# Patient Record
Sex: Female | Born: 1950
Health system: Southern US, Community
[De-identification: ages and names within clinical notes are randomized; demographics above are authoritative.]

## PROBLEM LIST (undated history)

## (undated) DIAGNOSIS — Z8719 Personal history of other diseases of the digestive system: Secondary | ICD-10-CM

## (undated) DIAGNOSIS — M1990 Unspecified osteoarthritis, unspecified site: Secondary | ICD-10-CM

## (undated) DIAGNOSIS — K219 Gastro-esophageal reflux disease without esophagitis: Secondary | ICD-10-CM

## (undated) DIAGNOSIS — R519 Headache, unspecified: Secondary | ICD-10-CM

## (undated) DIAGNOSIS — F419 Anxiety disorder, unspecified: Secondary | ICD-10-CM

## (undated) HISTORY — PX: OTHER SURGICAL HISTORY: SHX169

## (undated) HISTORY — PX: JOINT REPLACEMENT: SHX530

## (undated) HISTORY — PX: TOTAL HIP ARTHROPLASTY: SHX124

---

## 2003-07-11 ENCOUNTER — Inpatient Hospital Stay (HOSPITAL_COMMUNITY): Admission: RE | Admit: 2003-07-11 | Discharge: 2003-07-15 | Payer: Self-pay | Admitting: Orthopedic Surgery

## 2005-03-05 ENCOUNTER — Encounter: Admission: RE | Admit: 2005-03-05 | Discharge: 2005-03-05 | Payer: Self-pay | Admitting: Internal Medicine

## 2006-01-29 ENCOUNTER — Inpatient Hospital Stay (HOSPITAL_COMMUNITY): Admission: RE | Admit: 2006-01-29 | Discharge: 2006-02-01 | Payer: Self-pay | Admitting: Orthopedic Surgery

## 2008-08-03 ENCOUNTER — Encounter: Admission: RE | Admit: 2008-08-03 | Discharge: 2008-08-03 | Payer: Self-pay | Admitting: Unknown Physician Specialty

## 2008-10-27 ENCOUNTER — Emergency Department (HOSPITAL_COMMUNITY): Admission: EM | Admit: 2008-10-27 | Discharge: 2008-10-27 | Payer: Self-pay | Admitting: Emergency Medicine

## 2009-12-13 ENCOUNTER — Encounter: Admission: RE | Admit: 2009-12-13 | Discharge: 2009-12-13 | Payer: Self-pay | Admitting: Internal Medicine

## 2009-12-13 ENCOUNTER — Encounter (INDEPENDENT_AMBULATORY_CARE_PROVIDER_SITE_OTHER): Payer: Self-pay | Admitting: *Deleted

## 2010-01-03 ENCOUNTER — Encounter: Payer: Self-pay | Admitting: Gastroenterology

## 2010-01-04 ENCOUNTER — Ambulatory Visit: Payer: Self-pay | Admitting: Gastroenterology

## 2010-01-04 ENCOUNTER — Encounter (INDEPENDENT_AMBULATORY_CARE_PROVIDER_SITE_OTHER): Payer: Self-pay | Admitting: *Deleted

## 2010-01-04 DIAGNOSIS — R079 Chest pain, unspecified: Secondary | ICD-10-CM | POA: Insufficient documentation

## 2010-01-04 DIAGNOSIS — F329 Major depressive disorder, single episode, unspecified: Secondary | ICD-10-CM

## 2010-01-04 DIAGNOSIS — Z8739 Personal history of other diseases of the musculoskeletal system and connective tissue: Secondary | ICD-10-CM

## 2010-01-04 DIAGNOSIS — K5900 Constipation, unspecified: Secondary | ICD-10-CM | POA: Insufficient documentation

## 2010-01-04 DIAGNOSIS — K219 Gastro-esophageal reflux disease without esophagitis: Secondary | ICD-10-CM

## 2010-02-12 ENCOUNTER — Ambulatory Visit: Payer: Self-pay | Admitting: Gastroenterology

## 2010-02-12 DIAGNOSIS — K449 Diaphragmatic hernia without obstruction or gangrene: Secondary | ICD-10-CM | POA: Insufficient documentation

## 2010-02-12 LAB — CONVERTED CEMR LAB: UREASE: NEGATIVE

## 2010-02-21 ENCOUNTER — Telehealth: Payer: Self-pay | Admitting: Gastroenterology

## 2010-03-16 ENCOUNTER — Ambulatory Visit: Payer: Self-pay | Admitting: Gastroenterology

## 2010-03-16 DIAGNOSIS — K589 Irritable bowel syndrome without diarrhea: Secondary | ICD-10-CM

## 2010-06-04 ENCOUNTER — Encounter: Payer: Self-pay | Admitting: Gastroenterology

## 2010-07-31 NOTE — Miscellaneous (Signed)
Summary: Orders Update-Clotest  Clinical Lists Changes  Problems: Added new problem of HIATAL HERNIA (ICD-553.3) Orders: Added new Test order of TLB-H Pylori Screen Gastric Biopsy (83013-CLOTEST) - Signed 

## 2010-07-31 NOTE — Procedures (Signed)
Summary: Colonoscopy  Patient: [name] Note: All result statuses are Final unless otherwise noted.  Tests: (1) Colonoscopy (COL)   COL Colonoscopy           DONE     [facility]     [address]     [place], [place]  [address]           COLONOSCOPY PROCEDURE REPORT           PATIENT:  Melinda Thompson, Melinda Thompson  MR#:  [id number]     BIRTHDATE:  [birth year], 58 yrs. old  GENDER:  female     ENDOSCOPIST:  Vania Rea. Jarold Motto, MD, [facility]     REF. BY:  Guerry Bruin, M.D.     PROCEDURE DATE:  02/12/2010     PROCEDURE:  Higher-risk screening colonoscopy G0105           ASA CLASS:  Class II     INDICATIONS:  family Hx of polyps, Elevated Risk Screening     MEDICATIONS:   Fentanyl 75 mcg IV, Versed 8 mg IV           DESCRIPTION OF PROCEDURE:   After the risks benefits and     alternatives of the procedure were thoroughly explained, informed     consent was obtained.  Digital rectal exam was performed and     revealed no abnormalities.   The LB CF-H180AL [id number] endoscope     was introduced through the anus and advanced to the cecum, which     was identified by both the appendix and ileocecal valve, without     limitations.  The quality of the prep was excellent, using     MoviPrep.  The instrument was then slowly withdrawn as the colon     was fully examined.     <<PROCEDUREIMAGES>>           FINDINGS:  No polyps or cancers were seen.  no active bleeding or     blood in c.  This was otherwise a normal examination of the colon.     Retroflexed views in the rectum revealed no abnormalities.    The     scope was then withdrawn from the patient and the procedure     completed.           COMPLICATIONS:  None     ENDOSCOPIC IMPRESSION:     1) No polyps or cancers     2) No active bleeding or blood in c     3) Otherwise normal examination     RECOMMENDATIONS:     1) Continue current colorectal screening recommendations for     "routine risk" patients with a repeat colonoscopy in  10 years.     REPEAT EXAM:  No           ______________________________     Vania Rea. Jarold Motto, MD, [facility]           CC:           n.     eSIGNED:   Vania Rea. Patterson at 02/12/2010 11:23 AM           Johna Sheriff, [id number]  Note: An exclamation mark (!) indicates a result that was not dispersed into the flowsheet. Document Creation Date: 02/12/2010 11:24 AM _______________________________________________________________________  (1) Order result status: Final Collection or observation date-time: 02/12/2010 11:18 Requested date-time:  Receipt date-time:  Reported date-time:  Referring Physician:   Ordering Physician: Sheryn Bison [phone]) Specimen  Source:  Source: Launa Grill Order Number: [id number] Lab site:   Appended Document: Colonoscopy    Clinical Lists Changes  Observations: Added new observation of COLONNXTDUE: 01/2020 (02/12/2010 15:20)      Appended Document: Colonoscopy     Procedures Next Due Date:    Colonoscopy: 01/2020

## 2010-07-31 NOTE — Assessment & Plan Note (Signed)
Summary: f/u--ch.   History of Present Illness Visit Type: Follow-up Visit Primary GI MD: Sheryn Bison MD Faith Rogue Primary Skya Mccullum: Sarita Haver Requesting Breyona Swander: n/a Chief Complaint: Pt here to today to follow up, does not have any GI complaints at this time, Pt states she is trying to eat better History of Present Illness:   This Patient has acid reflux and is asymptomatic on omeprazole 20 mg a day. We reviewed her recent endoscopy. She does have chronic IBS and uses p.r.n. Levsin.She had a normal ultrasound exam in July of this year, and had a recent normal endoscopy and colonoscopy is set for moderate-sized hiatal hernia.   GI Review of Systems      Denies abdominal pain, acid reflux, belching, bloating, chest pain, dysphagia with liquids, dysphagia with solids, heartburn, loss of appetite, nausea, vomiting, vomiting blood, weight loss, and  weight gain.        Denies anal fissure, black tarry stools, change in bowel habit, constipation, diarrhea, diverticulosis, fecal incontinence, heme positive stool, hemorrhoids, irritable bowel syndrome, jaundice, light color stool, liver problems, rectal bleeding, and  rectal pain.    Current Medications (verified): 1)  Zoloft 50 Mg Tabs (Sertraline Hcl) .Marland Kitchen.. 1 Qd 2)  Sumatriptan Succinate 100 Mg Tabs (Sumatriptan Succinate) .... Prn 3)  Clonazepam 0.5 Mg Tbdp (Clonazepam) .... Two Times A Day Prn 4)  Maxalt 5 Mg Tabs (Rizatriptan Benzoate) .... Prn 5)  Multivitamins  Tabs (Multiple Vitamin) .... Qd 6)  Omeprazole 20 Mg Cpdr (Omeprazole) .Marland Kitchen.. 1 By Mouth Two Times A Day 7)  Levsin/sl 0.125 Mg  Subl (Hyoscyamine Sulfate) .Marland Kitchen.. 1 Sl Q 4-6 Hrs As Needed  Allergies (verified): 1)  ! Sulfa  Past History:  Family History: Last updated: 01/04/2010 Family History of Colon Polyps:father No FH of Colon Cancer: Family History of Colitis/Crohn's:father Family History of Heart Disease:  father  Social History: Last updated:  01/04/2010 Occupation: Chartered loss adjuster single Patient has never smoked.  Daily Caffeine Use  2 cups Illicit Drug Use - no  Past medical, surgical, family and social histories (including risk factors) reviewed for relevance to current acute and chronic problems.  Past Medical History: Reviewed history from 01/04/2010 and no changes required. Arthritis Migraine Headaches Anxiety Disorder Depression Urinary Tract Infection  Past Surgical History: Reviewed history from 01/04/2010 and no changes required. Hip Replacement  2 surgery  Family History: Reviewed history from 01/04/2010 and no changes required. Family History of Colon Polyps:father No FH of Colon Cancer: Family History of Colitis/Crohn's:father Family History of Heart Disease:  father  Social History: Reviewed history from 01/04/2010 and no changes required. 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  Daily Caffeine Use  2 cups Illicit Drug Use - no  Review of Systems  The patient denies allergy/sinus, anemia, anxiety-new, arthritis/joint pain, back pain, blood in urine, breast changes/lumps, change in vision, confusion, cough, coughing up blood, depression-new, fainting, fatigue, fever, headaches-new, hearing problems, heart murmur, heart rhythm changes, itching, menstrual pain, muscle pains/cramps, night sweats, nosebleeds, pregnancy symptoms, shortness of breath, skin rash, sleeping problems, sore throat, swelling of feet/legs, swollen lymph glands, thirst - excessive , urination - excessive , urination changes/pain, urine leakage, vision changes, and voice change.    Vital Signs:  Patient profile:   60 year old female Height:      67 inches Weight:      201 pounds BMI:     31.59 BSA:     2.03 Pulse rate:   80 / minute  Pulse rhythm:   regular BP sitting:   120 / 82  (left arm)  Vitals Entered By: Merri Ray CMA Duncan Dull) (March 16, 2010 10:40 AM)  Physical Exam  General:  Well  developed, well nourished, no acute distress.healthy appearing.   Head:  Normocephalic and atraumatic. Eyes:  PERRLA, no icterus.exam deferred to patient's ophthalmologist.   Abdomen:  Soft, nontender and nondistended. No masses, hepatosplenomegaly or hernias noted. Normal bowel sounds. Extremities:  No clubbing, cyanosis, edema or deformities noted. Neurologic:  Alert and  oriented x4;  grossly normal neurologically. Psych:  Alert and cooperative. Normal mood and affect.   Impression & Recommendations:  Problem # 1:  GERD (ICD-530.81) Assessment Improved Reflux regime reviewed and will continue omeprazole 20 mg one to 2 times a day as needed to control her symptomatology. Orders: TLB-IgA (Immunoglobulin A) (82784-IGA) T-Sprue Panel (Celiac Disease Aby Eval) (83516x3/[address])  Problem # 2:  IRRITABLE BOWEL SYNDROME (ICD-564.1) Assessment: Improved Continue p.r.n. Levsin. Celiac antibodies have been ordered. She Had Been Advised to avoid sorbitol and fructose in her diet  Patient Instructions: 1)  Copy sent to : Richard Tisovec,MD 2)  Please schedule a follow-up appointment as needed.  3)  Please continue current medications.  4)  Please go to the basement today for your labs.  5)  The medication list was reviewed and reconciled.  All changed / newly prescribed medications were explained.  A complete medication list was provided to the patient / caregiver.

## 2010-07-31 NOTE — Letter (Signed)
Summary: [facility] Instructions  [facility]  [address] [place], [place] [address]   Phone: [phone]  Fax: [phone]       Melinda Thompson    [birth year]    MRN: [id number]        Procedure Day /Date: Monday,  02/12/10     Arrival Time: 10:00      Procedure Time: 11:00     Location of Procedure:                    [name] (4th Floor)                        PREPARATION FOR COLONOSCOPY WITH MOVIPREP   Starting 5 days prior to your procedure 02/07/10 do not eat nuts, seeds, popcorn, corn, beans, peas,  salads, or any raw vegetables.  Do not take any fiber supplements (e.g. Metamucil, Citrucel, and Benefiber).  THE DAY BEFORE YOUR PROCEDURE         DATE: 02/11/10    DAY: Sunday  1.  Drink clear liquids the entire day-NO SOLID FOOD  2.  Do not drink anything colored red or purple.  Avoid juices with pulp.  No orange juice.  3.  Drink at least 64 oz. (8 glasses) of fluid/clear liquids during the day to prevent dehydration and help the prep work efficiently.  CLEAR LIQUIDS INCLUDE: Water Jello Ice Popsicles Tea (sugar ok, no milk/cream) Powdered fruit flavored drinks Coffee (sugar ok, no milk/cream) Gatorade Juice: apple, white grape, white cranberry  Lemonade Clear bullion, consomm, broth Carbonated beverages (any kind) Strained chicken noodle soup Hard Candy                             4.  In the morning, mix first dose of MoviPrep solution:    Empty 1 Pouch A and 1 Pouch B into the disposable container    Add lukewarm drinking water to the top line of the container. Mix to dissolve    Refrigerate (mixed solution should be used within 24 hrs)  5.  Begin drinking the prep at 5:00 p.m. The MoviPrep container is divided by 4 marks.   Every 15 minutes drink the solution down to the next mark (approximately 8 oz) until the full liter is complete.   6.  Follow completed prep with 16 oz of clear liquid of your choice (Nothing  red or purple).  Continue to drink clear liquids until bedtime.  7.  Before going to bed, mix second dose of MoviPrep solution:    Empty 1 Pouch A and 1 Pouch B into the disposable container    Add lukewarm drinking water to the top line of the container. Mix to dissolve    Refrigerate  THE DAY OF YOUR PROCEDURE      DATE: 02/12/10    DAY: Monday  Beginning at 6:00 a.m. (5 hours before procedure):         1. Every 15 minutes, drink the solution down to the next mark (approx 8 oz) until the full liter is complete.  2. Follow completed prep with 16 oz. of clear liquid of your choice.    3. You may drink clear liquids until 8:00 (2 HOURS BEFORE PROCEDURE).   MEDICATION INSTRUCTIONS  Unless otherwise instructed, you should take regular prescription medications with a small sip of water   as early as  possible the morning of your procedure.  .    Additional medication instructions: _         OTHER INSTRUCTIONS  You will need a responsible adult at least 59 years of age to accompany you and drive you home.   This person must remain in the waiting room during your procedure.  Wear loose fitting clothing that is easily removed.  Leave jewelry and other valuables at home.  However, you may wish to bring a book to read or  an iPod/MP3 player to listen to music as you wait for your procedure to start.  Remove all body piercing jewelry and leave at home.  Total time from sign-in until discharge is approximately 2-3 hours.  You should go home directly after your procedure and rest.  You can resume normal activities the  day after your procedure.  The day of your procedure you should not:   Drive   Make legal decisions   Operate machinery   Drink alcohol   Return to work  You will receive specific instructions about eating, activities and medications before you leave.    The above instructions have been reviewed and explained to me by    _______________________    I fully understand and can verbalize these instructions _____________________________ Date _________

## 2010-07-31 NOTE — Progress Notes (Signed)
Summary: Follow up appt  Phone Note Outgoing Call   Call placed by: Ashok Cordia RN,  February 21, 2010 11:56 AM Summary of Call: Does pt need follow up OV?  See procedure reports. Initial call taken by: Ashok Cordia RN,  February 21, 2010 11:56 AM  Follow-up for Phone Call        YES Follow-up by: Mardella Layman MD Clementeen Graham,  February 21, 2010 12:06 PM     Appended Document: Triage LM for pt to call to sch follow up appt in 2-3 weeks.  Appended Document: Triage LM for pt to call and sch appt.  2-3 week follow up  Appended Document: Follow up appt Letter sent asking pt to call for appt.

## 2010-07-31 NOTE — Medication Information (Signed)
Summary: Prior Authorization Approved for Omeprazole / [employer] [place]  Prior Authorization Approved for Omeprazole / [employer] [place]   Imported By: Lennie Odor 06/07/2010 16:06:50  _____________________________________________________________________  External Attachment:    Type:   Image     Comment:   External Document

## 2010-07-31 NOTE — Procedures (Signed)
Summary: Upper Endoscopy  Patient: Melinda Thompson Note: All result statuses are Final unless otherwise noted.  Tests: (1) Upper Endoscopy (EGD)   EGD Upper Endoscopy       DONE      [facility]     [address]     [place], [place]  [address]           ENDOSCOPY PROCEDURE REPORT           PATIENT:  [name], [name]  MR#:  [id number]     BIRTHDATE:  February 10, 1951, 58 yrs. old  GENDER:  female           ENDOSCOPIST:  Vania Rea. Jarold Motto, MD, [facility]     Referred by:  Guerry Bruin, M.D.           PROCEDURE DATE:  02/12/2010     PROCEDURE:  EGD with biopsy     ASA CLASS:  Class II     INDICATIONS:  chest pain           MEDICATIONS:   There was residual sedation effect present from     prior procedure., Versed 2 mg IV     TOPICAL ANESTHETIC:           DESCRIPTION OF PROCEDURE:   After the risks benefits and     alternatives of the procedure were thoroughly explained, informed     consent was obtained.  The LB GIF-H180 [id number] endoscope was     introduced through the mouth and advanced to the second portion of     the duodenum, without limitations.  The instrument was slowly     withdrawn as the mucosa was fully examined.     <<PROCEDUREIMAGES>>           A hiatal hernia was found. 5 CM PROMINANT HIATIAL HERNIA PRESENT.     Normal duodenal folds were noted.  The stomach was entered and     closely examined. The antrum, angularis, and lesser curvature were     well visualized, including a retroflexed view of the cardia and     fundus. The stomach wall was normally distensable. The scope     passed easily through the pylorus into the duodenum. CLO BX. DONE.     The esophagus and gastroesophageal junction were completely normal     in appearance. NO EROSIONS,STRICTURE,OR BARRETT'S MUCOSA NOTED.     Retroflexed views revealed a hiatal hernia.    The scope was then     withdrawn from the patient and the procedure completed.           COMPLICATIONS:  None           ENDOSCOPIC  IMPRESSION:     1) Hiatal hernia     2) Normal duodenal folds     3) Normal stomach     4) Normal esophagus     5) A hiatal hernia     CHRONIC GERD.PT NOW ON PPI RX.     RECOMMENDATIONS:     1) Anti-reflux regimen to be follow     2) Await pathology results     3) continue PPI           REPEAT EXAM:  No           ______________________________     Vania Rea. Jarold Motto, MD, [facility]           CC:           n.  eSIGNED:   Vania Rea. Patterson at 02/12/2010 11:34 AM           Johna Sheriff, [id number]  Note: An exclamation mark (!) indicates a result that was not dispersed into the flowsheet. Document Creation Date: 02/12/2010 11:35 AM _______________________________________________________________________  (1) Order result status: Final Collection or observation date-time: 02/12/2010 11:28 Requested date-time:  Receipt date-time:  Reported date-time:  Referring Physician:   Ordering Physician: Sheryn Bison [phone]) Specimen Source:  Source: Launa Grill Order Number: [id number] Lab site:

## 2010-07-31 NOTE — Assessment & Plan Note (Signed)
Summary: abd pain.Melinda Thompson   History of Present Illness Visit Type: Initial Consult Primary GI MD: Sheryn Bison MD FACP FAGA Primary Provider: Sarita Haver Requesting Provider: Sarita Haver Chief Complaint: Abdominal Pain History of Present Illness:   60 year old Caucasian female referred by Dr. Neale Burly for evaluation of recurrent substernal chest pain, gas, bloating, and chronic functional constipation.  Melinda Thompson has had years of acid reflux exacerbated by frequent NSAID use related to degenerative arthritis and 2 previous hip replacements. She has had worsening of her complaints since June of this year when apparently she enters on an alleged food poisoning of questionable etiology. Apparently, her son was hospitalized at  [facility]. Diagnosis apparently was unclear.  The patient has had chronic burning substernal chest pain and has been on PPI therapy for several months. She has no dysphagia or true hepatobiliary complaints. Recent lab work is pending, and ultrasound upper abdomen was unremarkable. She also has chronic headaches and uses p.r.n. Imitrex, clonazepam, Maxalt, and daily Zoloft. She denies current neurologic problems.  Her appetite is good and her weight is stable. She does describe some early satiety but has had no weight loss. She has chronic functional constipation with associated gas and bloating. Family history vaguely positive for possible colitis and possible polyposis in her father. The patient has not had previous endoscopy, colonoscopy, or barium studies. She does not smoke or abuse ethanol. She continues to take periodic Advil for hip pain.   GI Review of Systems    Reports abdominal pain, acid reflux, belching, bloating, chest pain, and  weight gain.      Denies dysphagia with liquids, dysphagia with solids, heartburn, loss of appetite, nausea, vomiting, vomiting blood, and  weight loss.      Reports change in bowel habits and  constipation.    Denies anal fissure, black tarry stools, diarrhea, diverticulosis, fecal incontinence, heme positive stool, hemorrhoids, irritable bowel syndrome, jaundice, light color stool, liver problems, rectal bleeding, and  rectal pain. Preventive Screening-Counseling & Management  Alcohol-Tobacco     Smoking Status: never      Drug Use:  no.      Current Medications (verified): 1)  Zoloft 50 Mg Tabs (Sertraline Hcl) .Melinda Kitchen.. 1 Qd 2)  Sumatriptan Succinate 100 Mg Tabs (Sumatriptan Succinate) .... Prn 3)  Clonazepam 0.5 Mg Tbdp (Clonazepam) .... Two Times A Day Prn 4)  Maxalt 5 Mg Tabs (Rizatriptan Benzoate) .... Prn 5)  Multivitamins  Tabs (Multiple Vitamin) .... Qd 6)  Omeprazole 20 Mg Cpdr (Omeprazole) .Melinda Kitchen.. 1 Qd  Allergies (verified): 1)  ! Sulfa  Past History:  Past medical, surgical, family and social histories (including risk factors) reviewed for relevance to current acute and chronic problems.  Past Medical History: Arthritis Migraine Headaches Anxiety Disorder Depression Urinary Tract Infection  Past Surgical History: Hip Replacement  2 surgery  Family History: Reviewed history and no changes required. Family History of Colon Polyps:father No FH of Colon Cancer: Family History of Colitis/Crohn's:father Family History of Heart Disease:  father  Social History: Reviewed history and no changes required. 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  Daily Caffeine Use  2 cups Illicit Drug Use - no Smoking Status:  never Drug Use:  no  Review of Systems       The patient complains of arthritis/joint pain, back pain, and headaches-new.  The patient denies allergy/sinus, anemia, anxiety-new, blood in urine, breast changes/lumps, change in vision, confusion, cough, coughing up blood, depression-new, fainting, fatigue, fever, hearing problems, heart murmur,  heart rhythm changes, itching, menstrual pain, muscle pains/cramps, night sweats, nosebleeds, pregnancy  symptoms, shortness of breath, skin rash, sleeping problems, sore throat, swelling of feet/legs, swollen lymph glands, thirst - excessive , urination - excessive , urination changes/pain, urine leakage, vision changes, and voice change.   General:  Denies fever, chills, sweats, anorexia, fatigue, weakness, malaise, weight loss, and sleep disorder. Eyes:  Denies blurring, diplopia, irritation, discharge, vision loss, scotoma, eye pain, and photophobia. ENT:  Denies earache, ear discharge, tinnitus, decreased hearing, nasal congestion, loss of smell, nosebleeds, sore throat, hoarseness, and difficulty swallowing. CV:  Denies chest pains, angina, palpitations, syncope, dyspnea on exertion, orthopnea, PND, peripheral edema, and claudication. Resp:  Denies dyspnea at rest, dyspnea with exercise, cough, sputum, wheezing, coughing up blood, and pleurisy. GI:  Complains of indigestion/heartburn, gas/bloating, and constipation; denies difficulty swallowing, pain on swallowing, nausea, vomiting, vomiting blood, abdominal pain, jaundice, diarrhea, change in bowel habits, bloody BM's, black BMs, and fecal incontinence. GU:  Denies urinary burning, blood in urine, nocturnal urination, urinary frequency, urinary incontinence, abnormal vaginal bleeding, amenorrhea, menorrhagia, vaginal discharge, pelvic pain, genital sores, painful intercourse, and decreased libido. MS:  Complains of joint pain / LOM, joint stiffness, and low back pain; denies joint swelling, joint deformity, muscle weakness, muscle cramps, muscle atrophy, leg pain at night, leg pain with exertion, and shoulder pain / LOM hand / wrist pain (CTS). Derm:  Denies rash, itching, dry skin, hives, moles, warts, and unhealing ulcers. Neuro:  Complains of frequent headaches; denies weakness, paralysis, abnormal sensation, seizures, syncope, tremors, vertigo, transient blindness, frequent falls, difficulty walking, headache, sciatica, radiculopathy other:,  restless legs, memory loss, and confusion. Psych:  Complains of depression and anxiety; denies memory loss, suicidal ideation, hallucinations, paranoia, phobia, and confusion. Heme:  Denies bruising, bleeding, enlarged lymph nodes, and pagophagia.  Vital Signs:  Patient profile:   60 year old female Height:      67 inches Weight:      200 pounds BMI:     31.44 BSA:     2.02 Pulse rate:   84 / minute Pulse rhythm:   regular BP sitting:   110 / 82  (left arm)  Vitals Entered By: Merri Ray CMA Duncan Dull) (January 04, 2010 9:06 AM)  Physical Exam  General:  Well developed, well nourished, no acute distress.healthy appearing.   Head:  Normocephalic and atraumatic. Eyes:  PERRLA, no icterus.exam deferred to patient's ophthalmologist.   Neck:  Supple; no masses or thyromegaly. Lungs:  Clear throughout to auscultation. Heart:  Regular rate and rhythm; no murmurs, rubs,  or bruits. Abdomen:  Soft, nontender and nondistended. No masses, hepatosplenomegaly or hernias noted. Normal bowel sounds. Rectal:  deferred until time of colonoscopy.   Msk:  Symmetrical with no gross deformities. Normal posture. Pulses:  Normal pulses noted. Extremities:  No clubbing, cyanosis, edema or deformities noted. Neurologic:  Alert and  oriented x4;  grossly normal neurologically. Cervical Nodes:  No significant cervical adenopathy. Psych:  Alert and cooperative. Normal mood and affect.   Impression & Recommendations:  Problem # 1:  CHEST PAIN (ICD-786.50) Assessment Improved Probable acute and chronic gastroesophageal reflux disease-rule out Barrett's mucosa. I have increased her omeprazole to b.i.d. dosage with standard antireflux maneuvers. Endoscopy is been scheduled her convenience.  Problem # 2:  SCREENING COLORECTAL-CANCER (ICD-V76.51) Assessment: Unchanged colonoscopy also scheduled at her convenience.  Problem # 3:  CONSTIPATION (ICD-564.00) Assessment: Unchanged She has constipation  predominant IBS which should respond to add fiber diet. Labs from  Dr.Tisovec are pending.  Problem # 4:  DEPRESSION (ICD-311) Assessment: Improved continue Zoloft 50 mg a day as tolerated  Problem # 5:  PERSONAL HISTORY OF ARTHRITIS (ICD-V13.4) Assessment: Unchanged Patient continues to use NSAIDs with some improvement in her arthritic complaints. She is followed by Dr. Lestine Mount in orthopedics.  Patient Instructions: 1)  Increase Omeprazole to two times a day. 2)  Begin Levsin as needed. 3)  You are scheduled for an endoscopy and a colonoscopy. 4)  The medication list was reviewed and reconciled.  All changed / newly prescribed medications were explained.  A complete medication list was provided to the patient / caregiver. 5)  Copy sent to : Dr. Roland Rack and Dr. Rolan Bucco in [place] [facility]. 6)  Please continue current medications.  7)  Avoid foods high in acid content ( tomatoes, citrus juices, spicy foods) . Avoid eating within 3 to 4 hours of lying down or before exercising. Do not over eat; try smaller more frequent meals. Elevate head of bed four inches when sleeping.  8)  High Fiber, Low Fat  Healthy Eating Plan brochure given.  9)  Constipation and Hemorrhoids brochure given.  10)  Colonoscopy and Flexible Sigmoidoscopy brochure given.  11)  Conscious Sedation brochure given.  12)  Upper Endoscopy brochure given.   Appended Document: abd pain.Melinda Thompson    Clinical Lists Changes  Medications: Added new medication of MOVIPREP 100 GM  SOLR (PEG-KCL-NACL-NASULF-NA ASC-C) As per prep instructions. - Signed Changed medication from OMEPRAZOLE 20 MG CPDR (OMEPRAZOLE) 1 qd to OMEPRAZOLE 20 MG CPDR (OMEPRAZOLE) 1 by mouth two times a day - Signed Added new medication of LEVSIN/SL 0.125 MG  SUBL (HYOSCYAMINE SULFATE) 1 SL q 4-6 hrs as needed - Signed Rx of MOVIPREP 100 GM  SOLR (PEG-KCL-NACL-NASULF-NA ASC-C) As per prep instructions.;  #1 x 0;  Signed;  Entered by: Ashok Cordia RN;  Authorized by: Mardella Layman MD [facility];  Method used: Electronically to [id number] [address] [place], [place]  [address], Ph: [phone] or [phone], Fax: [phone] Rx of OMEPRAZOLE 20 MG CPDR (OMEPRAZOLE) 1 by mouth two times a day;  #60 x 11;  Signed;  Entered by: Ashok Cordia RN;  Authorized by: Mardella Layman MD [facility];  Method used: Electronically to [facility] [address], [place], [place]  [address], Ph: [phone] or [phone], Fax: [phone] Rx of LEVSIN/SL 0.125 MG  SUBL (HYOSCYAMINE SULFATE) 1 SL q 4-6 hrs as needed;  #60 x 3;  Signed;  Entered by: Ashok Cordia RN;  Authorized by: Mardella Layman MD [facility];  Method used: Electronically to [facility] [address] [place], [place]  [address], Ph: [phone] or [phone], Fax: [phone] Orders: Added new Test order of Colon/Endo (Colon/Endo) - Signed    Prescriptions: LEVSIN/SL 0.125 MG  SUBL (HYOSCYAMINE SULFATE) 1 SL q 4-6 hrs as needed  #60 x 3   Entered by:   Ashok Cordia RN   Authorized by:   Mardella Layman MD [facility]   Signed by:   Ashok Cordia RN on 01/04/2010   Method used:   Electronically to        [facility] (retail)       [address]       [place], [place]  [address]       Ph: [phone] or [phone]       Fax: [phone]   RxID:   [id number] OMEPRAZOLE 20 MG CPDR (OMEPRAZOLE)  1 by mouth two times a day  #60 x 11   Entered by:   Ashok Cordia RN   Authorized by:   Mardella Layman MD [facility]   Signed by:   Ashok Cordia RN on 01/04/2010   Method used:   Electronically to        [facility] (retail)       [address]       [place], [place]  [address]       Ph: [phone] or [phone]       Fax: [phone]   RxID:   [id number] MOVIPREP 100 GM  SOLR (PEG-KCL-NACL-NASULF-NA ASC-C) As per prep instructions.  #1 x 0   Entered by:   Ashok Cordia RN   Authorized by:   Mardella Layman MD [facility]   Signed by:   Ashok Cordia RN on  01/04/2010   Method used:   Electronically to        [facility] (retail)       [address]       [place], [place]  [address]       Ph: [phone] or [phone]       Fax: [phone]   RxID:   [id number]

## 2010-10-10 LAB — CBC
Hemoglobin: 13.9 g/dL (ref 12.0–15.0)
MCHC: 34.2 g/dL (ref 30.0–36.0)
RDW: 13.6 % (ref 11.5–15.5)

## 2010-10-10 LAB — BASIC METABOLIC PANEL
CO2: 26 mEq/L (ref 19–32)
Calcium: 9.7 mg/dL (ref 8.4–10.5)
Creatinine, Ser: 0.7 mg/dL (ref 0.4–1.2)
Glucose, Bld: 119 mg/dL — ABNORMAL HIGH (ref 70–99)

## 2010-10-10 LAB — DIFFERENTIAL
Basophils Absolute: 0 10*3/uL (ref 0.0–0.1)
Basophils Relative: 0 % (ref 0–1)
Eosinophils Absolute: 0 10*3/uL (ref 0.0–0.7)
Monocytes Absolute: 0.3 10*3/uL (ref 0.1–1.0)
Neutro Abs: 7.3 10*3/uL (ref 1.7–7.7)
Neutrophils Relative %: 89 % — ABNORMAL HIGH (ref 43–77)

## 2010-11-16 NOTE — Op Note (Signed)
NAME:  Melinda Thompson, Melinda Thompson                         ACCOUNT NO.:  [id number]   MEDICAL RECORD NO.:  [id number]                   PATIENT TYPE:  INP   LOCATION:  0004                                 FACILITY:  [facility]   PHYSICIAN:  Ollen Gross, M.D.                 DATE OF BIRTH:  [birth year]   DATE OF PROCEDURE:  07/11/2003  DATE OF DISCHARGE:                                 OPERATIVE REPORT   PREOPERATIVE DIAGNOSIS:  Osteoarthritis/dysplasia, right hip.   POSTOPERATIVE DIAGNOSIS:  Osteoarthritis/dysplasia, right hip.   PROCEDURE:  Right total hip arthroplasty.   SURGEON:  Ollen Gross, M.D.   ASSISTANT:  Alexzandrew L. Julien Girt, P.A.   ANESTHESIA:  General.   ESTIMATED BLOOD LOSS:  800.   DRAINS:  Hemovac x1.   COMPLICATIONS:  __________.   BRIEF CLINICAL NOTE:  Melinda Thompson is a 61 year old female with end-stage  arthritic changes of her right hip.  The underlying abnormality is  dysplasia.  She is bone-against-bone with a shallow acetabular socket.  She  has had pain refractory to nonoperative management and presents now for a  total hip arthroplasty.   PROCEDURE IN DETAIL:  After successful administration of general anesthetic,  the patient is placed in the left lateral decubitus position with the right  side up and held with the hip positioner.  The right lower extremity was  isolated from her perineum with plastic drapes and prepped and draped in the  usual sterile fashion.  A short posterolateral incision was made with a 10  blade through subcutaneous tissue to the level of the fascia lata, which was  incised in line with the skin incision.  Sciatic nerve was palpated and  protected and short rotators isolated off the femur.  A capsulectomy was  performed and the hip dislocated.  The center of the femoral head was  marked, and a trial prosthesis was placed at the center of the trial head to  lie slightly below the center of her native femoral head.  I did it this way  because  she had everted superiorly, and when I brought the acetabular center  down, I did not want to overlengthen her.  The femoral neck was cut and  femoral head removed.  The femurs were retracted anteriorly to gain  acetabular exposure.   Acetabular reaming was initiated with a 47 coursing increments up to a 55.  She had two large superior cysts, which were curetted out, and some of the  reamings of cancellous bone were placed into those cysts.  The 56 mm  Pinnacle acetabular shell was placed into anatomic position with  approximately 40 degrees of anteversion, 20 degrees of forward flexion.  We  had excellent press fit, and I also used two dome screws for additional  purchase.  A trial 36 mm neutral liner was placed.   The femur was prepared, first with a canal finder and then irrigation.  Axial  reaming was performed to 13.5 mm and proximal reaming to an 27F and the  sleeving machine to a small.  27F small trial sleeve was placed with an 18 x  13 stem, first a 36 standard neck, matching her native anteversion.  With a  36 standard, she did not have enough offset.  I went to a 36 + 8, and she  had much more appropriate soft tissue tension.  With the 36 + 8 and a 36 + 0  head, there was still a little bit of laxity, so I went to a 36 + 3, which  corrected that.  She had excellent stability with full extension and full  external rotation, 70 degrees flexion and 40 degrees adduction, 90 degrees  internal rotation and 90 degrees flexion, 90 degrees internal rotation.  I  could also flex her up to about 120 degrees.  By placing the right leg on  top of the left, her leg lengths were equal.  The trials were then removed,  and the permanent apex hole eliminator placed into the acetabular shell.  Permanent 36 mm neutral Ultramet narrow liner was placed into the shell.  This was a metal-on-metal hip replacement. 27F small sleeve and 18 x 13 stem  and a 36 + 8 neck were placed, and I matched her native  anteversion.  Once  the stem was fully impacted, then the trial 36 + 3 was placed and I liked  the stability with that.  The permanent 36 + 3 was placed, and the same  stability parameters were present as with the trials.  The wound was then  copiously irrigated with antibiotic solution, and rotators were reattached  to the femur through drill holes with Ethibond suture.  The fascia lata was  closed over a Hemovac drain with interrupted #1 Vicryl subcu closed with #1,  and then 2-0 Vicryl, and then subcuticular running 4-0 Monocryl.  The  incision was clean and dry, and Steri-Strips and a bulky sterile dressing  applied.  Subsequently awakened and transported to recovery in a stable  condition.                                               Ollen Gross, M.D.    FA/MEDQ  D:  07/11/2003  T:  07/11/2003  Job:  [id number]

## 2010-11-16 NOTE — Discharge Summary (Signed)
NAME:  Melinda Thompson, Melinda Thompson               ACCOUNT NO.:  [id number]   MEDICAL RECORD NO.:  [id number]          PATIENT TYPE:  INP   LOCATION:  1506                         FACILITY:  [facility]   PHYSICIAN:  Ollen Gross, M.D.    DATE OF BIRTH:  [birth year]   DATE OF ADMISSION:  01/29/2006  DATE OF DISCHARGE:  02/01/2006                                 DISCHARGE SUMMARY   ADMISSION DIAGNOSIS:  1. Osteoarthritis of the left hip.  2. History of migraines.  3. History of bronchitis.  4. Hiatal hernia.  5. History of urinary tract infections.  6. Status post right total hip January 2005.   DISCHARGE DIAGNOSIS:  1. Osteoarthritis of the left hip status post left total hip arthroplasty.  2. Postoperative hypokalemia, improving.  3. History of migraines.  4. History of bronchitis.  5. Hiatal hernia.  6. History of urinary tract infections.  7. Status post right total hip January 2005.   PROCEDURE:  January 29, 2006, left total hip, surgeon Dr. Lequita Halt, assistant  Avel Peace, P.A.-C.   CONSULTATIONS:  None.   BRIEF HISTORY:  Jenette is a 60 year old female with a rapidly progressive  arthritic process of the left hip, previous successful right total hip, who  now presents for left total hip.   LABORATORY DATA:  Preop CBC with hemoglobin 12.9, hematocrit 38.1, white  cell count 5.8.  Postop hemoglobin 10.4 drifted down to 10.1.  The last H&H  10.2 and 28.8.  PT and PTT preop 12.8 and 28 respectively with INR 1.  Serial protimes were followed, last PT and INR 20.8 and 1.7.  Chem panel on  admission were all within normal limits.  Potassium did drop from 3.7 to 3.4  and came back at 4.1.  This was followed with serial BMETs.  Urine pregnancy  was negative.  Preop UA negative.  EKG in July 2007, normal sinus rhythm,  low voltage QRS unconfirmed.  Two view chest January 27, 2006, no acute  findings.   HOSPITAL COURSE:  The patient was admitted to [facility],  tolerated the procedure well,  transferred to recovery room, then orthopedic  floor.  Started on PCA pain control following surgery.  Hemovac drain was  pulled on day one.  Started  getting up with physical therapy.  PCA was  discontinued, weaned over to p.o. meds.  By day two, she was a little more  stiff but actually was able to get up with physical therapy and ambulate  greater than 200 feet.  On postop day two, she was doing extremely well,  weaned over to p.o. meds, felt to be doing well, and was ready to go home by  the following day, February 01, 2006.   DISCHARGE PLAN:  The patient is discharged home on February 01, 2006.  Diet  resume home diet.  Activity partial weight bearing 25-50% left lower  extremity.  Hip precautions total protocol.  Home health nursing.   DISCHARGE MEDICATIONS:  Coumadin, Percocet, Robaxin.   DISPOSITION:  Home.   FOLLOW UP:  Thursday or Friday following discharge.   CONDITION ON  DISCHARGE:  Improved.      Alexzandrew L. Julien Girt, P.A.      Ollen Gross, M.D.  Electronically Signed    ALP/MEDQ  D:  03/06/2006  T:  03/06/2006  Job:  [id number]   cc:   Gaspar Garbe, M.D.  Fax: [phone]

## 2010-11-16 NOTE — H&P (Signed)
NAME:  Melinda Thompson, Melinda Thompson               ACCOUNT NO.:  [id number]   MEDICAL RECORD NO.:  [id number]          PATIENT TYPE:  INP   LOCATION:  NA                             FACILITY:   PHYSICIAN:  Ollen Gross, M.D.    DATE OF BIRTH:  05-14-1951   DATE OF ADMISSION:  01/29/2006  DATE OF DISCHARGE:                                HISTORY & PHYSICAL   OFFICE VISIT/HISTORY AND PHYSICAL   CHIEF COMPLAINT:  Left hip pain.   HISTORY OF PRESENT ILLNESS:  The patient is a 60 year old female, well known  to Dr. Ollen Gross, having previously undergone a right total hip back in  January of 2005 and has done extremely well with her previous hip.  The left  hip has known arthritis and has been progressive in nature and has reached  the point where she would like to have something done about.  The risks and  benefits were discussed, and the patient is subsequently admitted to the  hospital.   ALLERGIES:  No known drug allergies.   INTOLERANCES:  Intolerance to MORPHINE causing sickness and headaches.   CURRENT MEDICATIONS:  Maxalt p.r.n. migraines, Vicodin, occasional Advil,  and hydrochlorothiazide as needed.   PAST MEDICAL HISTORY:  1. Migraines.  2. History of bronchitis.  3. Hiatal hernia.  4. History of UTIs.  5. Arthritis.   PAST SURGICAL HISTORY:  1. Cataract surgery, July of 2004.  2. Right total hip, January of 2005.   SOCIAL HISTORY:  Divorced, she works as a Transport planner at one of [employer], nonsmoker, occasional intake of alcohol, one son.   FAMILY HISTORY:  Father with a history of heart disease, mother with a  history of osteoporosis and arthritis.   REVIEW OF SYSTEMS:  GENERAL:  No fevers, chills, or night sweats.  NEURO:  No seizures, syncope, or paralysis.  RESPIRATORY:  No shortness of breath,  productive cough, or hemoptysis.  CARDIOVASCULAR:  No chest pain, angina, or  orthopnea.  GI:  No nausea or vomiting, diarrhea or constipation.  GU:  No  dysuria, hematuria, or discharge.  MUSCULOSKELETAL:  Left hip.   PHYSICAL EXAMINATION:  VITAL SIGNS:  Pulse 76, respirations 12, blood  pressure 118/78.  GENERAL:  A 60 year old white female, well-nourished, well-developed, in no  acute distress.  She is alert and oriented, cooperative, and pleasant at  time of exam, excellent historian.  HEENT:  Normocephalic, atraumatic, pupils were round and reactive,  oropharynx clear, EOM's intact.  NECK:  Supple, no carotid bruits.  CHEST:  Clear, anterior-posterior chest wall.  There was no rhonchi, rales,  or wheezing.  HEART:  Regular rate and rhythm without murmur.  ABDOMEN:  Soft, slightly round, nontender, bowel sounds present.  RECTAL, BREASTS, GENITALIA:  Not done, not pertinent to present illness.  EXTREMITIES:  Left hip:  The left hip shows flexion of 90 degrees, external  rotation 10, internal rotation of 0, abduction of 10, antalgic gait.   IMPRESSION:  1. Osteoarthritis, left hip.  2. History of migraines.  3. History of bronchitis.  4. Hiatal hernia.  5. History of UTIs.  6. Status post right total hip, January of 2005.   PLAN:  The patient is admitted to [facility] to undergo a left  total hip arthroplasty.  Surgery will be performed by Dr. Ollen Gross.      Alexzandrew L. Julien Girt, P.A.      Ollen Gross, M.D.  Electronically Signed    ALP/MEDQ  D:  01/26/2006  T:  01/26/2006  Job:  [id number]   cc:   Gaspar Garbe, M.D.  Fax: [phone]

## 2010-11-16 NOTE — Op Note (Signed)
NAME:  Melinda Thompson, [name]               ACCOUNT NO.:  [id number]   MEDICAL RECORD NO.:  [id number]          PATIENT TYPE:  INP   LOCATION:  NA                           FACILITY:  [facility]   PHYSICIAN:  Ollen Gross, M.D.    DATE OF BIRTH:  11-08-1950   DATE OF PROCEDURE:  01/29/2006  DATE OF DISCHARGE:                                 OPERATIVE REPORT   PREOPERATIVE DIAGNOSIS:  Osteoarthritis left hip.   POSTOPERATIVE DIAGNOSIS:  Osteoarthritis left hip.   PROCEDURE:  Left total hip arthroplasty.   SURGEON:  Dr. Lequita Halt.   ASSISTANT:  Avel Peace.   ANESTHESIA:  General.   ESTIMATED BLOOD LOSS:  500.   DRAINS:  Hemovac times one.   COMPLICATIONS:  None.   CONDITION:  Stable to recovery.   CLINICAL NOTE:  Melinda Thompson is a 60 year old female who has had a rapidly  progressive arthritic process in her left hip.  She had a previous  successful right total hip arthroplasty and presents now for left total hip  arthroplasty.   PROCEDURE IN DETAIL:  After successful administration of general anesthetic,  the patient is placed in the right lateral decubitus position with the left  side up and held with the hip positioner.  The left lower extremity is  isolated from her perineum with plastic drapes, and prepped and draped in  the usual sterile fashion.  Standard posterolateral incision is made, with a  10-blade, through subcutaneous tissue to the level of fascia lata, which is  incised in line with the skin incision.  The sciatic nerve is palpated and  protected, and the short rotators isolated off the femur.  Capsulectomy is  performed and the hip is dislocated.  The center of femoral head is marked  and a trial prosthesis placed such that the center of the trial head  corresponds to the center of her native femoral head.  Osteotomy line is  marked on the femoral neck and osteotomy made with an oscillating saw.  The  femoral head is removed and the femur retracted anteriorly to gain  acetabular exposure.   The acetabular retractors are placed and the labrum and osteophytes removed.  Reaming starts at 45 mm, coursing in increments of 2 up to 53 mm, and then a  54 mm Pinnacle acetabular shell is placed in anatomic position and  transfixed with two dome screws.  The permanent apex hole eliminator is  placed and then the permanent 36 mm neutral Ultamet metal liner is placed.  This a metal-on-metal hip replacement.   The femur is prepared with the canal finder and irrigation.  Axial reaming  was performed up to 13.5 mm, proximal reaming to an 16F, and the sleeve  machined to a large.  An 16F large trial sleeve is placed with the 18 x 13  stem and a 36 +8 neck, matching her native anteversion.  A 36 +0 trial head  was placed.  The hip is then reduced with outstanding stability.  She had  full extension, full external rotation, 70 degrees flexion, 40 degrees  adduction, and 90 degrees of  internal rotation, and 90 degrees of flexion  and 90 degrees of internal rotation.  By placing the left leg on top of the  right, I felt as though the leg lengths were equal.  The hip was then  dislocated and all trials are removed.  The permanent 28F large sleeve is  placed with 18 x 13 stem and 36 +8 neck, matching native anteversion.  The  36 +0 head is placed and the hip is reduced with the same stability  parameters.  The wound was copiously irrigated with saline solution and the  short rotators reattached to the femur through drill holes.  Fascia lata was  closed over Hemovac drain with interrupted #1 Vicryl, subcu closed with #1-0  and #2-0 Vicryl and subcuticular running 4-0 Monocryl.  The incision is  cleaned and dried, and Steri-Strips and a bulky sterile dressing applied.  Drain is hooked to suction.  She is placed into a knee immobilizer,  awakened, and transported to recovery in stable condition.      Ollen Gross, M.D.  Electronically Signed     FA/MEDQ  D:   01/29/2006  T:  01/30/2006  Job:  [id number]

## 2010-11-16 NOTE — H&P (Signed)
NAME:  Melinda Thompson, SPAUGH                         ACCOUNT NO.:  [id number]   MEDICAL RECORD NO.:  [id number]                   PATIENT TYPE:  INP   LOCATION:  NA                                   FACILITY:  [facility]   PHYSICIAN:  Ollen Gross, M.D.                 DATE OF BIRTH:  Aug 20, 1950   DATE OF ADMISSION:  07/11/2003  DATE OF DISCHARGE:                                HISTORY & PHYSICAL   CHIEF COMPLAINT:  Right hip pain.   HISTORY OF PRESENT ILLNESS:  The patient is a 60 year old female who is seen  by Dr. Lequita Halt for right hip pain.  Her hip pain has been progressive to the  point where now it is hurting all the time.  She has a hard time completing  her duties at work.  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  She has  previously been evaluated and discussed hip replacement.  She is at a point  now where she would like to proceed with hip surgery.  Risks and benefits of  the procedure have been discussed with the patient, and she elects to  proceed with surgery.   ALLERGIES:  No known drug allergies.   CURRENT MEDICATIONS:  1. Zomig for occasional migraines.  2. Tylenol No.3 p.r.n. pain.  3. Diclofenac 75 mg b.i.d. p.r.n. or stopped prior to surgery.   PAST MEDICAL HISTORY:  1. History of migraines.  2. History of bronchitis.  3. Hiatal hernia.  4. History of urinary tract infections.  5. Arthritis.   PAST SURGICAL HISTORY:  Cataract surgery in July 2004.   SOCIAL HISTORY:  Married, currently separated.  Actor.  Nonsmoker, social intake of alcohol.  One story home with one step entering  into the front door.   FAMILY HISTORY:  Father with a history of coronary arterial disease with  stenting, hypertension, also diverticulitis.  Mother is a smoker and has  arthritis.   REVIEW OF SYSTEMS:  GENERAL:  No fevers, chills, night sweats.  NEUROLOGIC:  No seizures, syncope, paralysis.  RESPIRATORY:  No shortness of breath,  productive cough, hemoptysis.   RESPIRATORY:  No chest pain, angina,  orthopnea.  GASTROINTESTINAL:  No nausea, vomiting, diarrhea, constipation.  GENITOURINARY:  History of UTIs, no dysuria, hematuria, discharge at this  time.  MUSCULOSKELETAL:  Pertinent to the hip found in the history of  present illness.   PHYSICAL EXAMINATION:  VITAL SIGNS:  Pulse 64, respirations 12, blood  pressure 104/74.  GENERAL:  The patient is a 60 year old, well-developed, well-nourished, in  no acute distress.  She is alert, oriented, and cooperative.  HEENT:  Normocephalic, atraumatic.  Pupils round and reactive.  Oropharynx  clear.  EOMs are intact.  NECK:  Supple, no carotid bruits.  CHEST:  Clear to auscultation anterior and posterior chest walls, no  wheezes, rhonchi, or rales.  HEART:  Regular rate and  rhythm, no murmurs.  ABDOMEN:  Soft, nontender, bowel sounds are present.  RECTAL:  Not done, not pertinent to present illness.  BREASTS:  Not done, not pertinent to present illness.  GENITALIA:  Not done, not pertinent to present illness.  EXTREMITIES:  Significant to the right lower extremity.  She has hip flexion  of only about 75 degrees.  There is no internal or external rotation, only  20 degrees of abduction.  She is about 1/4 inch shorter on the right than  she is the left.   IMPRESSION:  1. Osteoarthritis, right hip.  2. History of bronchitis.  3. Hiatal hernia.  4. History of urinary tract infections.   PLAN:  The patient will be admitted to [facility] to undergo a  right total hip arthroplasty.  The surgery will be performed by Dr. Ollen Gross.     Melinda Thompson, P.A.              Ollen Gross, M.D.    ALP/MEDQ  D:  07/10/2003  T:  07/10/2003  Job:  [id number]

## 2010-11-16 NOTE — Discharge Summary (Signed)
NAME:  Melinda Thompson, Melinda Thompson                         ACCOUNT NO.:  [id number]   MEDICAL RECORD NO.:  [id number]                   PATIENT TYPE:  INP   LOCATION:  0468                                 FACILITY:  [facility]   PHYSICIAN:  Ollen Gross, M.D.                 DATE OF BIRTH:  March 05, 1951   DATE OF ADMISSION:  07/11/2003  DATE OF DISCHARGE:  07/15/2003                                 DISCHARGE SUMMARY   ADMISSION DIAGNOSES:  1. Right hip osteoarthritis.  2. History of migraines.  3. History of bronchitis.  4. Hiatal hernia.  5. Remote history of urinary tract infection.  6. Osteoarthritis.   DISCHARGE DIAGNOSES:  1. Right hip osteoarthritis.  2. History of migraines.  3. History of bronchitis.  4. Hiatal hernia.  5. Remote history of urinary tract infection.  6. Osteoarthritis.  7. Status post right total hip arthroplasty.   OPERATION:  Right total hip arthroplasty with surgeon, Dr. Ollen Gross,  assistant Alexzandrew Julien Girt, PA-C, under general anesthetic.   BRIEF HISTORY:  Ms. Melinda Thompson is a 60 year old female who has been  followed by Dr. Ollen Gross for some time for osteoarthritis involving her  right hip. It is now affecting her activities of daily living and causing  significant pain. X-rays have demonstrated bone-on-bone changes and risks  and benefits were discussed for hip replacement arthroplasty and she wished  to proceed.   HOSPITAL COURSE:  The patient was admitted, underwent the above-named  procedure, and tolerated this well.  All appropriate IV antibiotics and  analgesics were provided. Postoperatively she was placed on Coumadin for  three weeks as well as touchdown weightbearing and total hip precautions  were instituted. She remained hemodynamically stable postoperatively. IV  analgesics were weaned by outpatient day #2, and she was able to begin  working with therapy. She tolerated therapy well. She remained afebrile,  neurovascularly she was  intact, and by date, July 15, 2003, she had  reached all therapy goals and discharge goals. At this time she was stable  for discharge.   LABORATORY DATA:  Admission hemogram with hemoglobin of 13.7 and  postoperative 10.4, 10.1, and 9.7, respectively and stable.  Protimes and  INR monitored by pharmacy. On Coumadin for DVT and PE prophylaxis. Routine  chemistries on preoperative evaluation were normal. Postoperatively, mild  elevation of glucose of 172 and 120.  Urinalysis was negative. Blood type  was O positive.   EKG shows normal sinus rhythm. X-rays show a preoperative chest film with  mild chronic bronchitic changes. Right hip shows avascular necrosis,  dysplasia with significant osteoarthritic and degenerative changes of the  right hip.  Postoperatively, shows a right total hip prosthesis in good  alignment with no complicating features.   CONDITION ON DISCHARGE:  Stable and improved.   DISCHARGE MEDICATIONS AND PLAN:  The patient is being discharged home. Home  health physical therapy RNs are arranged.  She is touchdown weightbearing,  total hip precautions. She is provided with the following prescriptions:  Percocet, Robaxin, and Coumadin. Diet is as tolerated and she will resume  home medications on diet at this time. May shower five days postoperatively  and follow up in two weeks, to call for a time.     Tracy A. Shuford, P.A.-C.                 Ollen Gross, M.D.    TAS/MEDQ  D:  08/18/2003  T:  08/18/2003  Job:  [id number]

## 2012-08-29 ENCOUNTER — Emergency Department (HOSPITAL_COMMUNITY): Payer: Self-pay

## 2012-08-29 ENCOUNTER — Encounter (HOSPITAL_COMMUNITY): Payer: Self-pay | Admitting: Emergency Medicine

## 2012-08-29 ENCOUNTER — Emergency Department (HOSPITAL_COMMUNITY)
Admission: EM | Admit: 2012-08-29 | Discharge: 2012-08-29 | Disposition: A | Discharge status: Home / Self Care | Payer: Self-pay | Attending: Emergency Medicine | Admitting: Emergency Medicine

## 2012-08-29 DIAGNOSIS — Y9301 Activity, walking, marching and hiking: Secondary | ICD-10-CM | POA: Insufficient documentation

## 2012-08-29 DIAGNOSIS — Y929 Unspecified place or not applicable: Secondary | ICD-10-CM | POA: Insufficient documentation

## 2012-08-29 DIAGNOSIS — R110 Nausea: Secondary | ICD-10-CM | POA: Insufficient documentation

## 2012-08-29 DIAGNOSIS — Z79899 Other long term (current) drug therapy: Secondary | ICD-10-CM | POA: Insufficient documentation

## 2012-08-29 DIAGNOSIS — Z8739 Personal history of other diseases of the musculoskeletal system and connective tissue: Secondary | ICD-10-CM | POA: Insufficient documentation

## 2012-08-29 DIAGNOSIS — R42 Dizziness and giddiness: Secondary | ICD-10-CM | POA: Insufficient documentation

## 2012-08-29 DIAGNOSIS — S9000XA Contusion of unspecified ankle, initial encounter: Secondary | ICD-10-CM | POA: Insufficient documentation

## 2012-08-29 DIAGNOSIS — S93409A Sprain of unspecified ligament of unspecified ankle, initial encounter: Secondary | ICD-10-CM | POA: Insufficient documentation

## 2012-08-29 DIAGNOSIS — Z9889 Other specified postprocedural states: Secondary | ICD-10-CM | POA: Insufficient documentation

## 2012-08-29 DIAGNOSIS — X500XXA Overexertion from strenuous movement or load, initial encounter: Secondary | ICD-10-CM | POA: Insufficient documentation

## 2012-08-29 MED ORDER — HYDROCODONE-ACETAMINOPHEN 5-325 MG PO TABS: 2.0000 | ORAL_TABLET | ORAL | Status: DC | PRN

## 2012-08-29 MED ORDER — HYDROCODONE-ACETAMINOPHEN 5-325 MG PO TABS
2.0000 | ORAL_TABLET | Freq: Once | ORAL | Status: AC
  Administered 2012-08-29: 2 via ORAL
  Filled 2012-08-29: qty 2

## 2012-08-29 NOTE — ED Provider Notes (Signed)
History  This chart was scribed for non-physician practitioner working with Melinda Chick, MD, by Melinda Thompson, ED Scribe. This patient was seen in room WTR9/WTR9 and the patient's care was started at 9:54 PM   CSN: [id number]  Arrival date & time 08/29/12  2132   First MD Initiated Contact with Patient 08/29/12 2137      Chief Complaint  Patient presents with  . Ankle Injury     The history is provided by the patient. No language interpreter was used.   Melinda Thompson is a 62 y.o. female who presents to the Emergency Department complaining of rolling her right ankle inward while walking earlier today.  Pt reports she heard a pop when the ankle rolled.  She reports she felt nausea and lightheadedness immediately after, but reports that the sx have now improved.  She is experiencing swelling and bruising to the right ankle.  Nothing seems to make the sx better or worse.  Pt has h/o bilateral hip replacements, left knee problems.     Orthopaedist is Dr. Berton Lan History reviewed. No pertinent past medical history.  Past Surgical History  Procedure Laterality Date  . Total hip arthroplasty      No family history on file.  History  Substance Use Topics  . Smoking status: Not on file  . Smokeless tobacco: Not on file  . Alcohol Use: Not on file    OB History   Grav Para Term Preterm Abortions TAB SAB Ect Mult Living                  Review of Systems  Musculoskeletal: Positive for arthralgias (right ankle pain).  All other systems reviewed and are negative.    Allergies  Sulfonamide derivatives  Home Medications   Current Outpatient Rx  Name  Route  Sig  Dispense  Refill  . sertraline (ZOLOFT) 25 MG tablet   Oral   Take 25 mg by mouth daily.         Marland Kitchen HYDROcodone-acetaminophen (NORCO/VICODIN) 5-325 MG per tablet   Oral   Take 2 tablets by mouth every 4 (four) hours as needed for pain.   10 tablet   0     BP 140/99  Pulse 75  Temp(Src) 97.8 F  (36.6 C) (Oral)  Resp 16  SpO2 99%  Physical Exam  Nursing note and vitals reviewed. Constitutional: She is oriented to person, place, and time. She appears well-developed and well-nourished. No distress.  HENT:  Head: Normocephalic and atraumatic.  Eyes: Conjunctivae are normal. Pupils are equal, round, and reactive to light.  Neck: Normal range of motion and full passive range of motion without pain. No spinous process tenderness and no muscular tenderness present. Normal range of motion present.  Cardiovascular: Normal rate, regular rhythm, normal heart sounds and intact distal pulses.  Exam reveals no gallop.   No murmur heard. Capillary refill less than 3 seconds  Pulmonary/Chest: Effort normal and breath sounds normal. No respiratory distress. She has no wheezes. She has no rales.  Abdominal: Soft. She exhibits no distension. There is no tenderness.  Musculoskeletal: She exhibits edema and tenderness.  Cap refill less than 3 seconds.  Good strength.  Decreased ROM in the right ankle secondary to pain.  Tenderness in lateral malleolus.  Achilles intact.  Swelling and ecchymosis to lateral side of ankle.      Lymphadenopathy:    She has no cervical adenopathy.  Neurological: She is alert and oriented to person, place,  and time. She exhibits normal muscle tone. Coordination normal.  Sensation intact to dull and sharp Strength 5 out of 5 in the right toes, right ankle and right knee  Skin: Skin is warm and dry. She is not diaphoretic. No erythema.    ED Course  Procedures  DIAGNOSTIC STUDIES: Oxygen Saturation is 99% on room air, normal by my interpretation.    COORDINATION OF CARE: 10:02 PM Discussed with pt course of care which includes splinting ankle.  Pt denies pain medication.  Pt understands and agrees.     Labs Reviewed - No data to display Dg Ankle Complete Right  08/29/2012  *RADIOLOGY REPORT*  Clinical Data: 62 year old female generalized right ankle pain status  post twisting injury.  Pain and swelling and unable to weightbear.  RIGHT ANKLE - COMPLETE 3+ VIEW  Comparison: None.  Findings: Calcaneus intact.  No definite joint effusion.  Mortise joint alignment within normal limits.  Talar dome intact.  Soft tissue swelling about the ankle.  No acute fracture or dislocation identified.  IMPRESSION: Soft tissue swelling without acute fracture or dislocation identified about the right ankle.   Original Report Authenticated By: Erskine Speed, M.D.      1. Right ankle sprain, initial encounter       MDM  Melinda Thompson presents after rolling her ankle.  X-ray with soft tissue swelling without acute fracture or dislocation.  Patient refusing pain medication at this time. We'll place in ASO.  Pt unable to ambulate 2/2 pain therefore we will also give crutches.    Pt now requesting pain medication; pain controlled in the department. Recommend f/u with [facility] for further evaluation if no improvement in 1 week.  I have also discussed reasons to return immediately to the ER.  Patient expresses understanding and agrees with plan.   1. Medications: vicodin, usual home medications 2. Treatment: rest, drink plenty of fluids, RICE 3. Follow Up: Please followup with your primary doctor for discussion of your diagnoses and further evaluation after today's visit; if you do not have a primary care doctor use the resource guide provided to find one; f/u with Dr Berton Lan for re-evaluation.    I personally performed the services described in this documentation, which was scribed in my presence. The recorded information has been reviewed and is accurate.    Melinda Client Naveena Eyman, PA-C 08/29/12 2232

## 2012-08-29 NOTE — ED Notes (Signed)
Pt back from XR 

## 2012-08-29 NOTE — ED Notes (Signed)
Pt c/o right ankle pain since 1 hour prior. Pt states foot "just rolled" and stated she felt and heard a pop.

## 2012-08-29 NOTE — ED Provider Notes (Signed)
Medical screening examination/treatment/procedure(s) were performed by non-physician practitioner and as supervising physician I was immediately available for consultation/collaboration.  Ethelda Chick, MD 08/29/12 2237

## 2012-08-29 NOTE — ED Notes (Signed)
Ortho tech at bedside 

## 2012-08-29 NOTE — ED Notes (Signed)
Pt transported to XR.  

## 2012-11-19 ENCOUNTER — Emergency Department (HOSPITAL_COMMUNITY): Payer: BC Managed Care – PPO

## 2012-11-19 ENCOUNTER — Encounter (HOSPITAL_COMMUNITY): Payer: Self-pay

## 2012-11-19 ENCOUNTER — Emergency Department (HOSPITAL_COMMUNITY)
Admission: EM | Admit: 2012-11-19 | Discharge: 2012-11-19 | Disposition: A | Discharge status: Home / Self Care | Payer: BC Managed Care – PPO | Attending: Emergency Medicine | Admitting: Emergency Medicine

## 2012-11-19 DIAGNOSIS — Y92009 Unspecified place in unspecified non-institutional (private) residence as the place of occurrence of the external cause: Secondary | ICD-10-CM | POA: Insufficient documentation

## 2012-11-19 DIAGNOSIS — Z79899 Other long term (current) drug therapy: Secondary | ICD-10-CM | POA: Insufficient documentation

## 2012-11-19 DIAGNOSIS — S73006A Unspecified dislocation of unspecified hip, initial encounter: Secondary | ICD-10-CM | POA: Insufficient documentation

## 2012-11-19 DIAGNOSIS — Y9389 Activity, other specified: Secondary | ICD-10-CM | POA: Insufficient documentation

## 2012-11-19 DIAGNOSIS — S73004A Unspecified dislocation of right hip, initial encounter: Secondary | ICD-10-CM

## 2012-11-19 DIAGNOSIS — Z96698 Presence of other orthopedic joint implants: Secondary | ICD-10-CM | POA: Insufficient documentation

## 2012-11-19 DIAGNOSIS — R296 Repeated falls: Secondary | ICD-10-CM | POA: Insufficient documentation

## 2012-11-19 MED ORDER — MIDAZOLAM HCL 2 MG/2ML IJ SOLN
2.0000 mg | Freq: Once | INTRAMUSCULAR | Status: AC
  Filled 2012-11-19: qty 4

## 2012-11-19 MED ORDER — PROPOFOL 10 MG/ML IV BOLUS
1.0000 mg/kg | Freq: Once | INTRAVENOUS | Status: AC
  Administered 2012-11-19: 60 mg via INTRAVENOUS
  Filled 2012-11-19: qty 1

## 2012-11-19 MED ORDER — HYDROMORPHONE HCL PF 1 MG/ML IJ SOLN
1.0000 mg | INTRAMUSCULAR | Status: DC | PRN
  Administered 2012-11-19 (×3): 1 mg via INTRAVENOUS
  Filled 2012-11-19 (×3): qty 1

## 2012-11-19 MED ORDER — FENTANYL CITRATE 0.05 MG/ML IJ SOLN
100.0000 ug | Freq: Once | INTRAMUSCULAR | Status: AC
  Administered 2012-11-19: 100 ug via INTRAVENOUS
  Filled 2012-11-19: qty 2

## 2012-11-19 MED ORDER — HYDROCODONE-ACETAMINOPHEN 5-325 MG PO TABS: 1.0000 | ORAL_TABLET | Freq: Four times a day (QID) | ORAL | Status: DC | PRN

## 2012-11-19 MED ORDER — PROPOFOL 10 MG/ML IV BOLUS
INTRAVENOUS | Status: AC | PRN
  Administered 2012-11-19: 50 mg via INTRAVENOUS
  Administered 2012-11-19: 90 mg via INTRAVENOUS
  Administered 2012-11-19: 30 mg via INTRAVENOUS

## 2012-11-19 MED ORDER — PROPOFOL 10 MG/ML IV BOLUS: INTRAVENOUS | Status: DC | PRN

## 2012-11-19 MED ORDER — PROPOFOL 10 MG/ML IV BOLUS
1.0000 mg/kg | Freq: Once | INTRAVENOUS | Status: AC
  Filled 2012-11-19: qty 1

## 2012-11-19 MED ORDER — MIDAZOLAM HCL 2 MG/2ML IJ SOLN
INTRAMUSCULAR | Status: DC | PRN
  Administered 2012-11-19: 2 mg via INTRAVENOUS

## 2012-11-19 NOTE — ED Notes (Signed)
2nd Sedation End.

## 2012-11-19 NOTE — Consult Note (Addendum)
Reason for Consult:  Right hip dislocation Referring Physician: Dr. Jodelle Gross is an 62 y.o. female.  HPI: 62 y/o female who is s/p bilat THAs by Dr. Despina Hick sustained a dislocation of her right hip when she was putting lotion on her legs after showering this morning.  No h/o previous dislocation.  She c/o aching pain in the hip that is worse with motion and better with holding still.  She denies numbness, tingling or weakness in her LEs.  She is not diabetic and doesn't smoke.  Closed reduction was attempted by Dr. Jeraldine Loots under sedation but was unfortunately unsuccessful.  I'm asked to evaluate and treat the patient.  PMH:  none  Past Surgical History  Procedure Laterality Date  . Total hip arthroplasty    . Joint replacement      FH:  IBS in her father  Social History:  reports that she has never smoked. She has never used smokeless tobacco. She reports that  drinks alcohol. She reports that she does not use illicit drugs.  Works at [employer] in [place].  Allergies:  Allergies  Allergen Reactions  . Sulfonamide Derivatives Itching and Other (See Comments)    Unknown reaction    Medications: I have reviewed the patient's current medications.  No results found for this or any previous visit (from the past 48 hour(s)).  Dg Chest 1 View  11/19/2012   *RADIOLOGY REPORT*  Clinical Data: Hip injury  CHEST - 1 VIEW  Comparison: 01/27/2006  Findings: Cardiomediastinal silhouette is stable.  No acute infiltrate or pleural effusion.  No pulmonary edema.  Bony thorax is unremarkable.  IMPRESSION: No active disease.  No significant change.   Original Report Authenticated By: Natasha Mead, M.D.   Dg Hip Complete Right  11/19/2012   *RADIOLOGY REPORT*  Clinical Data: Fall  RIGHT HIP - COMPLETE 2+ VIEW  Comparison: None  Findings: Three views of the right hip submitted.  There is a superior dislocation of femoral component right hip prosthesis. Left hip prosthesis anatomic  alignment.  IMPRESSION: Superior dislocation of femoral component right hip prosthesis.   Original Report Authenticated By: Natasha Mead, M.D.    ROS:  No recent f/c/n/v/wt loss  PE:  Blood pressure 126/88, pulse 101, temperature 98.5 F (36.9 C), temperature source Oral, resp. rate 18, weight 88.451 kg (195 lb), SpO2 100.00%. wn wd woman in nad.  A and O.  Mood and affect normal.  EOMI.  Resp unlabored.  R LE shortened and internally rotated.  1+ dp and pt pulses.  Intact sens to LT at dorsal and plantar foot.  5/5 strength in PF and DF of the ankle and toes.  Skin is healthy and intact at the R LE.  No lymphadenopathy noted.  Assessment/Plan: R hip periprosthetic dislocation - I explained the nature of the injury to the patient and her family in detail.  She requires reduction of her hip, and I propose another attempt at closed reduction under sedation in the ER.  She understands the risks and benefits of the alternative treatment options and would like to proceed.    PROCEDURE:  After informed consent and administration of IV sedation by Dr. Hyacinth Meeker, I reduced the hip with flexion, internal rotation and traction.  After reduction the  R LE was equal in length to the left and symmetric with regard to external rotation.  She tolerated the procedure well, and there were no evident complications.  A knee immobilizer was applied to the  R LE.  Post reduction xrays show that the hip is reduced.  I'll have her follow up with Dr. Despina Hick in clinic in a couple of weeks.  Toni Arthurs 11/19/2012, 6:09 PM

## 2012-11-19 NOTE — ED Notes (Signed)
2nd Sedation started.

## 2012-11-19 NOTE — ED Notes (Signed)
Melinda Thompson (son) [phone]

## 2012-11-19 NOTE — ED Provider Notes (Signed)
History     CSN: [id number]  Arrival date & time 11/19/12  0941   First MD Initiated Contact with Patient 11/19/12 1005      Chief Complaint  Patient presents with  . Hip Injury    (Consider location/radiation/quality/duration/timing/severity/associated sxs/prior treatment) HPI Patient presents after the acute onset of right hip pain and just prior to ED evaluation. She was bending over, felt a uncomfortable sensation in her right hip.  Since that time she's had severe pain in the hip, worse with any attempted motion. Pain is minimally relieved with fentanyl, which she received via EMS in route. No distal dysesthesia, but there is distal pain. No incontinence, fever, chills, vomiting or other notable complaints. No subsequent fall / trauma, other injuries.  She has a history of prior hip replacement, in 2005. Patient's orthopedist is Aluisio.   History reviewed. No pertinent past medical history.  Past Surgical History  Procedure Laterality Date  . Total hip arthroplasty      No family history on file.  History  Substance Use Topics  . Smoking status: Never Smoker   . Smokeless tobacco: Never Used  . Alcohol Use: Yes     Comment: rarely    OB History   Grav Para Term Preterm Abortions TAB SAB Ect Mult Living                  Review of Systems  Constitutional:       Per HPI, otherwise negative  HENT:       Per HPI, otherwise negative  Respiratory:       Per HPI, otherwise negative  Cardiovascular:       Per HPI, otherwise negative  Gastrointestinal: Negative for vomiting.  Endocrine:       Negative aside from HPI  Genitourinary:       Neg aside from HPI   Musculoskeletal:       Per HPI, otherwise negative  Skin: Negative.   Neurological: Negative for syncope.    Allergies  Sulfonamide derivatives  Home Medications   Current Outpatient Rx  Name  Route  Sig  Dispense  Refill  . multivitamin-iron-minerals-folic acid (CENTRUM) chewable tablet    Oral   Chew 1 tablet by mouth every morning.         Marland Kitchen omeprazole (PRILOSEC) 20 MG capsule   Oral   Take 20 mg by mouth every morning.         . sertraline (ZOLOFT) 25 MG tablet   Oral   Take 25 mg by mouth at bedtime.          . SUMAtriptan (IMITREX) 50 MG tablet   Oral   Take 50 mg by mouth every 2 (two) hours as needed for migraine.           BP 147/82  Pulse 71  Temp(Src) 98.5 F (36.9 C) (Oral)  Resp 18  SpO2 95%  Physical Exam  Nursing note and vitals reviewed. Constitutional: She is oriented to person, place, and time. She appears well-developed and well-nourished. She appears distressed.  HENT:  Head: Normocephalic and atraumatic.  Eyes: Conjunctivae and EOM are normal.  Neck: No tracheal deviation present.  Cardiovascular: Normal rate and regular rhythm.   Pulmonary/Chest: Effort normal and breath sounds normal. No stridor. No respiratory distress.  Musculoskeletal:  Patient will not move the R hip or knee 2/2 pain.  She will move the ankle, which is grossly unremarkable. Pulses are distally appropriate. Sensation is appropriate  Neurological:  She is alert and oriented to person, place, and time. No cranial nerve deficit.  Skin: Skin is warm and dry.  Psychiatric: She has a normal mood and affect.    ED Course  Procedures (including critical care time)  Labs Reviewed - No data to display No results found.   No diagnosis found.  O2- 99%ra, normal  Cardiac: 85sr, normal  Procedural sedation Performed by: Gerhard Munch Consent: Verbal consent obtained. Risks and benefits: risks, benefits and alternatives were discussed Required items: required blood products, implants, devices, and special equipment available Patient identity confirmed: arm band and provided demographic data Time out: Immediately prior to procedure a "time out" was called to verify the correct patient, procedure, equipment, support staff and site/side marked as  required.  Sedation type: moderate (conscious) sedation NPO time confirmed and considedered  Sedatives: PROPOFOL  Physician Time at Bedside: 30  Vitals: Vital signs were monitored during sedation. Cardiac Monitor, pulse oximeter Patient tolerance: Patient tolerated the procedure well with no immediate complications. Comments: Pt with uneventful recovered. Returned to pre-procedural sedation baseline  Following sedation with propofol multiple attempts were made to reduce the dislocated hip. Attempts well tolerated, though unsuccessful.  I spoke with our orthopedist on call.  3:32 PM Patient sleeping. I again spoke with our orthopedist on call, he was in the operating room.  The patient will be seen by our orthopedic colleagues as soon as possible.  MDM  This patient with a prosthetic hip presents after slipping and. On exam she is awake and alert, uncomfortable appearing.  She is distally neurovascularly intact.  Following an unsuccessful attempt at reduction in the emergency department after appropriate conscious sedation, the patient required further evaluation and management by our orthopedist on call, Dr. Victorino Dike. Patient remained stable, pending his evaluation.        Gerhard Munch, MD 11/19/12 1534

## 2012-11-19 NOTE — ED Notes (Signed)
[id number]<[id number]> Expected date:<BR> Expected time:<BR> Means of arrival:<BR> Comments:<BR> ems- fall

## 2012-11-19 NOTE — ED Notes (Signed)
Patient transported to X-ray 

## 2012-11-19 NOTE — ED Notes (Signed)
Patient is alert and oriented x3.  He was given DC instructions and follow up visit instructions.  Patient gave verbal understanding.  He was DC ambulatory under his own power to home.  V/S stable.  He was not showing any signs of distress on DC 

## 2012-11-19 NOTE — ED Notes (Addendum)
Per EMS, Pt, from home, was bending over to put lotion on her feet and felt R hip "pop out of place and heard several pops."  Hx bilateral hip replacement.  Pain score 10/10.  Leg shortening noted.  Fentanyl and 4mg  Zofran given in route.

## 2012-11-19 NOTE — ED Provider Notes (Signed)
62 year old female, hip dislocation on the right, unsuccessful reduction attempt in emergency department prior to orthopedic attempt. Dr. Victorino Dike has evaluated the patient and successfully relocated the hip. I was present in the room throughout this procedure performing procedural sedation.  Procedural sedation Performed by: Vida Roller Consent: Verbal consent obtained. Risks and benefits: risks, benefits and alternatives were discussed Required items: required blood products, implants, devices, and special equipment available Patient identity confirmed: arm band and provided demographic data Time out: Immediately prior to procedure a "time out" was called to verify the correct patient, procedure, equipment, support staff and site/side marked as required.  Sedation type: moderate (conscious) sedation NPO time confirmed and considedered  Sedatives: PROPOFOL, Versed   Physician Time at Bedside: 20 minutes  Vitals: Vital signs were monitored during sedation. Cardiac Monitor, pulse oximeter Patient tolerance: Patient tolerated the procedure well with no immediate complications. Comments: Pt with uneventful recovered. Returned to pre-procedural sedation baseline     Vida Roller, MD 11/19/12 1946

## 2012-11-19 NOTE — ED Notes (Signed)
MD at bedside. 

## 2014-11-23 ENCOUNTER — Other Ambulatory Visit: Payer: Self-pay | Admitting: Obstetrics & Gynecology

## 2014-11-23 ENCOUNTER — Other Ambulatory Visit (HOSPITAL_COMMUNITY)
Admission: RE | Admit: 2014-11-23 | Discharge: 2014-11-23 | Disposition: A | Discharge status: Home / Self Care | Payer: 59 | Source: Ambulatory Visit | Attending: Obstetrics & Gynecology | Admitting: Obstetrics & Gynecology

## 2014-11-23 DIAGNOSIS — Z01419 Encounter for gynecological examination (general) (routine) without abnormal findings: Secondary | ICD-10-CM | POA: Diagnosis present

## 2014-11-23 DIAGNOSIS — Z1151 Encounter for screening for human papillomavirus (HPV): Secondary | ICD-10-CM | POA: Insufficient documentation

## 2014-11-25 LAB — CYTOLOGY - PAP

## 2014-12-01 ENCOUNTER — Other Ambulatory Visit: Payer: Self-pay | Admitting: Cardiology

## 2014-12-01 ENCOUNTER — Ambulatory Visit
Admission: RE | Admit: 2014-12-01 | Discharge: 2014-12-01 | Disposition: A | Discharge status: Home / Self Care | Payer: 59 | Source: Ambulatory Visit | Attending: Cardiology | Admitting: Cardiology

## 2014-12-01 DIAGNOSIS — R0602 Shortness of breath: Secondary | ICD-10-CM

## 2015-09-26 ENCOUNTER — Encounter: Payer: Self-pay | Admitting: Gastroenterology

## 2017-04-26 IMAGING — CR DG CHEST 2V
2 series · 2 of 2 positions shown · non-contrast
Comparison: Portable chest x-ray November 19, 2012

CLINICAL DATA: Six-month history of progressive shortness of
breath, nonsmoker

EXAM:
CHEST  2 VIEW

[w chest pa]
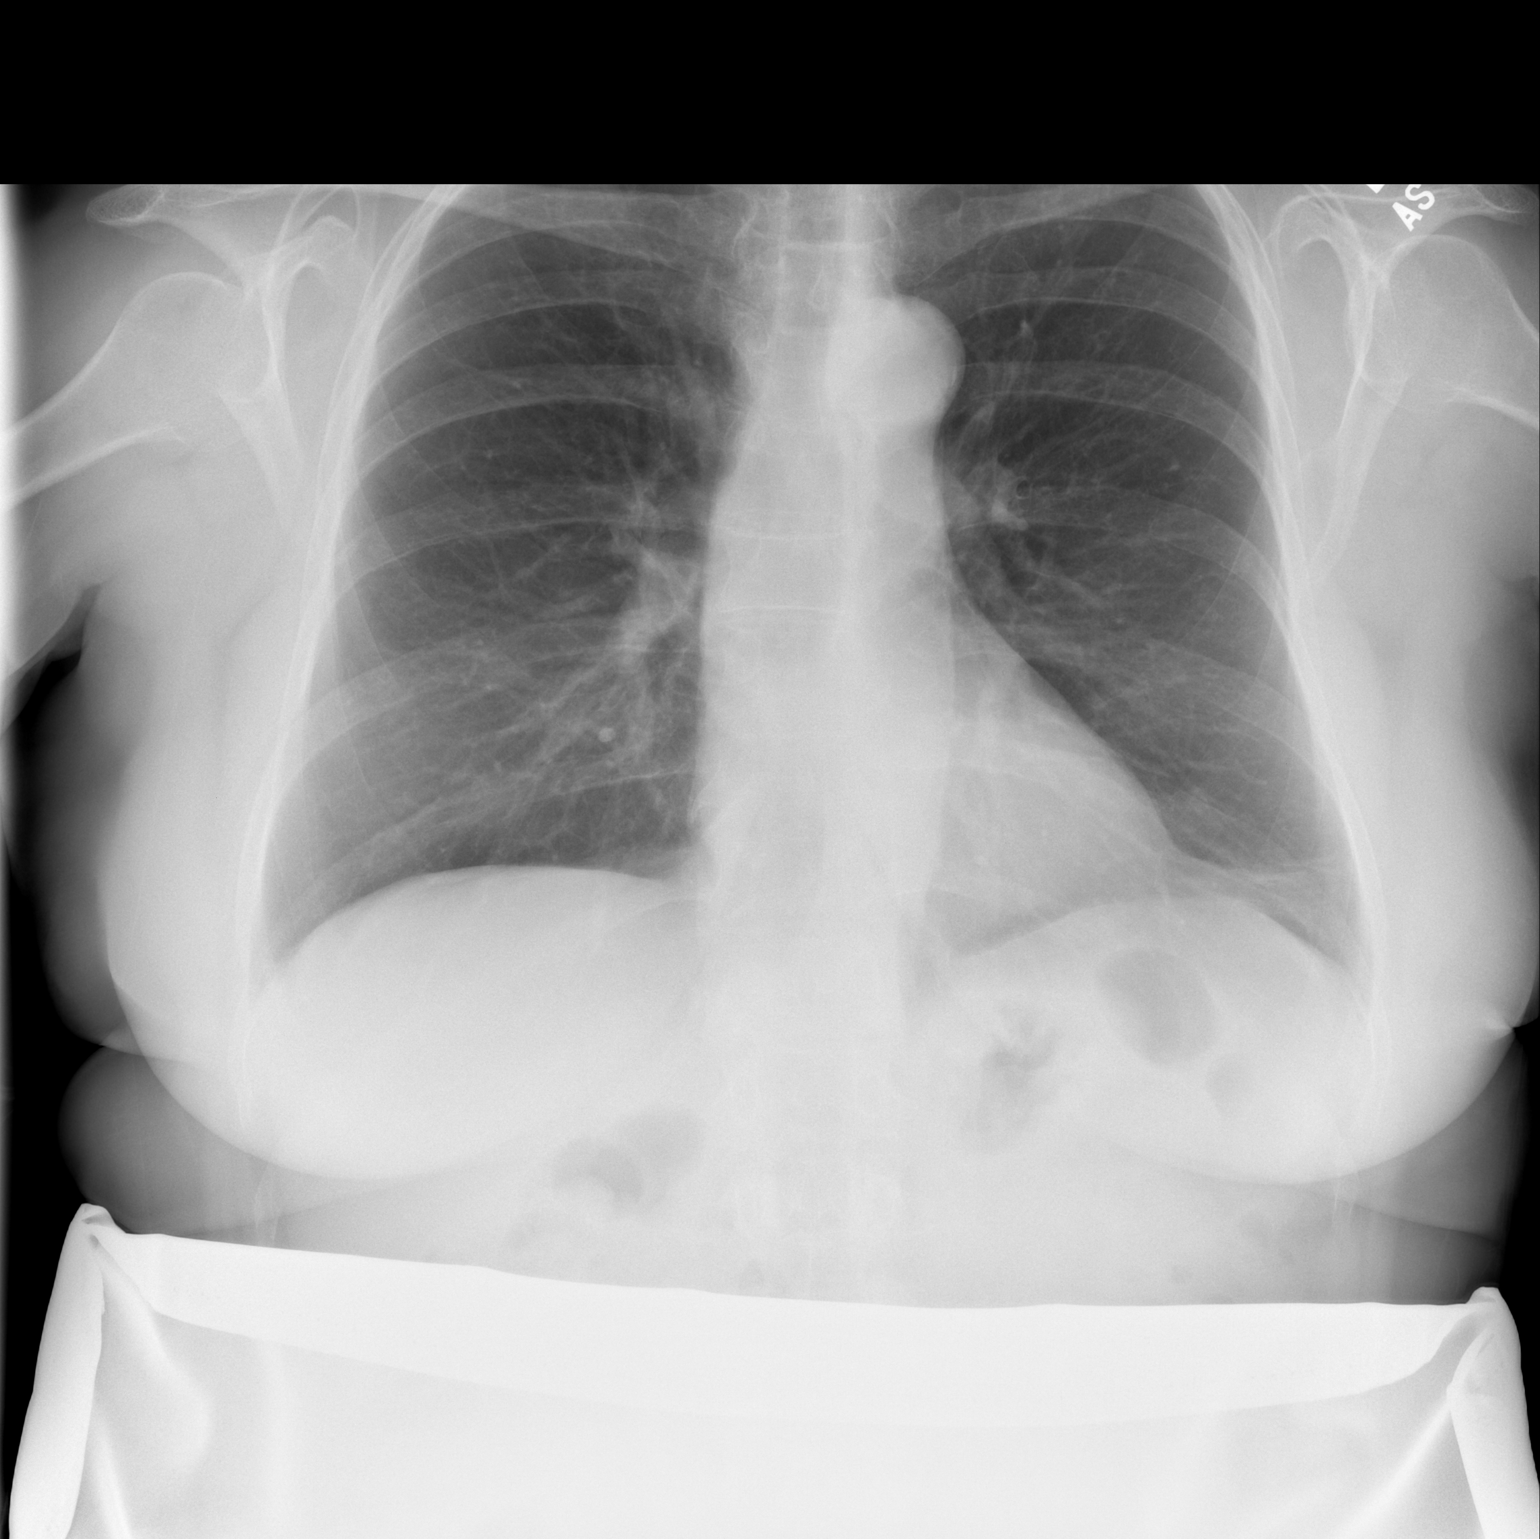

[w chest lat]
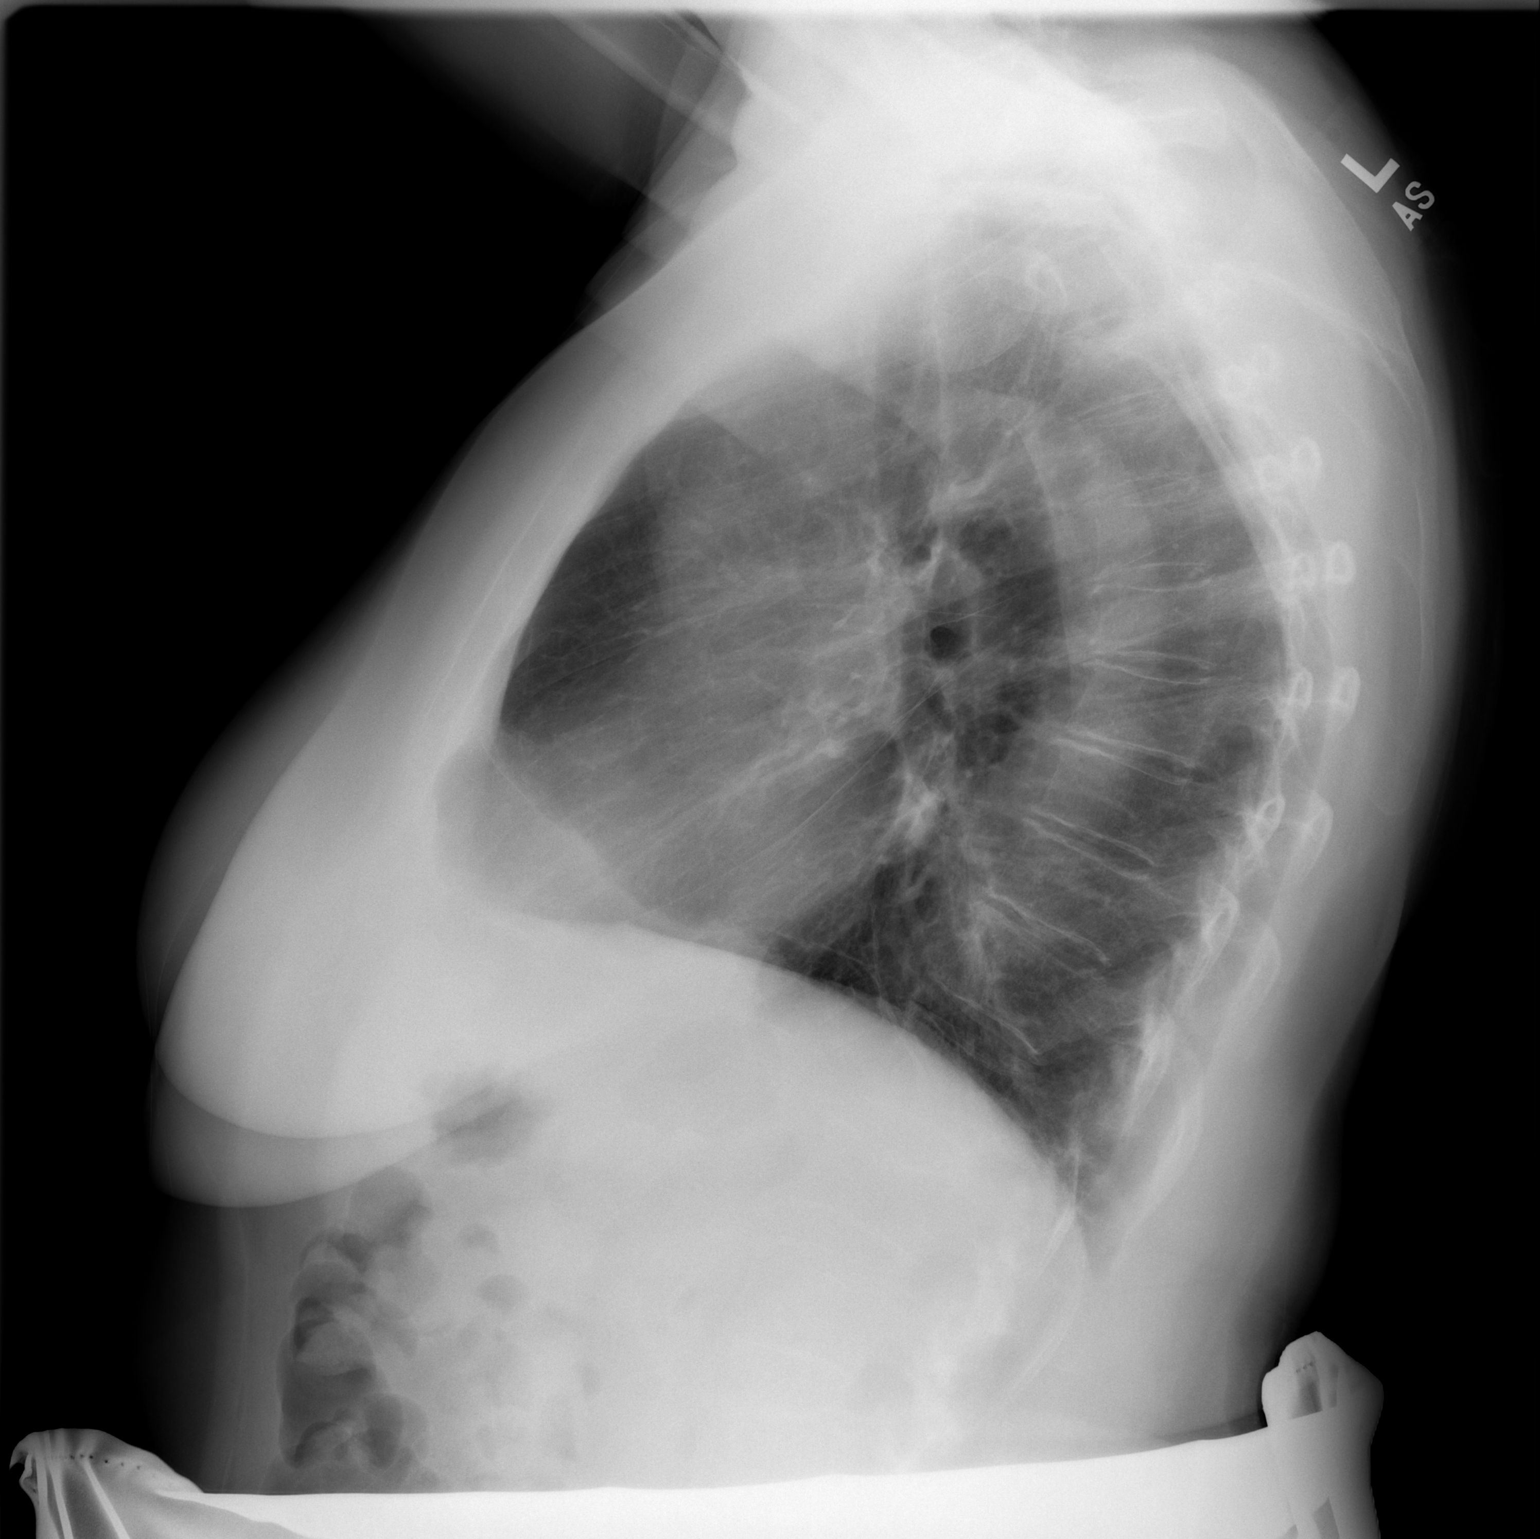

[2 of 2 positions shown; findings below may reference images not displayed]

FINDINGS: The lungs are adequately inflated. There is linear density lateral
to the left cardiac apex new since 5665 and possibly new since Monday October, 2012. The heart and pulmonary vascularity are normal. The
mediastinum is normal in width. There is no pleural effusion or
pneumothorax. The bony thorax exhibits no acute abnormality.
IMPRESSION: Increased density in the lateral aspect of the lingula likely
reflecting atelectasis. Given the persistent progressive symptoms,
chest CT scanning is recommended.

## 2019-03-26 DIAGNOSIS — M545 Low back pain: Secondary | ICD-10-CM | POA: Diagnosis not present

## 2019-03-26 DIAGNOSIS — M4310 Spondylolisthesis, site unspecified: Secondary | ICD-10-CM | POA: Diagnosis not present

## 2019-05-24 DIAGNOSIS — M545 Low back pain: Secondary | ICD-10-CM | POA: Diagnosis not present

## 2019-05-24 DIAGNOSIS — M4310 Spondylolisthesis, site unspecified: Secondary | ICD-10-CM | POA: Diagnosis not present

## 2019-06-04 DIAGNOSIS — M4310 Spondylolisthesis, site unspecified: Secondary | ICD-10-CM | POA: Diagnosis not present

## 2019-06-11 DIAGNOSIS — M545 Low back pain: Secondary | ICD-10-CM | POA: Diagnosis not present

## 2019-07-29 DIAGNOSIS — M25551 Pain in right hip: Secondary | ICD-10-CM | POA: Diagnosis not present

## 2019-08-09 DIAGNOSIS — M25551 Pain in right hip: Secondary | ICD-10-CM | POA: Diagnosis not present

## 2019-08-12 DIAGNOSIS — K219 Gastro-esophageal reflux disease without esophagitis: Secondary | ICD-10-CM | POA: Diagnosis not present

## 2019-08-12 DIAGNOSIS — R69 Illness, unspecified: Secondary | ICD-10-CM | POA: Diagnosis not present

## 2019-08-12 DIAGNOSIS — E559 Vitamin D deficiency, unspecified: Secondary | ICD-10-CM | POA: Diagnosis not present

## 2019-08-12 DIAGNOSIS — G43909 Migraine, unspecified, not intractable, without status migrainosus: Secondary | ICD-10-CM | POA: Diagnosis not present

## 2019-08-12 DIAGNOSIS — M8588 Other specified disorders of bone density and structure, other site: Secondary | ICD-10-CM | POA: Diagnosis not present

## 2019-08-12 DIAGNOSIS — E7800 Pure hypercholesterolemia, unspecified: Secondary | ICD-10-CM | POA: Diagnosis not present

## 2019-08-12 DIAGNOSIS — Z1211 Encounter for screening for malignant neoplasm of colon: Secondary | ICD-10-CM | POA: Diagnosis not present

## 2019-08-12 DIAGNOSIS — Z0000 Encounter for general adult medical examination without abnormal findings: Secondary | ICD-10-CM | POA: Diagnosis not present

## 2019-08-17 ENCOUNTER — Other Ambulatory Visit: Payer: Self-pay | Admitting: Family Medicine

## 2019-08-17 DIAGNOSIS — Z1231 Encounter for screening mammogram for malignant neoplasm of breast: Secondary | ICD-10-CM

## 2019-08-17 DIAGNOSIS — M8580 Other specified disorders of bone density and structure, unspecified site: Secondary | ICD-10-CM

## 2019-08-23 DIAGNOSIS — M25551 Pain in right hip: Secondary | ICD-10-CM | POA: Diagnosis not present

## 2019-09-16 DIAGNOSIS — Z96641 Presence of right artificial hip joint: Secondary | ICD-10-CM | POA: Diagnosis not present

## 2019-09-16 DIAGNOSIS — M25551 Pain in right hip: Secondary | ICD-10-CM | POA: Diagnosis not present

## 2019-09-21 ENCOUNTER — Other Ambulatory Visit: Payer: Self-pay

## 2019-09-21 ENCOUNTER — Emergency Department
Admission: EM | Admit: 2019-09-21 | Discharge: 2019-09-21 | Disposition: A | Discharge status: Home / Self Care | Payer: Medicare HMO | Source: Home / Self Care | Attending: Family Medicine | Admitting: Family Medicine

## 2019-09-21 ENCOUNTER — Encounter: Payer: Self-pay | Admitting: Emergency Medicine

## 2019-09-21 DIAGNOSIS — H5213 Myopia, bilateral: Secondary | ICD-10-CM | POA: Diagnosis not present

## 2019-09-21 DIAGNOSIS — L304 Erythema intertrigo: Secondary | ICD-10-CM

## 2019-09-21 MED ORDER — KETOCONAZOLE 2 % EX CREA: TOPICAL_CREAM | CUTANEOUS | 0 refills | Status: DC

## 2019-09-21 MED ORDER — PREDNISONE 20 MG PO TABS: ORAL_TABLET | ORAL | 0 refills | Status: DC

## 2019-09-21 MED ORDER — DOXYCYCLINE HYCLATE 100 MG PO CAPS: 100.0000 mg | ORAL_CAPSULE | Freq: Two times a day (BID) | ORAL | 0 refills | Status: DC

## 2019-09-21 NOTE — ED Triage Notes (Signed)
Rash in bend of legs x 3 weeks

## 2019-09-21 NOTE — ED Provider Notes (Signed)
[facility]    CSN: [id number] Arrival date & time: 09/21/19  1404      History   Chief Complaint Chief Complaint  Patient presents with  . Rash    HPI Melinda Thompson is a 69 y.o. female.   For about three weeks patient has had a persistent irritating rash in her bilateral inguinal area and intergluteal cleft that has not responded to a variety of creams, including a barrier cream.  She reports that the rash occurred after an episode of diarrhea. She denies history of diabetes.  The history is provided by the patient.  Rash Location: inguinal area. Quality: painful, redness and weeping   Pain details:    Quality:  Burning and itching   Severity:  Mild   Onset quality:  Gradual   Duration:  3 weeks   Timing:  Constant   Progression:  Worsening Severity:  Moderate Onset quality:  Gradual Duration:  3 weeks Timing:  Constant Progression:  Worsening Chronicity:  New Context: not chemical exposure, not food, not hot tub use and not new detergent/soap   Relieved by:  Nothing Worsened by:  Heat and moisture Ineffective treatments:  Anti-itch cream Associated symptoms: no abdominal pain, no diarrhea, no fatigue, no fever and no induration     History reviewed. No pertinent past medical history.  Patient Active Problem List   Diagnosis Date Noted  . IRRITABLE BOWEL SYNDROME 03/16/2010  . HIATAL HERNIA 02/12/2010  . DEPRESSION 01/04/2010  . GERD 01/04/2010  . CONSTIPATION 01/04/2010  . CHEST PAIN 01/04/2010  . PERSONAL HISTORY OF ARTHRITIS 01/04/2010    Past Surgical History:  Procedure Laterality Date  . JOINT REPLACEMENT    . TOTAL HIP ARTHROPLASTY      OB History   No obstetric history on file.      Home Medications    Prior to Admission medications   Medication Sig Start Date End Date Taking? Authorizing Provider  doxycycline (VIBRAMYCIN) 100 MG capsule Take 1 capsule (100 mg total) by mouth 2 (two) times daily. Take with food. 09/21/19    Kandra Nicolas, MD  ketoconazole (NIZORAL) 2 % cream Apply once daily for two weeks. 09/21/19   Kandra Nicolas, MD  multivitamin-iron-minerals-folic acid (CENTRUM) chewable tablet Chew 1 tablet by mouth every morning.    [provider]  omeprazole (PRILOSEC) 20 MG capsule Take 20 mg by mouth every morning.    [provider]  predniSONE (DELTASONE) 20 MG tablet Take one tab by mouth twice daily for 4 days, then one daily for 3 days. Take with food. 09/21/19   Kandra Nicolas, MD  sertraline (ZOLOFT) 25 MG tablet Take 25 mg by mouth at bedtime.     [provider]  SUMAtriptan (IMITREX) 50 MG tablet Take 50 mg by mouth every 2 (two) hours as needed for migraine.    [provider]    Family History Family History  Problem Relation Age of Onset  . COPD Mother   . Osteoporosis Mother   . Emphysema Mother   . Hypertension Father   . COPD Father     Social History Social History   Tobacco Use  . Smoking status: Never Smoker  . Smokeless tobacco: Never Used  Substance Use Topics  . Alcohol use: Yes    Comment: rarely  . Drug use: No     Allergies   Sulfonamide derivatives   Review of Systems Review of Systems  Constitutional: Negative for chills,  diaphoresis, fatigue and fever.  Gastrointestinal: Negative for abdominal pain and diarrhea.  Skin: Positive for color change and rash.  All other systems reviewed and are negative.    Physical Exam Triage Vital Signs ED Triage Vitals  Enc Vitals Group     BP 09/21/19 1428 (!) 149/91     Pulse Rate 09/21/19 1428 74     Resp --      Temp 09/21/19 1428 98 F (36.7 C)     Temp Source 09/21/19 1428 Oral     SpO2 09/21/19 1428 96 %     Weight 09/21/19 1430 198 lb (89.8 kg)     Height 09/21/19 1430 5\' 4"  (1.626 m)     Head Circumference --      Peak Flow --      Pain Score 09/21/19 1429 1     Pain Loc --      Pain Edu? --      Excl. in [place]? --    No data found.  Updated Vital  Signs BP (!) 149/91 (BP Location: Right Arm)   Pulse 74   Temp 98 F (36.7 C) (Oral)   Ht 5\' 4"  (1.626 m)   Wt 89.8 kg   SpO2 96%   BMI 33.99 kg/m   Visual Acuity Right Eye Distance:   Left Eye Distance:   Bilateral Distance:    Right Eye Near:   Left Eye Near:    Bilateral Near:     Physical Exam Vitals and nursing note reviewed. Exam conducted with a chaperone present.  HENT:     Head: Normocephalic.     Nose: Nose normal.     Mouth/Throat:     Pharynx: Oropharynx is clear.  Eyes:     Pupils: Pupils are equal, round, and reactive to light.  Cardiovascular:     Rate and Rhythm: Normal rate.  Pulmonary:     Effort: Pulmonary effort is normal.  Genitourinary:      Comments: Bilateral inguinal areas and intergluteal cleft have a macular erythematous eruption with moist excoriation at the folds.  No induration, swelling, or fluctuance. Musculoskeletal:     Cervical back: Normal range of motion.  Skin:    General: Skin is warm and dry.  Neurological:     Mental Status: She is alert.      UC Treatments / Results  Labs (all labs ordered are listed, but only abnormal results are displayed) Labs Reviewed - No data to display  EKG   Radiology No results found.  Procedures Procedures (including critical care time)  Medications Ordered in UC Medications - No data to display  Initial Impression / Assessment and Plan / UC Course  I have reviewed the triage vital signs and the nursing notes.  Pertinent labs & imaging results that were available during my care of the patient were reviewed by me and considered in my medical decision making (see chart for details).    Begin ketoconazole cream daily for two weeks.  Begin prednisone burst/taper. Suspect secondary bacterial infection.  Begin doxycycline 100mg  BID Followup with dermatologist if not resolved two weeks. Recommend follow-up with PCP for CPE (?DM)  Final Clinical Impressions(s) / UC Diagnoses    Final diagnoses:  Intertrigo     Discharge Instructions     Try using zinc oxide ointment as a barrier.  Wear soft absorbant cotton underwear.    ED Prescriptions    Medication Sig Dispense Auth. Provider   ketoconazole (NIZORAL) 2 % cream  Apply once daily for two weeks. 60 g Kandra Nicolas, MD   predniSONE (DELTASONE) 20 MG tablet Take one tab by mouth twice daily for 4 days, then one daily for 3 days. Take with food. 11 tablet Kandra Nicolas, MD   doxycycline (VIBRAMYCIN) 100 MG capsule Take 1 capsule (100 mg total) by mouth 2 (two) times daily. Take with food. 14 capsule Kandra Nicolas, MD        Kandra Nicolas, MD 09/21/19 [phone]

## 2019-09-21 NOTE — Discharge Instructions (Addendum)
Try using zinc oxide ointment as a barrier.  Wear soft absorbant cotton underwear.

## 2019-09-27 DIAGNOSIS — I8311 Varicose veins of right lower extremity with inflammation: Secondary | ICD-10-CM | POA: Diagnosis not present

## 2019-09-27 DIAGNOSIS — I8312 Varicose veins of left lower extremity with inflammation: Secondary | ICD-10-CM | POA: Diagnosis not present

## 2019-10-07 DIAGNOSIS — I8312 Varicose veins of left lower extremity with inflammation: Secondary | ICD-10-CM | POA: Diagnosis not present

## 2019-10-07 DIAGNOSIS — I8311 Varicose veins of right lower extremity with inflammation: Secondary | ICD-10-CM | POA: Diagnosis not present

## 2019-10-27 DIAGNOSIS — I8311 Varicose veins of right lower extremity with inflammation: Secondary | ICD-10-CM | POA: Diagnosis not present

## 2019-10-27 DIAGNOSIS — I8312 Varicose veins of left lower extremity with inflammation: Secondary | ICD-10-CM | POA: Diagnosis not present

## 2019-11-04 ENCOUNTER — Ambulatory Visit: Payer: Self-pay

## 2019-11-04 ENCOUNTER — Other Ambulatory Visit: Payer: Self-pay

## 2019-11-05 DIAGNOSIS — G8911 Acute pain due to trauma: Secondary | ICD-10-CM | POA: Diagnosis not present

## 2019-11-05 DIAGNOSIS — M25551 Pain in right hip: Secondary | ICD-10-CM | POA: Diagnosis not present

## 2019-11-05 DIAGNOSIS — Z882 Allergy status to sulfonamides status: Secondary | ICD-10-CM | POA: Diagnosis not present

## 2019-11-05 DIAGNOSIS — X58XXXA Exposure to other specified factors, initial encounter: Secondary | ICD-10-CM | POA: Diagnosis not present

## 2019-11-05 DIAGNOSIS — Z79899 Other long term (current) drug therapy: Secondary | ICD-10-CM | POA: Diagnosis not present

## 2019-11-05 DIAGNOSIS — S72001A Fracture of unspecified part of neck of right femur, initial encounter for closed fracture: Secondary | ICD-10-CM | POA: Diagnosis not present

## 2019-11-05 DIAGNOSIS — M9701XA Periprosthetic fracture around internal prosthetic right hip joint, initial encounter: Secondary | ICD-10-CM | POA: Diagnosis not present

## 2019-11-05 DIAGNOSIS — S728X1A Other fracture of right femur, initial encounter for closed fracture: Secondary | ICD-10-CM | POA: Diagnosis not present

## 2019-11-05 DIAGNOSIS — M47816 Spondylosis without myelopathy or radiculopathy, lumbar region: Secondary | ICD-10-CM | POA: Diagnosis not present

## 2019-11-10 DIAGNOSIS — Z96641 Presence of right artificial hip joint: Secondary | ICD-10-CM | POA: Diagnosis not present

## 2019-11-10 DIAGNOSIS — T84019A Broken internal joint prosthesis, unspecified site, initial encounter: Secondary | ICD-10-CM | POA: Diagnosis not present

## 2019-11-10 DIAGNOSIS — Z01818 Encounter for other preprocedural examination: Secondary | ICD-10-CM | POA: Diagnosis not present

## 2019-11-10 NOTE — H&P (Signed)
TOTAL HIP REVISION ADMISSION H&P  Patient is admitted for right revision total hip arthroplasty.  Subjective:  Chief Complaint: right hip pain  HPI: Melinda Thompson, 69 y.o. female, has a history of pain and functional disability in the right hip due to failed right total hip arthroplasty and patient was found to have a fatigue fracture in the SROM stem. Additionally, patient with history of metal-on-metal bearing surface with already established need for revision. The indications for the revision total hip arthroplasty are fracture or mechanical failure of one or more component and bearing surface wear leading to  symptomatic synovitis.  Onset of symptoms was abrupt starting a week ago ago with stable course since that time.  Prior procedures on the right hip include arthroplasty.  Patient currently rates pain in the right hip at 9 out of 10 with activity.  There is worsening of pain with activity and weight bearing, pain that interfers with activities of daily living and pain with passive range of motion. Patient has evidence of a fatigue fracture in the SROM stem of the right hip. No migration of the distal stem or femur fracture identified by imaging studies.  This condition presents safety issues increasing the risk of falls. There is no current active infection.  Patient Active Problem List   Diagnosis Date Noted  . IRRITABLE BOWEL SYNDROME 03/16/2010  . HIATAL HERNIA 02/12/2010  . DEPRESSION 01/04/2010  . GERD 01/04/2010  . CONSTIPATION 01/04/2010  . CHEST PAIN 01/04/2010  . PERSONAL HISTORY OF ARTHRITIS 01/04/2010   No past medical history on file.  Past Surgical History:  Procedure Laterality Date  . JOINT REPLACEMENT    . TOTAL HIP ARTHROPLASTY      No current facility-administered medications for this encounter.   Current Outpatient Medications  Medication Sig Dispense Refill Last Dose  . acetaminophen (TYLENOL) 500 MG tablet Take 1,000 mg by mouth every 6 (six) hours as  needed for moderate pain.     . multivitamin-iron-minerals-folic acid (CENTRUM) chewable tablet Chew 1 tablet by mouth every morning.     . naproxen sodium (ALEVE) 220 MG tablet Take 440 mg by mouth 2 (two) times daily as needed (pain).     Marland Kitchen sertraline (ZOLOFT) 25 MG tablet Take 12.5 mg by mouth at bedtime.      . SUMAtriptan (IMITREX) 50 MG tablet Take 50 mg by mouth every 2 (two) hours as needed for migraine.     . doxycycline (VIBRAMYCIN) 100 MG capsule Take 1 capsule (100 mg total) by mouth 2 (two) times daily. Take with food. (Patient not taking: Reported on 11/08/2019) 14 capsule 0 Not Taking at Unknown time  . ketoconazole (NIZORAL) 2 % cream Apply once daily for two weeks. (Patient not taking: Reported on 11/08/2019) 60 g 0 Not Taking at Unknown time  . predniSONE (DELTASONE) 20 MG tablet Take one tab by mouth twice daily for 4 days, then one daily for 3 days. Take with food. (Patient not taking: Reported on 11/08/2019) 11 tablet 0 Not Taking at Unknown time   Allergies  Allergen Reactions  . Sulfonamide Derivatives Itching and Other (See Comments)    Social History   Tobacco Use  . Smoking status: Never Smoker  . Smokeless tobacco: Never Used  Substance Use Topics  . Alcohol use: Yes    Comment: rarely    Family History  Problem Relation Age of Onset  . COPD Mother   . Osteoporosis Mother   . Emphysema Mother   .  Hypertension Father   . COPD Father       Review of Systems  Constitutional: Negative for chills and fever.  HENT: Negative for congestion, sore throat and tinnitus.   Eyes: Negative for photophobia and pain.  Respiratory: Negative for cough, shortness of breath and wheezing.   Cardiovascular: Negative for chest pain and palpitations.  Gastrointestinal: Negative for nausea and vomiting.  Genitourinary: Negative for dysuria, frequency and urgency.  Musculoskeletal: Positive for arthralgias.  Neurological: Negative for dizziness, weakness and headaches.     Objective:  Physical Exam  Well nourished and well developed.  General: Alert and oriented x3, cooperative and pleasant, no acute distress.  Head: normocephalic, atraumatic, neck supple.  Eyes: EOMI.  Respiratory: breath sounds clear in all fields, no wheezing, rales, or rhonchi. Cardiovascular: Regular rate and rhythm, no murmurs, gallops or rubs.  Abdomen: non-tender to palpation and soft, normoactive bowel sounds. Musculoskeletal:  Right Hip Exam: Range of motion limited due to pain.  No lesions, ecchymosis, or deformities about the right hip.  Calves soft and nontender. Motor function intact in LE. Strength 5/5 LE bilaterally. Neuro: Distal pulses 2+. Sensation to light touch intact in LE.  Labs: Estimated body mass index is 33.99 kg/m as calculated from the following:   Height as of 09/21/19: 5\' 4"  (1.626 m).   Weight as of 09/21/19: 89.8 kg.  Imaging Review: Radiographs: AP and lateral XR of the right hip obtained from outside clinic shows a fatigue fracture in the SROM stem of the right hip. No migration of the distal stem or femur fracture identified.   Assessment/Plan:  Failed right total hip arthroplasty revision  The patient history, physical examination, clinical judgement of the provider and imaging studies are consistent with end stage degenerative joint disease of the right hip(s), previous total hip arthroplasty. Revision total hip arthroplasty is deemed medically necessary. The treatment options including medical management, injection therapy, arthroscopy and arthroplasty were discussed at length. The risks and benefits of total hip arthroplasty were presented and reviewed. The risks due to aseptic loosening, infection, stiffness, dislocation/subluxation,  thromboembolic complications and other imponderables were discussed.  The patient acknowledged the explanation, agreed to proceed with the plan and consent was signed. Patient is being admitted for inpatient  treatment for surgery, pain control, PT, OT, prophylactic antibiotics, VTE prophylaxis, progressive ambulation and ADL's and discharge planning. The patient is planning to be discharged home.  Therapy Plans: HEP Disposition: Home with friend Planned DVT Prophylaxis: Aspirin 325 mg BID DME Needed: None PCP: Leighton Ruff, MD TXA: IV Allergies: Sulfa (itching), morphine (headache) Anesthesia Concerns: None BMI: 34.8  - Patient was instructed on what medications to stop prior to surgery. - Follow-up visit in 2 weeks with Dr. Wynelle Link - Begin physical therapy following surgery - Pre-operative lab work as pre-surgical testing - Prescriptions will be provided in hospital at time of discharge  Theresa Duty, PA-C Orthopedic Surgery [facility] [place]

## 2019-11-10 NOTE — Patient Instructions (Addendum)
DUE TO COVID-19 ONLY ONE VISITOR IS ALLOWED TO COME WITH YOU AND STAY IN THE WAITING ROOM ONLY DURING PRE OP AND PROCEDURE DAY OF SURGERY. TWO VISITOR MAY VISIT WITH YOU AFTER SURGERY IN YOUR PRIVATE ROOM DURING VISITING HOURS ONLY!   10a-8p  YOU NEED TO HAVE A COVID 19 TEST ON_5-13-21______ @__3 :00 pm_____, THIS TEST MUST BE DONE BEFORE SURGERY, COME  [address], [place] , [address].  ([place]) ONCE YOUR COVID TEST IS COMPLETED, PLEASE BEGIN THE QUARANTINE INSTRUCTIONS AS OUTLINED IN YOUR HANDOUT.                Melinda Thompson  11/10/2019   Your procedure is scheduled on: 11-15-19   Report to [facility] Main  Entrance   Report to admitting at     1:00 PM     Call this number if you have problems the morning of surgery [phone]    Remember: NO SOLID FOOD AFTER MIDNIGHT THE NIGHT PRIOR TO SURGERY. NOTHING BY MOUTH EXCEPT CLEAR LIQUIDS UNTIL  1230 pm . PLEASE FINISH ENSURE DRINK PER SURGEON ORDER  WHICH NEEDS TO BE COMPLETED AT     1230 pm then nothing by mouth.Marland Kitchen    CLEAR LIQUID DIET  Until 12:30 pm then nothing by mouth   Foods Allowed                                                                                           Foods Excluded  Coffee and tea, regular and decaf  NO CREAMER                           liquids that you cannot  Plain Jell-O any favor except red or purple                                           see through such as: Fruit ices (not with fruit pulp)                                                              milk, soups, orange juice  Iced Popsicles                                                            All solid food Carbonated beverages, regular and diet                                    Cranberry, grape and apple juices Sports drinks like Gatorade  Lightly seasoned clear broth or consume(fat free) Sugar, honey syrup  _____________________________________________________________________    BRUSH YOUR TEETH  MORNING OF SURGERY AND RINSE YOUR MOUTH OUT, NO CHEWING GUM CANDY OR MINTS.     Take these medicines the morning of surgery with A SIP OF WATER: tylenol if needed ,hydrocodone if needed, and flexeril if needed                                 You may not have any metal on your body including hair pins and              piercings  Do not wear jewelry, make-up, lotions, powders or perfumes, deodorant             Do not wear nail polish on your fingernails.  Do not shave  48 hours prior to surgery.     Do not bring valuables to the hospital. [place].  Contacts, dentures or bridgework may not be worn into surgery.      Patients discharged the day of surgery will not be allowed to drive home. IF YOU ARE HAVING SURGERY AND GOING HOME THE SAME DAY, YOU MUST HAVE AN ADULT TO DRIVE YOU HOME AND BE WITH YOU FOR 24 HOURS. YOU MAY GO HOME BY TAXI OR UBER OR ORTHERWISE, BUT AN ADULT MUST ACCOMPANY YOU HOME AND STAY WITH YOU FOR 24 HOURS.  Name and phone number of your driver:  Special Instructions: N/A              Please read over the following fact sheets you were given: _____________________________________________________________________             [facility] - Preparing for Surgery Before surgery, you can play an important role.  Because skin is not sterile, your skin needs to be as free of germs as possible.  You can reduce the number of germs on your skin by washing with CHG (chlorahexidine gluconate) soap before surgery.  CHG is an antiseptic cleaner which kills germs and bonds with the skin to continue killing germs even after washing. Please DO NOT use if you have an allergy to CHG or antibacterial soaps.  If your skin becomes reddened/irritated stop using the CHG and inform your nurse when you arrive at Short Stay. Do not shave (including legs and underarms) for at least 48 hours prior to the first CHG shower.  You may shave your  face/neck. Please follow these instructions carefully:  1.  Shower with CHG Soap the night before surgery and the  morning of Surgery.  2.  If you choose to wash your hair, wash your hair first as usual with your  normal  shampoo.  3.  After you shampoo, rinse your hair and body thoroughly to remove the  shampoo.                           4.  Use CHG as you would any other liquid soap.  You can apply chg directly  to the skin and wash                       Gently with a scrungie or clean washcloth.  5.  Apply the CHG Soap to your body ONLY  FROM THE NECK DOWN.   Do not use on face/ open                           Wound or open sores. Avoid contact with eyes, ears mouth and genitals (private parts).                       Wash face,  Genitals (private parts) with your normal soap.             6.  Wash thoroughly, paying special attention to the area where your surgery  will be performed.  7.  Thoroughly rinse your body with warm water from the neck down.  8.  DO NOT shower/wash with your normal soap after using and rinsing off  the CHG Soap.                9.  Pat yourself dry with a clean towel.            10.  Wear clean pajamas.            11.  Place clean sheets on your bed the night of your first shower and do not  sleep with pets. Day of Surgery : Do not apply any lotions/deodorants the morning of surgery.  Please wear clean clothes to the hospital/surgery center.  FAILURE TO FOLLOW THESE INSTRUCTIONS MAY RESULT IN THE CANCELLATION OF YOUR SURGERY PATIENT SIGNATURE_________________________________  NURSE SIGNATURE__________________________________  ________________________________________________________________________   Adam Phenix  An incentive spirometer is a tool that can help keep your lungs clear and active. This tool measures how well you are filling your lungs with each breath. Taking long deep breaths may help reverse or decrease the chance of developing breathing  (pulmonary) problems (especially infection) following:  A long period of time when you are unable to move or be active. BEFORE THE PROCEDURE   If the spirometer includes an indicator to show your best effort, your nurse or respiratory therapist will set it to a desired goal.  If possible, sit up straight or lean slightly forward. Try not to slouch.  Hold the incentive spirometer in an upright position. INSTRUCTIONS FOR USE  1. Sit on the edge of your bed if possible, or sit up as far as you can in bed or on a chair. 2. Hold the incentive spirometer in an upright position. 3. Breathe out normally. 4. Place the mouthpiece in your mouth and seal your lips tightly around it. 5. Breathe in slowly and as deeply as possible, raising the piston or the ball toward the top of the column. 6. Hold your breath for 3-5 seconds or for as long as possible. Allow the piston or ball to fall to the bottom of the column. 7. Remove the mouthpiece from your mouth and breathe out normally. 8. Rest for a few seconds and repeat Steps 1 through 7 at least 10 times every 1-2 hours when you are awake. Take your time and take a few normal breaths between deep breaths. 9. The spirometer may include an indicator to show your best effort. Use the indicator as a goal to work toward during each repetition. 10. After each set of 10 deep breaths, practice coughing to be sure your lungs are clear. If you have an incision (the cut made at the time of surgery), support your incision when coughing by placing a pillow or rolled up towels firmly against it. Once you  are able to get out of bed, walk around indoors and cough well. You may stop using the incentive spirometer when instructed by your caregiver.  RISKS AND COMPLICATIONS  Take your time so you do not get dizzy or light-headed.  If you are in pain, you may need to take or ask for pain medication before doing incentive spirometry. It is harder to take a deep breath if you  are having pain. AFTER USE  Rest and breathe slowly and easily.  It can be helpful to keep track of a log of your progress. Your caregiver can provide you with a simple table to help with this. If you are using the spirometer at home, follow these instructions: [place] IF:   You are having difficultly using the spirometer.  You have trouble using the spirometer as often as instructed.  Your pain medication is not giving enough relief while using the spirometer.  You develop fever of 100.5 F (38.1 C) or higher. SEEK IMMEDIATE MEDICAL CARE IF:   You cough up bloody sputum that had not been present before.  You develop fever of 102 F (38.9 C) or greater.  You develop worsening pain at or near the incision site. MAKE SURE YOU:   Understand these instructions.  Will watch your condition.  Will get help right away if you are not doing well or get worse. Document Released: 10/28/2006 Document Revised: 09/09/2011 Document Reviewed: 12/29/2006 [employer] Patient Information 2014 [employer], [place].   ________________________________________________________________________  WHAT IS A BLOOD TRANSFUSION? Blood Transfusion Information  A transfusion is the replacement of blood or some of its parts. Blood is made up of multiple cells which provide different functions.  Red blood cells carry oxygen and are used for blood loss replacement.  White blood cells fight against infection.  Platelets control bleeding.  Plasma helps clot blood.  Other blood products are available for specialized needs, such as hemophilia or other clotting disorders. BEFORE THE TRANSFUSION  Who gives blood for transfusions?   Healthy volunteers who are fully evaluated to make sure their blood is safe. This is blood bank blood. Transfusion therapy is the safest it has ever been in the practice of medicine. Before blood is taken from a donor, a complete history is taken to make sure that person has  no history of diseases nor engages in risky social behavior (examples are intravenous drug use or sexual activity with multiple partners). The donor's travel history is screened to minimize risk of transmitting infections, such as malaria. The donated blood is tested for signs of infectious diseases, such as HIV and hepatitis. The blood is then tested to be sure it is compatible with you in order to minimize the chance of a transfusion reaction. If you or a relative donates blood, this is often done in anticipation of surgery and is not appropriate for emergency situations. It takes many days to process the donated blood. RISKS AND COMPLICATIONS Although transfusion therapy is very safe and saves many lives, the main dangers of transfusion include:   Getting an infectious disease.  Developing a transfusion reaction. This is an allergic reaction to something in the blood you were given. Every precaution is taken to prevent this. The decision to have a blood transfusion has been considered carefully by your caregiver before blood is given. Blood is not given unless the benefits outweigh the risks. AFTER THE TRANSFUSION  Right after receiving a blood transfusion, you will usually feel much better and more energetic. This is  especially true if your red blood cells have gotten low (anemic). The transfusion raises the level of the red blood cells which carry oxygen, and this usually causes an energy increase.  The nurse administering the transfusion will monitor you carefully for complications. HOME CARE INSTRUCTIONS  No special instructions are needed after a transfusion. You may find your energy is better. Speak with your caregiver about any limitations on activity for underlying diseases you may have. SEEK MEDICAL CARE IF:   Your condition is not improving after your transfusion.  You develop redness or irritation at the intravenous (IV) site. SEEK IMMEDIATE MEDICAL CARE IF:  Any of the following  symptoms occur over the next 12 hours:  Shaking chills.  You have a temperature by mouth above 102 F (38.9 C), not controlled by medicine.  Chest, back, or muscle pain.  People around you feel you are not acting correctly or are confused.  Shortness of breath or difficulty breathing.  Dizziness and fainting.  You get a rash or develop hives.  You have a decrease in urine output.  Your urine turns a dark color or changes to pink, red, or brown. Any of the following symptoms occur over the next 10 days:  You have a temperature by mouth above 102 F (38.9 C), not controlled by medicine.  Shortness of breath.  Weakness after normal activity.  The white part of the eye turns yellow (jaundice).  You have a decrease in the amount of urine or are urinating less often.  Your urine turns a dark color or changes to pink, red, or brown. Document Released: 06/14/2000 Document Revised: 09/09/2011 Document Reviewed: 02/01/2008 [facility] Patient Information 2014 [place], [place].  _______________________________________________________________________

## 2019-11-11 ENCOUNTER — Encounter (HOSPITAL_COMMUNITY)
Admission: RE | Admit: 2019-11-11 | Discharge: 2019-11-11 | Disposition: A | Discharge status: Home / Self Care | Payer: Medicare HMO | Source: Ambulatory Visit | Attending: Orthopedic Surgery | Admitting: Orthopedic Surgery

## 2019-11-11 ENCOUNTER — Other Ambulatory Visit: Payer: Self-pay

## 2019-11-11 ENCOUNTER — Other Ambulatory Visit (HOSPITAL_COMMUNITY)
Admission: RE | Admit: 2019-11-11 | Discharge: 2019-11-11 | Disposition: A | Discharge status: Home / Self Care | Payer: Medicare HMO | Source: Ambulatory Visit | Attending: Orthopedic Surgery | Admitting: Orthopedic Surgery

## 2019-11-11 ENCOUNTER — Encounter (HOSPITAL_COMMUNITY): Payer: Self-pay

## 2019-11-11 DIAGNOSIS — Z01812 Encounter for preprocedural laboratory examination: Secondary | ICD-10-CM | POA: Diagnosis present

## 2019-11-11 DIAGNOSIS — Z20822 Contact with and (suspected) exposure to covid-19: Secondary | ICD-10-CM | POA: Diagnosis not present

## 2019-11-11 HISTORY — DX: Anxiety disorder, unspecified: F41.9

## 2019-11-11 HISTORY — DX: Gastro-esophageal reflux disease without esophagitis: K21.9

## 2019-11-11 HISTORY — DX: Personal history of other diseases of the digestive system: Z87.19

## 2019-11-11 HISTORY — DX: Headache, unspecified: R51.9

## 2019-11-11 HISTORY — DX: Unspecified osteoarthritis, unspecified site: M19.90

## 2019-11-11 LAB — COMPREHENSIVE METABOLIC PANEL
ALT: 18 U/L (ref 0–44)
AST: 16 U/L (ref 15–41)
Albumin: 3.4 g/dL — ABNORMAL LOW (ref 3.5–5.0)
Alkaline Phosphatase: 119 U/L (ref 38–126)
Anion gap: 11 (ref 5–15)
BUN: 13 mg/dL (ref 8–23)
CO2: 28 mmol/L (ref 22–32)
Calcium: 9.7 mg/dL (ref 8.9–10.3)
Chloride: 102 mmol/L (ref 98–111)
Creatinine, Ser: 0.8 mg/dL (ref 0.44–1.00)
GFR calc Af Amer: >60 mL/min (ref >60)
GFR calc non Af Amer: >60 mL/min (ref >60)
Glucose, Bld: 99 mg/dL (ref 70–99)
Potassium: 3.7 mmol/L (ref 3.5–5.1)
Sodium: 141 mmol/L (ref 135–145)
Total Bilirubin: 0.6 mg/dL (ref 0.3–1.2)
Total Protein: 7.9 g/dL (ref 6.5–8.1)

## 2019-11-11 LAB — PROTIME-INR
INR: 1.2 (ref 0.8–1.2)
Prothrombin Time: 14.3 seconds (ref 11.4–15.2)

## 2019-11-11 LAB — CBC
HCT: 39.3 % (ref 36.0–46.0)
Hemoglobin: 12.6 g/dL (ref 12.0–15.0)
MCH: 29.8 pg (ref 26.0–34.0)
MCHC: 32.1 g/dL (ref 30.0–36.0)
MCV: 92.9 fL (ref 80.0–100.0)
Platelets: 382 10*3/uL (ref 150–400)
RBC: 4.23 MIL/uL (ref 3.87–5.11)
RDW: 13.2 % (ref 11.5–15.5)
WBC: 9 10*3/uL (ref 4.0–10.5)
nRBC: 0 % (ref 0.0–0.2)

## 2019-11-11 LAB — SURGICAL PCR SCREEN
MRSA, PCR: NEGATIVE
Staphylococcus aureus: NEGATIVE

## 2019-11-11 LAB — SARS CORONAVIRUS 2 (TAT 6-24 HRS): SARS Coronavirus 2: NEGATIVE

## 2019-11-11 LAB — APTT: aPTT: 32 seconds (ref 24–36)

## 2019-11-12 NOTE — Progress Notes (Signed)
PCP - Leighton Ruff Cardiologist -   Chest x-ray -  EKG -  Stress Test -  ECHO -  Cardiac Cath -   Sleep Study -  CPAP -   Fasting Blood Sugar -  Checks Blood Sugar _____ times a day  Blood Thinner Instructions: Aspirin Instructions: Last Dose:  Anesthesia review:   Patient denies shortness of breath, fever, cough and chest pain at PAT appointment  NONE   Patient verbalized understanding of instructions that were given to them at the PAT appointment. Patient was also instructed that they will need to review over the PAT instructions again at home before surgery.

## 2019-11-15 ENCOUNTER — Inpatient Hospital Stay (HOSPITAL_COMMUNITY): Payer: Medicare HMO | Admitting: Anesthesiology

## 2019-11-15 ENCOUNTER — Other Ambulatory Visit: Payer: Self-pay

## 2019-11-15 ENCOUNTER — Inpatient Hospital Stay (HOSPITAL_COMMUNITY): Payer: Medicare HMO

## 2019-11-15 ENCOUNTER — Inpatient Hospital Stay (HOSPITAL_COMMUNITY)
Admission: RE | Admit: 2019-11-15 | Discharge: 2019-11-19 | DRG: 467 | Disposition: A | Discharge status: Home Health | Payer: Medicare HMO | Attending: Orthopedic Surgery | Admitting: Orthopedic Surgery

## 2019-11-15 ENCOUNTER — Encounter (HOSPITAL_COMMUNITY): Payer: Self-pay | Admitting: Orthopedic Surgery

## 2019-11-15 ENCOUNTER — Encounter (HOSPITAL_COMMUNITY)
Admission: RE | Disposition: A | Discharge status: Home Health | Payer: Self-pay | Source: Home / Self Care | Attending: Orthopedic Surgery

## 2019-11-15 DIAGNOSIS — F329 Major depressive disorder, single episode, unspecified: Secondary | ICD-10-CM | POA: Diagnosis present

## 2019-11-15 DIAGNOSIS — K219 Gastro-esophageal reflux disease without esophagitis: Secondary | ICD-10-CM | POA: Diagnosis present

## 2019-11-15 DIAGNOSIS — T84090A Other mechanical complication of internal right hip prosthesis, initial encounter: Principal | ICD-10-CM | POA: Diagnosis present

## 2019-11-15 DIAGNOSIS — R69 Illness, unspecified: Secondary | ICD-10-CM | POA: Diagnosis not present

## 2019-11-15 DIAGNOSIS — R110 Nausea: Secondary | ICD-10-CM | POA: Diagnosis not present

## 2019-11-15 DIAGNOSIS — Z8249 Family history of ischemic heart disease and other diseases of the circulatory system: Secondary | ICD-10-CM | POA: Diagnosis not present

## 2019-11-15 DIAGNOSIS — Z825 Family history of asthma and other chronic lower respiratory diseases: Secondary | ICD-10-CM

## 2019-11-15 DIAGNOSIS — Y792 Prosthetic and other implants, materials and accessory orthopedic devices associated with adverse incidents: Secondary | ICD-10-CM | POA: Diagnosis not present

## 2019-11-15 DIAGNOSIS — Z96641 Presence of right artificial hip joint: Secondary | ICD-10-CM | POA: Diagnosis not present

## 2019-11-15 DIAGNOSIS — Z882 Allergy status to sulfonamides status: Secondary | ICD-10-CM

## 2019-11-15 DIAGNOSIS — Z791 Long term (current) use of non-steroidal anti-inflammatories (NSAID): Secondary | ICD-10-CM

## 2019-11-15 DIAGNOSIS — D62 Acute posthemorrhagic anemia: Secondary | ICD-10-CM | POA: Diagnosis not present

## 2019-11-15 DIAGNOSIS — Z8739 Personal history of other diseases of the musculoskeletal system and connective tissue: Secondary | ICD-10-CM

## 2019-11-15 DIAGNOSIS — Z79899 Other long term (current) drug therapy: Secondary | ICD-10-CM | POA: Diagnosis not present

## 2019-11-15 DIAGNOSIS — T84011A Broken internal left hip prosthesis, initial encounter: Secondary | ICD-10-CM | POA: Diagnosis not present

## 2019-11-15 DIAGNOSIS — S72141A Displaced intertrochanteric fracture of right femur, initial encounter for closed fracture: Secondary | ICD-10-CM | POA: Diagnosis not present

## 2019-11-15 DIAGNOSIS — K449 Diaphragmatic hernia without obstruction or gangrene: Secondary | ICD-10-CM | POA: Diagnosis not present

## 2019-11-15 DIAGNOSIS — Z419 Encounter for procedure for purposes other than remedying health state, unspecified: Secondary | ICD-10-CM

## 2019-11-15 DIAGNOSIS — T84018A Broken internal joint prosthesis, other site, initial encounter: Secondary | ICD-10-CM

## 2019-11-15 DIAGNOSIS — F419 Anxiety disorder, unspecified: Secondary | ICD-10-CM | POA: Diagnosis present

## 2019-11-15 DIAGNOSIS — Z8262 Family history of osteoporosis: Secondary | ICD-10-CM

## 2019-11-15 DIAGNOSIS — K5900 Constipation, unspecified: Secondary | ICD-10-CM | POA: Diagnosis not present

## 2019-11-15 DIAGNOSIS — Z96649 Presence of unspecified artificial hip joint: Secondary | ICD-10-CM

## 2019-11-15 DIAGNOSIS — Z471 Aftercare following joint replacement surgery: Secondary | ICD-10-CM | POA: Diagnosis not present

## 2019-11-15 DIAGNOSIS — T8484XA Pain due to internal orthopedic prosthetic devices, implants and grafts, initial encounter: Secondary | ICD-10-CM | POA: Diagnosis not present

## 2019-11-15 HISTORY — PX: TOTAL HIP REVISION: SHX763

## 2019-11-15 LAB — POCT I-STAT, CHEM 8
BUN: 6 mg/dL — ABNORMAL LOW (ref 8–23)
Calcium, Ion: 1.24 mmol/L (ref 1.15–1.40)
Chloride: 100 mmol/L (ref 98–111)
Creatinine, Ser: 0.6 mg/dL (ref 0.44–1.00)
Glucose, Bld: 91 mg/dL (ref 70–99)
HCT: 29 % — ABNORMAL LOW (ref 36.0–46.0)
Hemoglobin: 9.9 g/dL — ABNORMAL LOW (ref 12.0–15.0)
Potassium: 3.6 mmol/L (ref 3.5–5.1)
Sodium: 138 mmol/L (ref 135–145)
TCO2: 26 mmol/L (ref 22–32)

## 2019-11-15 LAB — PREPARE RBC (CROSSMATCH)

## 2019-11-15 SURGERY — TOTAL HIP REVISION
Anesthesia: General | Site: Hip | Laterality: Right

## 2019-11-15 MED ORDER — SUFENTANIL CITRATE 50 MCG/ML IV SOLN
INTRAVENOUS | Status: AC
  Filled 2019-11-15: qty 1

## 2019-11-15 MED ORDER — ONDANSETRON HCL 4 MG/2ML IJ SOLN: 4.0000 mg | Freq: Four times a day (QID) | INTRAMUSCULAR | Status: DC | PRN

## 2019-11-15 MED ORDER — ROCURONIUM BROMIDE 100 MG/10ML IV SOLN
INTRAVENOUS | Status: DC | PRN
  Administered 2019-11-15: 20 mg via INTRAVENOUS
  Administered 2019-11-15: 70 mg via INTRAVENOUS

## 2019-11-15 MED ORDER — SUFENTANIL CITRATE 50 MCG/ML IV SOLN
INTRAVENOUS | Status: DC | PRN
  Administered 2019-11-15 (×10): 10 ug via INTRAVENOUS

## 2019-11-15 MED ORDER — PROPOFOL 10 MG/ML IV BOLUS
INTRAVENOUS | Status: DC | PRN
  Administered 2019-11-15: 160 mg via INTRAVENOUS
  Administered 2019-11-15 (×2): 20 mg via INTRAVENOUS

## 2019-11-15 MED ORDER — MORPHINE SULFATE (PF) 2 MG/ML IV SOLN: 0.5000 mg | INTRAVENOUS | Status: DC | PRN

## 2019-11-15 MED ORDER — ONDANSETRON HCL 4 MG/2ML IJ SOLN
INTRAMUSCULAR | Status: AC
  Filled 2019-11-15: qty 2

## 2019-11-15 MED ORDER — FENTANYL CITRATE (PF) 100 MCG/2ML IJ SOLN
INTRAMUSCULAR | Status: DC | PRN
  Administered 2019-11-15: 100 ug via INTRAVENOUS
  Administered 2019-11-15: 150 ug via INTRAVENOUS

## 2019-11-15 MED ORDER — METOCLOPRAMIDE HCL 5 MG PO TABS: 5.0000 mg | ORAL_TABLET | Freq: Three times a day (TID) | ORAL | Status: DC | PRN

## 2019-11-15 MED ORDER — HYDROMORPHONE HCL 1 MG/ML IJ SOLN
INTRAMUSCULAR | Status: DC | PRN
  Administered 2019-11-15 (×2): 1 mg via INTRAVENOUS

## 2019-11-15 MED ORDER — MIDAZOLAM HCL 2 MG/2ML IJ SOLN
INTRAMUSCULAR | Status: AC
  Filled 2019-11-15: qty 2

## 2019-11-15 MED ORDER — KETAMINE HCL 10 MG/ML IJ SOLN
INTRAMUSCULAR | Status: AC
  Filled 2019-11-15: qty 1

## 2019-11-15 MED ORDER — SODIUM CHLORIDE 0.9 % IR SOLN
Status: DC | PRN
  Administered 2019-11-15: 1000 mL

## 2019-11-15 MED ORDER — LACTATED RINGERS IV SOLN: INTRAVENOUS | Status: DC

## 2019-11-15 MED ORDER — CEFAZOLIN SODIUM-DEXTROSE 2-4 GM/100ML-% IV SOLN
2.0000 g | INTRAVENOUS | Status: AC
  Administered 2019-11-15: 2 mg via INTRAVENOUS
  Filled 2019-11-15: qty 100

## 2019-11-15 MED ORDER — ASPIRIN EC 325 MG PO TBEC
325.0000 mg | DELAYED_RELEASE_TABLET | Freq: Two times a day (BID) | ORAL | Status: DC
  Administered 2019-11-16 – 2019-11-19 (×7): 325 mg via ORAL
  Filled 2019-11-15 (×7): qty 1

## 2019-11-15 MED ORDER — CEFAZOLIN SODIUM-DEXTROSE 2-4 GM/100ML-% IV SOLN
2.0000 g | Freq: Four times a day (QID) | INTRAVENOUS | Status: AC
  Administered 2019-11-15 – 2019-11-16 (×2): 2 g via INTRAVENOUS
  Filled 2019-11-15 (×2): qty 100

## 2019-11-15 MED ORDER — POVIDONE-IODINE 10 % EX SWAB
2.0000 "application " | Freq: Once | CUTANEOUS | Status: AC
  Administered 2019-11-15: 2 via TOPICAL

## 2019-11-15 MED ORDER — SUGAMMADEX SODIUM 200 MG/2ML IV SOLN
INTRAVENOUS | Status: DC | PRN
Start: 2019-11-15 — End: 2019-11-15
  Administered 2019-11-15: 200 mg via INTRAVENOUS

## 2019-11-15 MED ORDER — MENTHOL 3 MG MT LOZG: 1.0000 | LOZENGE | OROMUCOSAL | Status: DC | PRN

## 2019-11-15 MED ORDER — PHENYLEPHRINE 40 MCG/ML (10ML) SYRINGE FOR IV PUSH (FOR BLOOD PRESSURE SUPPORT)
PREFILLED_SYRINGE | INTRAVENOUS | Status: AC
  Filled 2019-11-15: qty 10

## 2019-11-15 MED ORDER — PROPOFOL 10 MG/ML IV BOLUS
INTRAVENOUS | Status: AC
  Filled 2019-11-15: qty 20

## 2019-11-15 MED ORDER — DEXAMETHASONE SODIUM PHOSPHATE 10 MG/ML IJ SOLN
INTRAMUSCULAR | Status: AC
  Filled 2019-11-15: qty 1

## 2019-11-15 MED ORDER — ACETAMINOPHEN 325 MG PO TABS: 325.0000 mg | ORAL_TABLET | Freq: Four times a day (QID) | ORAL | Status: DC | PRN

## 2019-11-15 MED ORDER — STERILE WATER FOR IRRIGATION IR SOLN
Status: DC | PRN
  Administered 2019-11-15: 2000 mL

## 2019-11-15 MED ORDER — METOCLOPRAMIDE HCL 5 MG/ML IJ SOLN: 5.0000 mg | Freq: Three times a day (TID) | INTRAMUSCULAR | Status: DC | PRN

## 2019-11-15 MED ORDER — HYDROMORPHONE HCL 1 MG/ML IJ SOLN
0.2500 mg | INTRAMUSCULAR | Status: DC | PRN
  Administered 2019-11-15 (×2): 0.5 mg via INTRAVENOUS

## 2019-11-15 MED ORDER — ACETAMINOPHEN 10 MG/ML IV SOLN
INTRAVENOUS | Status: DC | PRN
  Administered 2019-11-15: 1000 mg via INTRAVENOUS

## 2019-11-15 MED ORDER — HYDROCODONE-ACETAMINOPHEN 7.5-325 MG PO TABS
1.0000 | ORAL_TABLET | ORAL | Status: DC | PRN
  Administered 2019-11-18 (×4): 2 via ORAL
  Administered 2019-11-19: 1 via ORAL
  Filled 2019-11-15 (×4): qty 2
  Filled 2019-11-15: qty 1

## 2019-11-15 MED ORDER — ACETAMINOPHEN 10 MG/ML IV SOLN: 1000.0000 mg | Freq: Once | INTRAVENOUS | Status: DC | PRN

## 2019-11-15 MED ORDER — DEXAMETHASONE SODIUM PHOSPHATE 10 MG/ML IJ SOLN
8.0000 mg | Freq: Once | INTRAMUSCULAR | Status: AC
  Administered 2019-11-15: 10 mg via INTRAVENOUS

## 2019-11-15 MED ORDER — POLYETHYLENE GLYCOL 3350 17 G PO PACK: 17.0000 g | PACK | Freq: Every day | ORAL | Status: DC | PRN

## 2019-11-15 MED ORDER — DOCUSATE SODIUM 100 MG PO CAPS
100.0000 mg | ORAL_CAPSULE | Freq: Two times a day (BID) | ORAL | Status: DC
  Administered 2019-11-15 – 2019-11-19 (×8): 100 mg via ORAL
  Filled 2019-11-15 (×7): qty 1

## 2019-11-15 MED ORDER — PHENOL 1.4 % MT LIQD: 1.0000 | OROMUCOSAL | Status: DC | PRN

## 2019-11-15 MED ORDER — SODIUM CHLORIDE 0.9 % IV SOLN: INTRAVENOUS | Status: DC

## 2019-11-15 MED ORDER — ONDANSETRON HCL 4 MG PO TABS: 4.0000 mg | ORAL_TABLET | Freq: Four times a day (QID) | ORAL | Status: DC | PRN

## 2019-11-15 MED ORDER — ACETAMINOPHEN 10 MG/ML IV SOLN
1000.0000 mg | Freq: Four times a day (QID) | INTRAVENOUS | Status: DC
  Filled 2019-11-15: qty 100

## 2019-11-15 MED ORDER — HYDROMORPHONE HCL 2 MG/ML IJ SOLN
INTRAMUSCULAR | Status: AC
  Filled 2019-11-15: qty 1

## 2019-11-15 MED ORDER — HYDROMORPHONE HCL 1 MG/ML IJ SOLN
INTRAMUSCULAR | Status: AC
  Filled 2019-11-15: qty 1

## 2019-11-15 MED ORDER — PHENYLEPHRINE 40 MCG/ML (10ML) SYRINGE FOR IV PUSH (FOR BLOOD PRESSURE SUPPORT)
PREFILLED_SYRINGE | INTRAVENOUS | Status: DC | PRN
  Administered 2019-11-15: 80 ug via INTRAVENOUS

## 2019-11-15 MED ORDER — LIDOCAINE HCL (CARDIAC) PF 50 MG/5ML IV SOSY
PREFILLED_SYRINGE | INTRAVENOUS | Status: DC | PRN
  Administered 2019-11-15: 100 mg via INTRAVENOUS

## 2019-11-15 MED ORDER — KETAMINE HCL 10 MG/ML IJ SOLN
INTRAMUSCULAR | Status: DC | PRN
  Administered 2019-11-15: 50 mg via INTRAVENOUS
  Administered 2019-11-15: 20 mg via INTRAVENOUS
  Administered 2019-11-15: 10 mg via INTRAVENOUS

## 2019-11-15 MED ORDER — BUPIVACAINE HCL 0.25 % IJ SOLN
INTRAMUSCULAR | Status: AC
  Filled 2019-11-15: qty 1

## 2019-11-15 MED ORDER — SODIUM CHLORIDE 0.9 % IV SOLN: INTRAVENOUS | Status: DC | PRN

## 2019-11-15 MED ORDER — MIDAZOLAM HCL 5 MG/5ML IJ SOLN
INTRAMUSCULAR | Status: DC | PRN
  Administered 2019-11-15: 2 mg via INTRAVENOUS

## 2019-11-15 MED ORDER — METHOCARBAMOL 500 MG PO TABS
500.0000 mg | ORAL_TABLET | Freq: Four times a day (QID) | ORAL | Status: DC | PRN
  Administered 2019-11-15 – 2019-11-19 (×11): 500 mg via ORAL
  Filled 2019-11-15 (×11): qty 1

## 2019-11-15 MED ORDER — BISACODYL 10 MG RE SUPP: 10.0000 mg | Freq: Every day | RECTAL | Status: DC | PRN

## 2019-11-15 MED ORDER — MAGNESIUM CITRATE PO SOLN: 1.0000 | Freq: Once | ORAL | Status: DC | PRN

## 2019-11-15 MED ORDER — CYCLOBENZAPRINE HCL 10 MG PO TABS
10.0000 mg | ORAL_TABLET | Freq: Two times a day (BID) | ORAL | Status: DC
  Administered 2019-11-15 – 2019-11-19 (×8): 10 mg via ORAL
  Filled 2019-11-15 (×8): qty 1

## 2019-11-15 MED ORDER — FENTANYL CITRATE (PF) 250 MCG/5ML IJ SOLN
INTRAMUSCULAR | Status: AC
  Filled 2019-11-15: qty 5

## 2019-11-15 MED ORDER — DEXAMETHASONE SODIUM PHOSPHATE 10 MG/ML IJ SOLN
10.0000 mg | Freq: Once | INTRAMUSCULAR | Status: AC
  Administered 2019-11-16: 10 mg via INTRAVENOUS
  Filled 2019-11-15: qty 1

## 2019-11-15 MED ORDER — SERTRALINE HCL 25 MG PO TABS
12.5000 mg | ORAL_TABLET | Freq: Every day | ORAL | Status: DC
  Administered 2019-11-15 – 2019-11-18 (×4): 12.5 mg via ORAL
  Filled 2019-11-15 (×4): qty 1

## 2019-11-15 MED ORDER — HYDROCODONE-ACETAMINOPHEN 5-325 MG PO TABS
1.0000 | ORAL_TABLET | ORAL | Status: DC | PRN
  Administered 2019-11-16 – 2019-11-19 (×10): 2 via ORAL
  Filled 2019-11-15 (×10): qty 2

## 2019-11-15 MED ORDER — BUPIVACAINE HCL 0.25 % IJ SOLN
INTRAMUSCULAR | Status: DC | PRN
  Administered 2019-11-15: 50 mL

## 2019-11-15 MED ORDER — LACTATED RINGERS IV SOLN: INTRAVENOUS | Status: DC | PRN

## 2019-11-15 MED ORDER — METHOCARBAMOL 500 MG IVPB - SIMPLE MED
500.0000 mg | Freq: Four times a day (QID) | INTRAVENOUS | Status: DC | PRN
  Filled 2019-11-15: qty 50

## 2019-11-15 MED ORDER — TRANEXAMIC ACID-NACL 1000-0.7 MG/100ML-% IV SOLN
1000.0000 mg | INTRAVENOUS | Status: AC
  Administered 2019-11-15: 1000 mg via INTRAVENOUS
  Filled 2019-11-15: qty 100

## 2019-11-15 SURGICAL SUPPLY — 70 items
BAG DECANTER FOR FLEXI CONT (MISCELLANEOUS) ×3 IMPLANT
BAG SPEC THK2 15X12 ZIP CLS (MISCELLANEOUS) ×2
BAG ZIPLOCK 12X15 (MISCELLANEOUS) ×6 IMPLANT
BIT DRILL 2.8X128 (BIT) ×2 IMPLANT
BIT DRILL 2.8X128MM (BIT) ×1
BLADE SAW SAG 73X25 THK (BLADE)
BLADE SAW SGTL 73X25 THK (BLADE) IMPLANT
CABLE CERLAGE W/CRIMP 1.8 (Cable) ×4 IMPLANT
CABLE CERLAGE W/CRIMP 1.8MM (Cable) ×4 IMPLANT
CLOSURE WOUND 1/2 X4 (GAUZE/BANDAGES/DRESSINGS) ×1
COVER SURGICAL LIGHT HANDLE (MISCELLANEOUS) ×3 IMPLANT
COVER WAND RF STERILE (DRAPES) IMPLANT
DRAPE INCISE IOBAN 66X45 STRL (DRAPES) ×3 IMPLANT
DRAPE ORTHO SPLIT 77X108 STRL (DRAPES) ×6
DRAPE POUCH INSTRU U-SHP 10X18 (DRAPES) ×3 IMPLANT
DRAPE SURG ORHT 6 SPLT 77X108 (DRAPES) ×2 IMPLANT
DRAPE U-SHAPE 47X51 STRL (DRAPES) ×3 IMPLANT
DRSG AQUACEL AG ADV 3.5X 4 (GAUZE/BANDAGES/DRESSINGS) ×2 IMPLANT
DRSG AQUACEL AG ADV 3.5X14 (GAUZE/BANDAGES/DRESSINGS) ×2 IMPLANT
DRSG EMULSION OIL 3X16 NADH (GAUZE/BANDAGES/DRESSINGS) ×3 IMPLANT
DRSG MEPILEX BORDER 4X4 (GAUZE/BANDAGES/DRESSINGS) ×6 IMPLANT
DRSG MEPILEX BORDER 4X8 (GAUZE/BANDAGES/DRESSINGS) ×3 IMPLANT
DURAPREP 26ML APPLICATOR (WOUND CARE) ×3 IMPLANT
ELECT REM PT RETURN 15FT ADLT (MISCELLANEOUS) ×3 IMPLANT
EVACUATOR 1/8 PVC DRAIN (DRAIN) ×3 IMPLANT
FACESHIELD WRAPAROUND (MASK) ×12 IMPLANT
FACESHIELD WRAPAROUND OR TEAM (MASK) ×4 IMPLANT
GAUZE SPONGE 4X4 12PLY STRL (GAUZE/BANDAGES/DRESSINGS) ×3 IMPLANT
GLOVE BIO SURGEON STRL SZ7 (GLOVE) ×3 IMPLANT
GLOVE BIO SURGEON STRL SZ8 (GLOVE) ×3 IMPLANT
GLOVE BIOGEL PI IND STRL 7.0 (GLOVE) ×1 IMPLANT
GLOVE BIOGEL PI IND STRL 8 (GLOVE) ×1 IMPLANT
GLOVE BIOGEL PI INDICATOR 7.0 (GLOVE) ×2
GLOVE BIOGEL PI INDICATOR 8 (GLOVE) ×2
GOWN STRL REUS W/TWL LRG LVL3 (GOWN DISPOSABLE) ×6 IMPLANT
HANDPIECE INTERPULSE COAX TIP (DISPOSABLE)
HEAD CERAMIC 36 PLUS 8.5 12 14 (Hips) ×2 IMPLANT
IMMOBILIZER KNEE 20 (SOFTGOODS) ×3
IMMOBILIZER KNEE 20 THIGH 36 (SOFTGOODS) IMPLANT
KIT BASIN (CUSTOM PROCEDURE TRAY) ×3 IMPLANT
KIT TURNOVER KIT A (KITS) IMPLANT
LINER MARATHON 4 NEUTRAL 36X56 (Hips) ×2 IMPLANT
MANIFOLD NEPTUNE II (INSTRUMENTS) ×3 IMPLANT
NDL SAFETY ECLIPSE 18X1.5 (NEEDLE) ×1 IMPLANT
NEEDLE HYPO 18GX1.5 SHARP (NEEDLE) ×3
NS IRRIG 1000ML POUR BTL (IV SOLUTION) ×3 IMPLANT
PACK TOTAL JOINT (CUSTOM PROCEDURE TRAY) ×3 IMPLANT
PASSER SUT SWANSON 36MM LOOP (INSTRUMENTS) IMPLANT
PENCIL SMOKE EVACUATOR COATED (MISCELLANEOUS) ×3 IMPLANT
PROTECTOR NERVE ULNAR (MISCELLANEOUS) ×3 IMPLANT
REAMER ROD DEEP FLUTE 2.5X950 (INSTRUMENTS) ×2 IMPLANT
SET HNDPC FAN SPRY TIP SCT (DISPOSABLE) IMPLANT
SPONGE LAP 18X18 RF (DISPOSABLE) IMPLANT
STAPLER VISISTAT 35W (STAPLE) IMPLANT
STRIP CLOSURE SKIN 1/2X4 (GAUZE/BANDAGES/DRESSINGS) ×1 IMPLANT
SUCTION FRAZIER HANDLE 12FR (TUBING) ×3
SUCTION TUBE FRAZIER 12FR DISP (TUBING) ×1 IMPLANT
SUT ETHIBOND NAB CT1 #1 30IN (SUTURE) ×6 IMPLANT
SUT STRATAFIX 0 PDS 27 VIOLET (SUTURE) ×3
SUT VIC AB 2-0 CT1 27 (SUTURE) ×9
SUT VIC AB 2-0 CT1 TAPERPNT 27 (SUTURE) ×3 IMPLANT
SUTURE STRATFX 0 PDS 27 VIOLET (SUTURE) ×1 IMPLANT
SWAB COLLECTION DEVICE MRSA (MISCELLANEOUS) IMPLANT
SWAB CULTURE ESWAB REG 1ML (MISCELLANEOUS) IMPLANT
SYR 50ML LL SCALE MARK (SYRINGE) ×3 IMPLANT
SYSTEM SOLUTION BOW LG RT 8IN (Hips) ×2 IMPLANT
TOWEL OR 17X26 10 PK STRL BLUE (TOWEL DISPOSABLE) ×6 IMPLANT
TRAY FOLEY MTR SLVR 16FR STAT (SET/KITS/TRAYS/PACK) ×3 IMPLANT
WATER STERILE IRR 1000ML POUR (IV SOLUTION) ×6 IMPLANT
YANKAUER SUCT BULB TIP 10FT TU (MISCELLANEOUS) ×3 IMPLANT

## 2019-11-15 NOTE — Interval H&P Note (Signed)
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  The various methods of treatment have been discussed with the patient and family. After consideration of risks, benefits and other options for treatment, the patient has consented to  Procedure(s) with comments: TOTAL HIP REVISION (Right) - 182min as a surgical intervention.  The patient's history has been reviewed, patient examined, no change in status, stable for surgery.  I have reviewed the patient's chart and labs.  Questions were answered to the patient's satisfaction.     Pilar Plate Dayvin Aber

## 2019-11-15 NOTE — Discharge Instructions (Addendum)
 Melinda Aluisio, MD Total Joint Specialist [facility] [address] [place], [place] [address] [phone]  TOTAL KNEE REPLACEMENT POSTOPERATIVE DIRECTIONS    Knee Rehabilitation, Guidelines Following Surgery  Results after knee surgery are often greatly improved when you follow the exercise, range of motion and muscle strengthening exercises prescribed by your doctor. Safety measures are also important to protect the knee from further injury. If any of these exercises cause you to have increased pain or swelling in your knee joint, decrease the amount until you are comfortable again and slowly increase them. If you have problems or questions, call your caregiver or physical therapist for advice.   BLOOD CLOT PREVENTION . Take a 325 mg Aspirin two times a day for three weeks following surgery. Then take an 81 mg Aspirin once a day for three weeks. Then discontinue Aspirin. . You may resume your vitamins/supplements upon discharge from the hospital. . Do not take any NSAIDs (Advil, Aleve, Ibuprofen, Meloxicam, etc.) until you have discontinued the 325 mg Aspirin.  HOME CARE INSTRUCTIONS  . Remove items at home which could result in a fall. This includes throw rugs or furniture in walking pathways.  . ICE to the affected knee as much as tolerated. Icing helps control swelling. If the swelling is well controlled you will be more comfortable and rehab easier. Continue to use ice on the knee for pain and swelling from surgery. You may notice swelling that will progress down to the foot and ankle. This is normal after surgery. Elevate the leg when you are not up walking on it.    . Continue to use the breathing machine which will help keep your temperature down. It is common for your temperature to cycle up and down following surgery, especially at night when you are not up moving around and exerting yourself. The breathing machine keeps your lungs expanded and your  temperature down. . Do not place pillow under the operative knee, focus on keeping the knee straight while resting  DIET You may resume your previous home diet once you are discharged from the hospital.  DRESSING / WOUND CARE / SHOWERING . Keep your bulky bandage on for 2 days. On the third post-operative day you may remove the Ace bandage and gauze. There is a waterproof adhesive bandage on your skin which will stay in place until your first follow-up appointment. Once you remove this you will not need to place another bandage . You may begin showering 3 days following surgery, but do not submerge the incision under water.  ACTIVITY For the first 5 days, the key is rest and control of pain and swelling . Do your home exercises twice a day starting on post-operative day 3. On the days you go to physical therapy, just do the home exercises once that day. . You should rest, ice and elevate the leg for 50 minutes out of every hour. Get up and walk/stretch for 10 minutes per hour. After 5 days you can increase your activity slowly as tolerated. . Walk with your walker as instructed. Use the walker until you are comfortable transitioning to a cane. Walk with the cane in the opposite hand of the operative leg. You may discontinue the cane once you are comfortable and walking steadily. . Avoid periods of inactivity such as sitting longer than an hour when not asleep. This helps prevent blood clots.  . You may discontinue the knee immobilizer once you are able to perform a straight   leg raise while lying down. . You may resume a sexual relationship in one month or when given the OK by your doctor.  . You may return to work once you are cleared by your doctor.  . Do not drive a car for 6 weeks or until released by your surgeon.  . Do not drive while taking narcotics.  TED HOSE STOCKINGS Wear the elastic stockings on both legs for three weeks following surgery during the day. You may remove them at night  for sleeping.  WEIGHT BEARING Weight bearing as tolerated with assist device (walker, cane, etc) as directed, use it as long as suggested by your surgeon or therapist, typically at least 4-6 weeks.  POSTOPERATIVE CONSTIPATION PROTOCOL Constipation - defined medically as fewer than three stools per week and severe constipation as less than one stool per week.  One of the most common issues patients have following surgery is constipation.  Even if you have a regular bowel pattern at home, your normal regimen is likely to be disrupted due to multiple reasons following surgery.  Combination of anesthesia, postoperative narcotics, change in appetite and fluid intake all can affect your bowels.  In order to avoid complications following surgery, here are some recommendations in order to help you during your recovery period.  . Colace (docusate) - Pick up an over-the-counter form of Colace or another stool softener and take twice a day as long as you are requiring postoperative pain medications.  Take with a full glass of water daily.  If you experience loose stools or diarrhea, hold the colace until you stool forms back up. If your symptoms do not get better within 1 week or if they get worse, check with your doctor. . Dulcolax (bisacodyl) - Pick up over-the-counter and take as directed by the product packaging as needed to assist with the movement of your bowels.  Take with a full glass of water.  Use this product as needed if not relieved by Colace only.  . MiraLax (polyethylene glycol) - Pick up over-the-counter to have on hand. MiraLax is a solution that will increase the amount of water in your bowels to assist with bowel movements.  Take as directed and can mix with a glass of water, juice, soda, coffee, or tea. Take if you go more than two days without a movement. Do not use MiraLax more than once per day. Call your doctor if you are still constipated or irregular after using this medication for 7 days  in a row.  If you continue to have problems with postoperative constipation, please contact the office for further assistance and recommendations.  If you experience "the worst abdominal pain ever" or develop nausea or vomiting, please contact the office immediatly for further recommendations for treatment.  ITCHING If you experience itching with your medications, try taking only a single pain pill, or even half a pain pill at a time.  You can also use Benadryl over the counter for itching or also to help with sleep.   MEDICATIONS See your medication summary on the "After Visit Summary" that the nursing staff will review with you prior to discharge.  You may have some home medications which will be placed on hold until you complete the course of blood thinner medication.  It is important for you to complete the blood thinner medication as prescribed by your surgeon.  Continue your approved medications as instructed at time of discharge.  PRECAUTIONS . If you experience chest pain or shortness of   breath - call 911 immediately for transfer to the hospital emergency department.  . If you develop a fever greater that 101 F, purulent drainage from wound, increased redness or drainage from wound, foul odor from the wound/dressing, or calf pain - CONTACT YOUR SURGEON.                                                   FOLLOW-UP APPOINTMENTS Make sure you keep all of your appointments after your operation with your surgeon and caregivers. You should call the office at the above phone number and make an appointment for approximately two weeks after the date of your surgery or on the date instructed by your surgeon outlined in the "After Visit Summary".  RANGE OF MOTION AND STRENGTHENING EXERCISES  Rehabilitation of the knee is important following a knee injury or an operation. After just a few days of immobilization, the muscles of the thigh which control the knee become weakened and shrink (atrophy). Knee  exercises are designed to build up the tone and strength of the thigh muscles and to improve knee motion. Often times heat used for twenty to thirty minutes before working out will loosen up your tissues and help with improving the range of motion but do not use heat for the first two weeks following surgery. These exercises can be done on a training (exercise) mat, on the floor, on a table or on a bed. Use what ever works the best and is most comfortable for you Knee exercises include:  . Leg Lifts - While your knee is still immobilized in a splint or cast, you can do straight leg raises. Lift the leg to 60 degrees, hold for 3 sec, and slowly lower the leg. Repeat 10-20 times 2-3 times daily. Perform this exercise against resistance later as your knee gets better.  . Quad and Hamstring Sets - Tighten up the muscle on the front of the thigh (Quad) and hold for 5-10 sec. Repeat this 10-20 times hourly. Hamstring sets are done by pushing the foot backward against an object and holding for 5-10 sec. Repeat as with quad sets.   Leg Slides: Lying on your back, slowly slide your foot toward your buttocks, bending your knee up off the floor (only go as far as is comfortable). Then slowly slide your foot back down until your leg is flat on the floor again.  Angel Wings: Lying on your back spread your legs to the side as far apart as you can without causing discomfort.  A rehabilitation program following serious knee injuries can speed recovery and prevent re-injury in the future due to weakened muscles. Contact your doctor or a physical therapist for more information on knee rehabilitation.   IF YOU ARE TRANSFERRED TO A SKILLED REHAB FACILITY If the patient is transferred to a skilled rehab facility following release from the hospital, a list of the current medications will be sent to the facility for the patient to continue.  When discharged from the skilled rehab facility, please have the facility set up the  patient's Home Health Physical Therapy prior to being released. Also, the skilled facility will be responsible for providing the patient with their medications at time of release from the facility to include their pain medication, the muscle relaxants, and their blood thinner medication. If the patient is still at the   rehab facility at time of the two week follow up appointment, the skilled rehab facility will also need to assist the patient in arranging follow up appointment in our office and any transportation needs.  MAKE SURE YOU:  . Understand these instructions.  . Get help right away if you are not doing well or get worse.   DENTAL ANTIBIOTICS:  In most cases prophylactic antibiotics for Dental procdeures after total joint surgery are not necessary.  Exceptions are as follows:  1. History of prior total joint infection  2. Severely immunocompromised (Organ Transplant, cancer chemotherapy, Rheumatoid biologic meds such as Humera)  3. Poorly controlled diabetes (A1C &gt; 8.0, blood glucose over 200)  If you have one of these conditions, contact your surgeon for an antibiotic prescription, prior to your dental procedure.    Pick up stool softner and laxative for home use following surgery while on pain medications. Do not submerge incision under water. Please use good hand washing techniques while changing dressing each day. May shower starting three days after surgery. Please use a clean towel to pat the incision dry following showers. Continue to use ice for pain and swelling after surgery. Do not use any lotions or creams on the incision until instructed by your surgeon.  

## 2019-11-15 NOTE — Transfer of Care (Signed)
Immediate Anesthesia Transfer of Care Note  Patient: Melinda Thompson  Procedure(s) Performed: TOTAL HIP REVISION (Right Hip)  Patient Location: PACU  Anesthesia Type:General  Level of Consciousness: sedated and patient cooperative  Airway & Oxygen Therapy: Patient Spontanous Breathing and Patient connected to face mask oxygen  Post-op Assessment: Report given to RN, Post -op Vital signs reviewed and stable and Patient moving all extremities X 4  Post vital signs: stable  Last Vitals:  Vitals Value Taken Time  BP 141/89 11/15/19 1834  Temp    Pulse 83 11/15/19 1842  Resp 17 11/15/19 1842  SpO2 99 % 11/15/19 1842  Vitals shown include unvalidated device data.  Last Pain:  Vitals:   11/15/19 1301  TempSrc: Oral      Patients Stated Pain Goal: 4 ([id number])  Complications: No apparent anesthesia complications

## 2019-11-15 NOTE — Plan of Care (Signed)
  Problem: Education: Goal: Knowledge of General Education information will improve Description: Including pain rating scale, medication(s)/side effects and non-pharmacologic comfort measures Outcome: Progressing   Problem: Pain Managment: Goal: General experience of comfort will improve Outcome: Progressing   

## 2019-11-15 NOTE — Anesthesia Procedure Notes (Signed)
Procedure Name: Intubation Date/Time: 11/15/2019 3:07 PM Performed by: Lissa Morales, CRNA Pre-anesthesia Checklist: Patient identified, Emergency Drugs available, Suction available and Patient being monitored Patient Re-evaluated:Patient Re-evaluated prior to induction Oxygen Delivery Method: Circle system utilized Preoxygenation: Pre-oxygenation with 100% oxygen Induction Type: IV induction Ventilation: Mask ventilation without difficulty Laryngoscope Size: Mac and 4 Grade View: Grade I Tube type: Oral Tube size: 7.5 mm Number of attempts: 1 Airway Equipment and Method: Stylet Placement Confirmation: ETT inserted through vocal cords under direct vision,  positive ETCO2 and breath sounds checked- equal and bilateral Secured at: 21 cm Tube secured with: Tape Dental Injury: Teeth and Oropharynx as per pre-operative assessment

## 2019-11-15 NOTE — Anesthesia Postprocedure Evaluation (Signed)
Anesthesia Post Note  Patient: Melinda Thompson  Procedure(s) Performed: TOTAL HIP REVISION (Right Hip)     Patient location during evaluation: PACU Anesthesia Type: General Level of consciousness: awake and alert Pain management: pain level controlled Vital Signs Assessment: post-procedure vital signs reviewed and stable Respiratory status: spontaneous breathing, nonlabored ventilation, respiratory function stable and patient connected to nasal cannula oxygen Cardiovascular status: blood pressure returned to baseline and stable Postop Assessment: no apparent nausea or vomiting Anesthetic complications: no    Last Vitals:  Vitals:   11/15/19 1941 11/15/19 1945  BP:  (!) 152/95  Pulse: 83 84  Resp: 11 10  Temp: (!) 36.4 C   SpO2: 95% 95%    Last Pain:  Vitals:   11/15/19 1955  TempSrc:   PainSc: [place]

## 2019-11-15 NOTE — Anesthesia Preprocedure Evaluation (Signed)
Anesthesia Evaluation  Patient identified by MRN, date of birth, ID band Patient awake    Reviewed: Allergy & Precautions, NPO status , Patient's Chart, lab work & pertinent test results  Airway Mallampati: II  TM Distance: >3 FB Neck ROM: Full    Dental no notable dental hx.    Pulmonary neg pulmonary ROS,    Pulmonary exam normal breath sounds clear to auscultation       Cardiovascular negative cardio ROS Normal cardiovascular exam Rhythm:Regular Rate:Normal     Neuro/Psych negative neurological ROS  negative psych ROS   GI/Hepatic Neg liver ROS, GERD  ,  Endo/Other  negative endocrine ROS  Renal/GU negative Renal ROS  negative genitourinary   Musculoskeletal negative musculoskeletal ROS (+)   Abdominal   Peds negative pediatric ROS (+)  Hematology negative hematology ROS (+)   Anesthesia Other Findings   Reproductive/Obstetrics negative OB ROS                             Anesthesia Physical Anesthesia Plan  ASA: II  Anesthesia Plan: General   Post-op Pain Management:    Induction: Intravenous  PONV Risk Score and Plan: 3 and Ondansetron and Dexamethasone  Airway Management Planned: Oral ETT  Additional Equipment:   Intra-op Plan:   Post-operative Plan: Extubation in OR  Informed Consent: I have reviewed the patients History and Physical, chart, labs and discussed the procedure including the risks, benefits and alternatives for the proposed anesthesia with the patient or authorized representative who has indicated his/her understanding and acceptance.     Dental advisory given  Plan Discussed with: CRNA and Surgeon  Anesthesia Plan Comments:         Anesthesia Quick Evaluation

## 2019-11-15 NOTE — Brief Op Note (Signed)
11/15/2019  6:08 PM  PATIENT:  Melinda Thompson  69 y.o. 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

## 2019-11-16 ENCOUNTER — Encounter: Payer: Self-pay | Admitting: *Deleted

## 2019-11-16 LAB — BASIC METABOLIC PANEL
Anion gap: 11 (ref 5–15)
BUN: 9 mg/dL (ref 8–23)
CO2: 23 mmol/L (ref 22–32)
Calcium: 8.4 mg/dL — ABNORMAL LOW (ref 8.9–10.3)
Chloride: 100 mmol/L (ref 98–111)
Creatinine, Ser: 0.7 mg/dL (ref 0.44–1.00)
GFR calc Af Amer: >60 mL/min (ref >60)
GFR calc non Af Amer: >60 mL/min (ref >60)
Glucose, Bld: 149 mg/dL — ABNORMAL HIGH (ref 70–99)
Potassium: 4.6 mmol/L (ref 3.5–5.1)
Sodium: 134 mmol/L — ABNORMAL LOW (ref 135–145)

## 2019-11-16 LAB — CBC
HCT: 39 % (ref 36.0–46.0)
Hemoglobin: 12.5 g/dL (ref 12.0–15.0)
MCH: 29.6 pg (ref 26.0–34.0)
MCHC: 32.1 g/dL (ref 30.0–36.0)
MCV: 92.2 fL (ref 80.0–100.0)
Platelets: 401 10*3/uL — ABNORMAL HIGH (ref 150–400)
RBC: 4.23 MIL/uL (ref 3.87–5.11)
RDW: 13.2 % (ref 11.5–15.5)
WBC: 14.1 10*3/uL — ABNORMAL HIGH (ref 4.0–10.5)
nRBC: 0 % (ref 0.0–0.2)

## 2019-11-16 LAB — BPAM RBC
Blood Product Expiration Date: 202106132359
Blood Product Expiration Date: 202106152359
ISSUE DATE / TIME: 202105171722
ISSUE DATE / TIME: 202105171722
Unit Type and Rh: 5100
Unit Type and Rh: 5100

## 2019-11-16 LAB — TYPE AND SCREEN
ABO/RH(D): O POS
Antibody Screen: NEGATIVE
Unit division: 0
Unit division: 0

## 2019-11-16 MED ORDER — FAMOTIDINE 20 MG PO TABS
10.0000 mg | ORAL_TABLET | Freq: Every day | ORAL | Status: DC
  Administered 2019-11-16 – 2019-11-19 (×4): 10 mg via ORAL
  Filled 2019-11-16 (×4): qty 1

## 2019-11-16 NOTE — Progress Notes (Signed)
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 S/P revision on 11/15/19.    PT Comments     The patient is mobilizing with improvment this visit. Requires assistance tto place right leg onto bed. Will practice steps and progress mobility in AM. Patient has 4  step s to enter. Son and friend present for thos session.   Follow Up Recommendations  Home health PT;Supervision/Assistance - 24 hour     Equipment Recommendations  None recommended by PT    Recommendations for Other Services OT consult     Precautions / Restrictions Precautions Precautions: Fall;Posterior Hip Precaution Booklet Issued: Yes (comment) Restrictions Weight Bearing Restrictions: Yes RLE Weight Bearing: Partial weight bearing RLE Partial Weight Bearing Percentage or Pounds: 50    Mobility  Bed Mobility Overal bed mobility: Needs Assistance Bed Mobility: Sit to Supine     Supine to sit: Mod assist;HOB elevated Sit to supine: Mod assist   General bed mobility comments: assist with right leg onto bed  Transfers Overall transfer level: Needs assistance Equipment used: Rolling walker (2 wheeled) Transfers: Sit to/from Stand Sit to Stand: Min assist Stand pivot transfers: Mod assist       General transfer comment: patient stood  from recliner with good posterior hip precautions. Able to take a few steps to back up to bed. Cues for hand and right leg position to sit down  Ambulation/Gait             General Gait Details: TBA   Stairs             Wheelchair Mobility    Modified Rankin (Stroke Patients Only)       Balance Overall balance assessment: Needs assistance Sitting-balance support: Feet supported;Bilateral upper extremity supported Sitting balance-Leahy Scale:  Fair Sitting balance - Comments: tends to lean backwards off right thigh                                    Cognition Arousal/Alertness: Awake/alert Behavior During Therapy: WFL for tasks assessed/performed Overall Cognitive Status: Within Functional Limits for tasks assessed                                        Exercises      General Comments        Pertinent Vitals/Pain Pain Assessment: Faces Pain Score: 6  Pain Location: right thigh, back of knee Pain Descriptors / Indicators: Aching;Discomfort;Grimacing;Guarding Pain Intervention(s): RN gave pain meds during session;Repositioned;Ice applied    Home Living                 Additional Comments: will bneed to be mod I dfor self care/ADL's    Prior Function        Comments: intermittently using AD prior to surg   PT Goals (current goals can now be found in the care plan section) Acute Rehab PT Goals Patient Stated Goal: to go home, back to work PT Goal Formulation: With patient Time For Goal Achievement: 11/23/19 Potential to Achieve Goals: Good Progress towards PT goals: Progressing toward goals    Frequency    7X/week  PT Plan Current plan remains appropriate    Co-evaluation              AM-PAC PT "6 Clicks" Mobility   Outcome Measure  Help needed turning from your back to your side while in a flat bed without using bedrails?: A Lot Help needed moving from lying on your back to sitting on the side of a flat bed without using bedrails?: A Lot Help needed moving to and from a bed to a chair (including a wheelchair)?: A Lot Help needed standing up from a chair using your arms (e.g., wheelchair or bedside chair)?: A Lot Help needed to walk in hospital room?: A Lot Help needed climbing 3-5 steps with a railing? : Total 6 Click Score: 11    End of Session Equipment Utilized During Treatment: Gait belt Activity Tolerance: Patient tolerated treatment  well Patient left: in bed;with call bell/phone within reach;with family/visitor present Nurse Communication: Mobility status PT Visit Diagnosis: Unsteadiness on feet (R26.81);Difficulty in walking, not elsewhere classified (R26.2);Pain Pain - Right/Left: Right Pain - part of body: Hip;Leg     Time: 1431-1500 PT Time Calculation (min) (ACUTE ONLY): 29 min  Charges:  $Therapeutic Activity: 23-37 mins                     Tresa Endo PT Acute Rehabilitation Services Pager [phone] Office [phone]    Claretha Cooper 11/16/2019, 3:38 PM

## 2019-11-16 NOTE — Progress Notes (Signed)
   Subjective: 1 Day Post-Op Procedure(s) (LRB): TOTAL HIP REVISION (Right) Patient reports pain as moderate.   Patient seen in rounds by Dr. Wynelle Link. Patient is well, and has had no acute complaints or problems other than pain in the right hip. No issues overnight. Denies chest pain or SOB. Voiding without difficulty. We will begin therapy today.   Objective: Vital signs in last 24 hours: Temp:  [97.5 F (36.4 C)-98.8 F (37.1 C)] 97.7 F (36.5 C) (05/18 0448) Pulse Rate:  [69-86] 69 (05/18 0448) Resp:  [10-21] 15 (05/18 0448) BP: (128-163)/(80-100) 138/94 (05/18 0448) SpO2:  [94 %-100 %] 97 % (05/18 0448) Weight:  [88.5 kg] 88.5 kg (05/17 1300)  Intake/Output from previous day:  Intake/Output Summary (Last 24 hours) at 11/16/2019 0712 Last data filed at 11/16/2019 0448 Gross per 24 hour  Intake 6610.09 ml  Output 2430 ml  Net 4180.09 ml     Intake/Output this shift: No intake/output data recorded.  Labs: Recent Labs    11/15/19 1647 11/16/19 0303  HGB 9.9* 12.5   Recent Labs    11/15/19 1647 11/16/19 0303  WBC  --  14.1*  RBC  --  4.23  HCT 29.0* 39.0  PLT  --  401*   Recent Labs    11/15/19 1647 11/16/19 0303  NA 138 134*  K 3.6 4.6  CL 100 100  CO2  --  23  BUN 6* 9  CREATININE 0.60 0.70  GLUCOSE 91 149*  CALCIUM  --  8.4*   No results for input(s): LABPT, INR in the last 72 hours.  Exam: General - Patient is Alert and Oriented Extremity - Neurologically intact Neurovascular intact Sensation intact distally Dorsiflexion/Plantar flexion intact Dressing - dressing C/D/I Motor Function - intact, moving foot and toes well on exam.   Past Medical History:  Diagnosis Date  . Anxiety   . Arthritis    osteoarthritis  . GERD (gastroesophageal reflux disease)   . Headache    migraines  . History of hiatal hernia     Assessment/Plan: 1 Day Post-Op Procedure(s) (LRB): TOTAL HIP REVISION (Right) Principal Problem:   Failed total hip  arthroplasty (HCC)  Estimated body mass index is 32.99 kg/m as calculated from the following:   Height as of this encounter: 5' 4.5" (1.638 m).   Weight as of this encounter: 88.5 kg. Advance diet Up with therapy  DVT Prophylaxis - Aspirin Partial weight bearing to the RLE (50%) D/C O2 and pulse ox and try on room air. Hemovac pulled without difficulty, will continue therapy.  Plan is to go Home after hospital stay. Plan for discharge tomorrow with [place], PA-C Orthopedic Surgery [phone] 11/16/2019, 7:12 AM

## 2019-11-16 NOTE — Op Note (Signed)
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  PROCEDURE:  Right total hip arthroplasty revision.  SURGEON:  Gaynelle Arabian, MD  ANESTHESIA:  General.  ESTIMATED BLOOD LOSS:  100.  DRAINS:  Hemovac x1.  COMPLICATIONS:  None.  CONDITION:  Stable to recovery.  BRIEF CLINICAL NOTE:  Melinda Thompson is a 69 year old female who had a right total hip arthroplasty done many years ago.  She had a metal-on-metal construct.  Earlier this year it was noted that she did have an elevated ion levels and also had a fluid collection  on her MRI.  She was planning on having a revision later this year, but unfortunately, had a major increase in pain last week and x-ray showed that her femoral stem had cracked distally.  She presents now for a right femoral versus total hip revision.  PROCEDURE IN DETAIL:  After successful administration of general anesthetic, the patient was placed in left lateral decubitus position with the right side up and held with a hip positioner.  Right lower extremity was isolated from her perineum with  plastic drape and prepped and draped in the usual sterile fashion.  A posterolateral incision was made with a 10 blade through subcutaneous tissue to the fascia lata, which was incised in line with the skin incision.  Sciatic nerve was palpated and  protected and the joint identified.  There was fluid in the joint, which was metal stained consistent with metalosis.  The hip was dislocated and the femoral head was removed.  She had an S-ROM stem in place.  I attempted to debond the stem from the  sleeve, but was unsuccessful.  I also attempted to  disrupt the interface between the sleeve and bone proximally, but the hip was not moving at all attempting to extract it.  I then went to the acetabular side, addressed the acetabulum.  Femur was retracted anteriorly to gain acetabular exposure.  The acetabular retractors were placed.  The metal liner was then removed from the acetabular shell.  The shell was in excellent position and is well fixed.  It is a 56 mm shell.  The acetabular  component was thoroughly irrigated with saline.  There is no metal debris present.  I placed a 36 mm neutral +4 marathon liner for the 56 cup.  We then addressed the femur again.  I felt that an osteotomy was going to be necessary in order to remove the stem and also remove the broken piece of the stem.  A femoral osteotomy was then performed in the subtrochanteric area coursing about 7 inches down to the tip of the stem.  First,  I elevated the vastus lateralis off the posterior intermuscular septum and retracted that anteriorly.  The femoral cortex identified and drill holes were placed posterior, anterior and transverse to serve as the outline for the osteotomy.  Oscillating  saw was then used to cut the bone and the osteotomy was performed.  The bone is retracted anteriorly.  There was still evidence of ingrowth onto the sleeve at the distal part of the sleeve.  I used osteotomes to disrupt that area and then I was able to  remove the stem and sleeve as one unit.  The broken portion  of the stem is identified distally and I had to use osteotomes to remove bone from the broken portion and then using vice grips and mallet the broken portion was removed.  We then placed a guide rod distally and the femoral canal and reamed over the guide rod up to 14 mm for placement of the 13.5 mm solution bowed stem.  I then placed 4 Zimmer cables around the femur and reduced the osteotomy back to anatomic position.   The cables were sequentially tightened and then once tightened, the  bolt was tightened and the cables cut.  We then placed the guide rod into the femoral canal proximally through the proximal femur.  The reaming was again performed at the 14 mm to make  sure the canal was prepared along its entire extent.  We then broached starting at a 10 and coursing up to 13.5 mm first with a small stature broach and then a large stature broach.  The trial bowed 8 inch stem, which was a 13.5 diameter.  The trial was  placed in about 20 degrees of anteversion.  A +8.5 head is needed to get a stable reduction and to restore leg lengths.  Hip was reduced and has outstanding stability throughout full range of motion.  Trials were removed.  A permanent solution 13.5  diameter stem, 8 inch bowed stem for right femur was impacted in about 20 degrees of anteversion.  It was fantastic fit.  The trial 36+8.5 head was placed.  The hip reduced with outstanding stability with full extension, full external rotation, 70  degrees flexion, 40 degrees adduction, 90 degrees internal rotation, then 90 degrees of flexion, and 70 degrees of internal rotation.  By placing the right leg on top of the left, the leg lengths were equal.  The hip was dislocated and the permanent  36+8.5 ceramic head was placed.  The hip was reduced with the same stability parameters.  The wound was copiously irrigated with saline solution.  X-rays AP and lateral were taken showing the stem is within the femur.  No evidence of any periprosthetic  fracture with excellent fit of the stem.  The fascia of the vastus lateralis was closed with running #1 Vicryl.  The fascia lata is then closed over a Hemovac drain with a running 0 Stratafix, subcu tissue was then infiltrated with 30 mL of 0.25%  Marcaine with epinephrine.  Deep subcu was closed with another running StrataFix, superficial subcu closed with interrupted 2-0 Vicryl, and subcuticular running 4-0 Monocryl.  Incisions cleaned and dried and a bulky sterile dressing applied.  She  was  then placed into a knee immobilizer, awakened and transported to recovery in stable condition.  Note that a surgical assistant was a medical necessity for this procedure to do it in a safe and expeditious manner.  Surgical assistance necessary for retraction of vital neurovascular structures and for proper positioning of the limb, for safe removal  of the old implant, and safe and accurate placement of the new implant.  CN/NUANCE  D:11/15/2019 T:11/16/2019 JOB:[id number]

## 2019-11-16 NOTE — Evaluation (Signed)
Physical Therapy Evaluation Patient Details Name: Melinda Thompson MRN: [id number] DOB: July 31, 1950 Today's Date: 11/16/2019   History of Present Illness  69 YO female with failed  right posterior THA from many years back, metalosis and distal stem broken. S/P revision on 11/15/19. H/O L THA  Clinical Impression  The patient The patient tolerated mobilizing to standing and taking a few steps to recliner. Reports right thigh is tight and painful. Patient tolerated well considering surgical procedure. Patient currently requires mod assistance. Per patient, son lives with her and has friends to assist.Pt admitted with above diagnosis.  Pt currently with functional limitations due to the deficits listed below (see PT Problem List). Pt will benefit from skilled PT to increase their independence and safety with mobility to allow discharge to the venue listed below.       Follow Up Recommendations Home health PT;Supervision/Assistance - 24 hour    Equipment Recommendations  None recommended by PT    Recommendations for Other Services OT consult     Precautions / Restrictions Precautions Precautions: Fall;Posterior Hip Precaution Booklet Issued: Yes (comment) Restrictions Weight Bearing Restrictions: Yes RLE Weight Bearing: Partial weight bearing RLE Partial Weight Bearing Percentage or Pounds: 50      Mobility  Bed Mobility Overal bed mobility: Needs Assistance Bed Mobility: Supine to Sit     Supine to sit: Mod assist;HOB elevated     General bed mobility comments: assist with right leg to bed edge( moving to the right), use of bed rail to support and slide to bed edge and sit upright  Transfers Overall transfer level: Needs assistance Equipment used: Rolling walker (2 wheeled) Transfers: Sit to/from [facility] Sit to Stand: Mod assist;From elevated surface Stand pivot transfers: Mod assist       General transfer comment: bed raised, cues for hand placement  to stand, cues for PWB on Rt. patient placed only TDWB on the right for small steps to recliner. Supported the right leg as  patient sat down to recliner.  Ambulation/Gait             General Gait Details: TBA  Stairs            Wheelchair Mobility    Modified Rankin (Stroke Patients Only)       Balance Overall balance assessment: Needs assistance Sitting-balance support: Feet supported;Bilateral upper extremity supported Sitting balance-Leahy Scale: Fair Sitting balance - Comments: tends to lean backwards off right thigh                                     Pertinent Vitals/Pain Pain Assessment: Faces Pain Score: 6  Pain Location: right thigh, back of knee Pain Descriptors / Indicators: Aching;Discomfort;Grimacing;Guarding Pain Intervention(s): Monitored during session;Limited activity within patient's tolerance;Premedicated before session;Patient requesting pain meds-RN notified;Ice applied    Home Living Family/patient expects to be discharged to:: Private residence Living Arrangements: Children Available Help at Discharge: Family Type of Home: House Home Access: Stairs to enter Entrance Stairs-Rails: Right Entrance Stairs-Number of Steps: 4 Home Layout: One level Home Equipment: Environmental consultant - 2 wheels;Bedside commode;Cane - single point;Crutches Additional Comments: will need to be mod I for self care/ADL's  Tub/shower with door, can take down door  Prior Function Level of Independence: Independent with assistive device(s)         Comments: intermittently using AD prior to surg     Hand Dominance   Dominant Hand:  Right    Extremity/Trunk Assessment   Upper Extremity Assessment Upper Extremity Assessment: Overall WFL for tasks assessed    Lower Extremity Assessment Lower Extremity Assessment: RLE deficits/detail RLE Deficits / Details: required assistance to move right leg to bed edge,       Communication   Communication: No  difficulties  Cognition Arousal/Alertness: Awake/alert Behavior During Therapy: WFL for tasks assessed/performed Overall Cognitive Status: Within Functional Limits for tasks assessed                                        General Comments      Exercises     Assessment/Plan    PT Assessment Patient needs continued PT services  PT Problem List Decreased strength;Decreased mobility;Decreased safety awareness;Decreased range of motion;Decreased knowledge of precautions;Decreased activity tolerance;Pain;Decreased knowledge of use of DME       PT Treatment Interventions DME instruction;Therapeutic activities;Gait training;Therapeutic exercise;Patient/family education;Stair training;Functional mobility training    PT Goals (Current goals can be found in the Care Plan section)  Acute Rehab PT Goals Patient Stated Goal: to go home, back to work PT Goal Formulation: With patient Time For Goal Achievement: 11/23/19 Potential to Achieve Goals: Good    Frequency 7X/week   Barriers to discharge Decreased caregiver support son lives with patient but will not be availble to assist with grooming and hygiene    Co-evaluation               AM-PAC PT "6 Clicks" Mobility  Outcome Measure Help needed turning from your back to your side while in a flat bed without using bedrails?: A Lot Help needed moving from lying on your back to sitting on the side of a flat bed without using bedrails?: A Lot Help needed moving to and from a bed to a chair (including a wheelchair)?: A Lot Help needed standing up from a chair using your arms (e.g., wheelchair or bedside chair)?: A Lot Help needed to walk in hospital room?: A Lot Help needed climbing 3-5 steps with a railing? : Total 6 Click Score: 11    End of Session Equipment Utilized During Treatment: Gait belt Activity Tolerance: Patient limited by pain Patient left: in chair;with call bell/phone within reach;with nursing/sitter  in room Nurse Communication: Mobility status PT Visit Diagnosis: Unsteadiness on feet (R26.81);Difficulty in walking, not elsewhere classified (R26.2);Pain Pain - Right/Left: Right Pain - part of body: Hip;Leg    Time: [id number] PT Time Calculation (min) (ACUTE ONLY): 36 min   Charges:   PT Evaluation $PT Eval Moderate Complexity: 1 Mod PT Treatments $Therapeutic Activity: 8-22 mins        Tresa Endo PT Acute Rehabilitation Services Pager [phone] Office [phone]   Claretha Cooper 11/16/2019, 1:20 PM

## 2019-11-17 LAB — BASIC METABOLIC PANEL
Anion gap: 8 (ref 5–15)
BUN: 11 mg/dL (ref 8–23)
CO2: 27 mmol/L (ref 22–32)
Calcium: 8.4 mg/dL — ABNORMAL LOW (ref 8.9–10.3)
Chloride: 103 mmol/L (ref 98–111)
Creatinine, Ser: 0.64 mg/dL (ref 0.44–1.00)
GFR calc Af Amer: >60 mL/min (ref >60)
GFR calc non Af Amer: >60 mL/min (ref >60)
Glucose, Bld: 165 mg/dL — ABNORMAL HIGH (ref 70–99)
Potassium: 3.9 mmol/L (ref 3.5–5.1)
Sodium: 138 mmol/L (ref 135–145)

## 2019-11-17 LAB — CBC
HCT: 35 % — ABNORMAL LOW (ref 36.0–46.0)
Hemoglobin: 11.1 g/dL — ABNORMAL LOW (ref 12.0–15.0)
MCH: 29.4 pg (ref 26.0–34.0)
MCHC: 31.7 g/dL (ref 30.0–36.0)
MCV: 92.8 fL (ref 80.0–100.0)
Platelets: 424 10*3/uL — ABNORMAL HIGH (ref 150–400)
RBC: 3.77 MIL/uL — ABNORMAL LOW (ref 3.87–5.11)
RDW: 13.3 % (ref 11.5–15.5)
WBC: 15.2 10*3/uL — ABNORMAL HIGH (ref 4.0–10.5)
nRBC: 0 % (ref 0.0–0.2)

## 2019-11-17 NOTE — Progress Notes (Signed)
Physical Therapy Treatment Patient Details Name: Melinda Thompson MRN: [id number] DOB: July 09, 1950 Today's Date: 11/17/2019    History of Present Illness 69 YO female with failed  right posterior THA from many years back, metalosis and distal stem broken. S/P revision on 11/15/19.    PT Comments    The patient is progressing well, most difficulty is getting right leg onto bed. Patient has an option of sleeping inrecliner if necessary at first. Will practice steps in AM.   Follow Up Recommendations  Home health PT;Supervision/Assistance - 24 hour     Equipment Recommendations  None recommended by PT    Recommendations for Other Services       Precautions / Restrictions Precautions Precautions: Fall;Posterior Hip Precaution Booklet Issued: Yes (comment) Restrictions Weight Bearing Restrictions: Yes RLE Weight Bearing: Partial weight bearing RLE Partial Weight Bearing Percentage or Pounds: 50    Mobility  Bed Mobility Overal bed mobility: Needs Assistance Bed Mobility: Sit to Supine     Supine to sit: Min assist Sit to supine: Mod assist   General bed mobility comments: attempted use of belt on foot, patient did need mod assistance to  lift right leg back onto bed, sort of fell backward which really stretched the thigh.  Transfers Overall transfer level: Needs assistance Equipment used: Rolling walker (2 wheeled) Transfers: Sit to/from Stand Sit to Stand: Min guard         General transfer comment: from recliner and BSC  Ambulation/Gait Ambulation/Gait assistance: Min guard Gait Distance (Feet): 30 Feet(x 2) Assistive device: Rolling walker (2 wheeled) Gait Pattern/deviations: Step-to pattern Gait velocity: decr   General Gait Details: maintained PWB on right   Stairs             Wheelchair Mobility    Modified Rankin (Stroke Patients Only)       Balance Overall balance assessment: Needs assistance Sitting-balance support: Feet  supported;Bilateral upper extremity supported Sitting balance-Leahy Scale: Good Sitting balance - Comments: Off loadin of right thigh towards the left.                                    Cognition Arousal/Alertness: Awake/alert                                            Exercises Total Joint Exercises Heel Slides: AAROM;Right;10 reps;Supine(used sheet)    General Comments        Pertinent Vitals/Pain Faces Pain Scale: Hurts little more Pain Location: right thigh, especially getting leg back onto bed Pain Descriptors / Indicators: Aching;Discomfort;Grimacing;Guarding Pain Intervention(s): Monitored during session;Premedicated before session;Ice applied;Repositioned    Home Living                      Prior Function            PT Goals (current goals can now be found in the care plan section) Progress towards PT goals: Progressing toward goals    Frequency    7X/week      PT Plan Current plan remains appropriate    Co-evaluation              AM-PAC PT "6 Clicks" Mobility   Outcome Measure  Help needed turning from your back to your side while in a flat bed without using  bedrails?: A Little Help needed moving from lying on your back to sitting on the side of a flat bed without using bedrails?: A Little Help needed moving to and from a bed to a chair (including a wheelchair)?: A Little Help needed standing up from a chair using your arms (e.g., wheelchair or bedside chair)?: A Little Help needed to walk in hospital room?: A Little Help needed climbing 3-5 steps with a railing? : A Lot 6 Click Score: 17    End of Session Equipment Utilized During Treatment: Gait belt Activity Tolerance: Patient tolerated treatment well Patient left: in bed;with call bell/phone within reach;with bed alarm set Nurse Communication: Mobility status PT Visit Diagnosis: Unsteadiness on feet (R26.81);Difficulty in walking, not elsewhere  classified (R26.2);Pain Pain - Right/Left: Right Pain - part of body: Hip;Leg     Time: 1536-1610 PT Time Calculation (min) (ACUTE ONLY): 34 min  Charges:  $Gait Training: 8-22 mins $Self Care/Home Management: [place] Pager [phone] Office [phone]    Claretha Cooper 11/17/2019, 4:13 PM

## 2019-11-17 NOTE — TOC Transition Note (Signed)
Transition of Care [facility]) - CM/SW Discharge Note   Patient Details  Name: Melinda Thompson MRN: [id number] Date of Birth: 08-08-1950  [facility] CM/SW Contact:  Lennart Pall, LCSW Phone Number: 11/17/2019, 11:48 AM   Clinical Narrative:  Met with pt to review d/c arrangements.  Difficult to find [facility] in network with her insurance.  [facility] is able to accept, however, may not be able to provide 1st visit until the weekend.  Have made PA and pt aware.  No further needs.     Final next level of care: Home w Home Health Services Barriers to Discharge: Barriers Resolved   Patient Goals and CMS Choice Patient states their goals for this hospitalization and ongoing recovery are:: hopes to go home by tomorrow      Discharge Placement                       Discharge Plan and Services                DME Arranged: N/A(has needed DME from prior surgeries) DME Agency: NA       HH Arranged: PT HH Agency: [facility] Date [place]: 11/17/19 Time [place]: 8550 Representative spoke with at [place]: [place] Determinants of Health ([place]) Interventions     Readmission Risk Interventions No flowsheet data found.

## 2019-11-17 NOTE — Plan of Care (Signed)
Plan of care reviewed and discussed with the patient. 

## 2019-11-17 NOTE — Evaluation (Signed)
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 S/P revision on 11/15/19.   Clinical Impression   Ms. Deette Dulworth is a 69 year old woman s/p revision of right THA on 5/17. On evaluation patient presents with decreased ROM and strength of RLE, complaints of pain, posterior hip precautions and decreased activity tolerance resulting in decreased independence with mobility and ADLs. Patient is familiar with hip precautions and use of AE from prior surgeries but still hesitant and questioning during evaluation. Patient min assist for transfer into sitting, min guard for transfers with RW and min guard for toileting and requiring assistance for lower body ADLs. Patient will benefit from skilled OT services to improve deficits and return to modified independence.     Follow Up Recommendations  Home health OT    Equipment Recommendations  None recommended by OT(Patient reports having necessary DME.)    Recommendations for Other Services       Precautions / Restrictions Precautions Precautions: Fall;Posterior Hip Restrictions Weight Bearing Restrictions: Yes RLE Weight Bearing: Partial weight bearing RLE Partial Weight Bearing Percentage or Pounds: 50      Mobility Bed Mobility Overal bed mobility: Needs Assistance Bed Mobility: Supine to Sit     Supine to sit: Min assist Sit to supine: Min guard   General bed mobility comments: assist with RLE to transfer to side of bed. verbal cues for standing for hip precautions.  Transfers       Sit to Stand: Min guard Stand pivot transfers: Min guard       General transfer comment: Min guard to ambulate to bathroom and perform toilet transfer. Min guard to stand at sink to wash hands and ambulate back to room and transfer into  recliner.    Balance     Sitting balance-Leahy Scale: Good Sitting balance - Comments: Off loadin of right thigh towards the left.     Standing balance-Leahy Scale: Fair                             ADL either performed or assessed with clinical judgement   ADL Overall ADL's : Needs assistance/impaired Eating/Feeding: Independent   Grooming: Wash/dry face;Oral care Grooming Details (indicate cue type and reason): Patient washed face, brushed teeth and hair seated in recliner with set up from therapist. After toileting, patient stood at sink to wash hands. Upper Body Bathing: Set up   Lower Body Bathing: Moderate assistance   Upper Body Dressing : Set up   Lower Body Dressing: Maximal assistance Lower Body Dressing Details (indicate cue type and reason): Assistance to donn socks. Demonstrates ability to pull up pants from knee level. Needs assistance to get clothing over feet. Toilet Transfer: Magazine features editor Details (indicate cue type and reason): Patient performed toilet transfer with cga from therapist. verbal cues for technique. Toileting- Water quality scientist and Hygiene: Min guard Toileting - Clothing Manipulation Details (indicate cue type and reason): Patient able to manage hygiene and clothing with min guard and therapist providing verbal cue for compensatory technique. Tub/ Shower Transfer: Nurse, learning disability Details (indicate cue type and reason): Patient demonstrates ability to perform transfer here in hospital but home environment is complicated by metal lip on shower and shower doors. Home environment would need to  be replicated. Discussed sponge bathing until tub could be altered and use of shower chair. Functional mobility during ADLs: Min guard;Rolling walker General ADL Comments: Therapist provided detailed instruction in regards to 90 degree rule (posterior) hip precaution and instruction with movement with ADLs and sit to stand.      Vision   Vision Assessment?: No apparent visual deficits     Perception     Praxis      Pertinent Vitals/Pain Pain Assessment: Faces Faces Pain Scale: Hurts little more Pain Descriptors / Indicators: Aching;Discomfort;Grimacing;Guarding Pain Intervention(s): Premedicated before session     Hand Dominance Right   Extremity/Trunk Assessment Upper Extremity Assessment Upper Extremity Assessment: Overall WFL for tasks assessed   Lower Extremity Assessment Lower Extremity Assessment: Defer to PT evaluation       Communication Communication Communication: No difficulties   Cognition Arousal/Alertness: Awake/alert Behavior During Therapy: WFL for tasks assessed/performed Overall Cognitive Status: Within Functional Limits for tasks assessed                                     General Comments       Exercises     Shoulder Instructions      Home Living Family/patient expects to be discharged to:: Private residence Living Arrangements: Children Available Help at Discharge: Family Type of Home: House Home Access: Stairs to enter Technical brewer of Steps: 4 Entrance Stairs-Rails: Right Home Layout: One level     Bathroom Shower/Tub: Teacher, early years/pre: Standard     Home Equipment: Environmental consultant - 2 wheels;Bedside commode;Cane - single point;Crutches   Additional Comments: Reports ordering and having AE from prior hip surgeries.      Prior Functioning/Environment Level of Independence: Independent with assistive device(s)        Comments: intermiottently using AD prior to surg        OT Problem List: Decreased activity tolerance;Pain;Decreased strength;Decreased range of motion;Decreased knowledge of use of DME or AE;Decreased knowledge of precautions      OT Treatment/Interventions: Self-care/ADL training;DME and/or AE instruction;Therapeutic activities;Patient/family education    OT Goals(Current goals can be found  in the care plan section) Acute Rehab OT Goals Patient Stated Goal: to be able to perform shower transfer Time For Goal Achievement: 12/01/19 Potential to Achieve Goals: Good  OT Frequency: Min 2X/week   Barriers to D/C:            Co-evaluation              AM-PAC OT "6 Clicks" Daily Activity     Outcome Measure Help from another person eating meals?: None Help from another person taking care of personal grooming?: A Little Help from another person toileting, which includes using toliet, bedpan, or urinal?: A Little Help from another person bathing (including washing, rinsing, drying)?: A Lot Help from another person to put on and taking off regular upper body clothing?: A Little Help from another person to put on and taking off regular lower body clothing?: A Lot 6 Click Score: 17   End of Session Equipment Utilized During Treatment: Gait belt;Rolling walker  Activity Tolerance: Patient tolerated treatment well Patient left: in chair;with chair alarm set  OT Visit Diagnosis: Other abnormalities of gait and mobility (R26.89);Pain Pain - Right/Left: Right Pain - part of body: Hip                Time: [id number] OT Time Calculation (  min): 36 min Charges:  OT General Charges $OT Visit: 1 Visit OT Evaluation $OT Eval Low Complexity: 1 Low OT Treatments $Self Care/Home Management : 8-22 mins  Derl Barrow, OTR/L Acute Care Rehab Services  Office [phone]   Lenward Chancellor 11/17/2019, 12:02 PM

## 2019-11-17 NOTE — Progress Notes (Addendum)
   Subjective: 2 Days Post-Op Procedure(s) (LRB): TOTAL HIP REVISION (Right) Patient reports pain as mild.   Patient seen in rounds for Dr. Wynelle Link. Patient is well, and has had no acute complaints or problems other than discomfort in the right hip and thigh. She reports that the leg feels heavy and she has difficulty lifting it. No acute events overnight. Denies CP, SHOB, N/V.  We will continue therapy today.   Objective: Vital signs in last 24 hours: Temp:  [97.7 F (36.5 C)-98.5 F (36.9 C)] 97.8 F (36.6 C) (05/19 0458) Pulse Rate:  [67-82] 67 (05/19 0458) Resp:  [14-18] 16 (05/19 0458) BP: (109-127)/(61-92) 124/68 (05/19 0458) SpO2:  [94 %-98 %] 95 % (05/19 0458)  Intake/Output from previous day:  Intake/Output Summary (Last 24 hours) at 11/17/2019 0813 Last data filed at 11/17/2019 0730 Gross per 24 hour  Intake 1260.19 ml  Output 2275 ml  Net -1014.81 ml     Intake/Output this shift: Total I/O In: -  Out: 500 [Urine:500]  Labs: Recent Labs    11/15/19 1647 11/16/19 0303 11/17/19 0253  HGB 9.9* 12.5 11.1*   Recent Labs    11/16/19 0303 11/17/19 0253  WBC 14.1* 15.2*  RBC 4.23 3.77*  HCT 39.0 35.0*  PLT 401* 424*   Recent Labs    11/16/19 0303 11/17/19 0253  NA 134* 138  K 4.6 3.9  CL 100 103  CO2 23 27  BUN 9 11  CREATININE 0.70 0.64  GLUCOSE 149* 165*  CALCIUM 8.4* 8.4*   No results for input(s): LABPT, INR in the last 72 hours.  Exam: General - Patient is Alert and Oriented Extremity - Neurologically intact Sensation intact distally Intact pulses distally Dorsiflexion/Plantar flexion intact Dressing - dressing C/D/I Motor Function - intact, moving foot and toes well on exam.   Past Medical History:  Diagnosis Date  . Anxiety   . Arthritis    osteoarthritis  . GERD (gastroesophageal reflux disease)   . Headache    migraines  . History of hiatal hernia     Assessment/Plan: 2 Days Post-Op Procedure(s) (LRB): TOTAL HIP  REVISION (Right) Principal Problem:   Failed total hip arthroplasty (HCC)  Estimated body mass index is 32.99 kg/m as calculated from the following:   Height as of this encounter: 5' 4.5" (1.638 m).   Weight as of this encounter: 88.5 kg. Advance diet Up with therapy  DVT Prophylaxis - Aspirin Partial weight bearing RLE 50% Begin therapy Hip precautions discussed with patient  Hemoglobin stable at 11.1 this morning. Plan is to go Home after hospital stay. Plan for possible discharge today vs tomorrow depending on progression with PT. We will reassess her after PT this afternoon, but she may require one additional day of therapy in order to progress to safe discharge home.    Griffith Citron, PA-C Orthopedic Surgery [phone] 11/17/2019, 8:13 AM

## 2019-11-17 NOTE — Progress Notes (Signed)
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 S/P revision on 11/15/19.    PT Comments    The patient is progressing with mobil;ity, ,aintaining PWB on RLE. Patient will benefit from further PT to ensure safety to perform self care and safe ambulation at Dc.  Follow Up Recommendations  Home health PT;Supervision/Assistance - 24 hour     Equipment Recommendations  None recommended by PT    Recommendations for Other Services       Precautions / Restrictions Precautions Precautions: Fall;Posterior Hip Precaution Booklet Issued: Yes (comment) Restrictions Weight Bearing Restrictions: Yes RLE Weight Bearing: Partial weight bearing RLE Partial Weight Bearing Percentage or Pounds: 50    Mobility  Bed Mobility Overal bed mobility: Needs Assistance Bed Mobility: Supine to Sit     Supine to sit: Min assist Sit to supine: Min guard   General bed mobility comments: assist with RLE to transfer to side of bed. verbal cues for standing for hip precautions.  Transfers Overall transfer level: Needs assistance Equipment used: Rolling walker (2 wheeled) Transfers: Sit to/from Stand Sit to Stand: Min guard Stand pivot transfers: Min guard       General transfer comment: Min guard to astand from bed and recliner, extra time  Ambulation/Gait Ambulation/Gait assistance: Min assist;Min guard Gait Distance (Feet): 30 Feet Assistive device: Rolling walker (2 wheeled) Gait Pattern/deviations: Step-to pattern     General Gait Details: maintained PWB on right   Stairs             Wheelchair Mobility    Modified Rankin (Stroke Patients Only)       Balance Overall balance assessment: Needs assistance Sitting-balance support: Feet supported;Bilateral upper  extremity supported Sitting balance-Leahy Scale: Good Sitting balance - Comments: Off loadin of right thigh towards the left.     Standing balance-Leahy Scale: Fair                              Cognition Arousal/Alertness: Awake/alert Behavior During Therapy: WFL for tasks assessed/performed Overall Cognitive Status: Within Functional Limits for tasks assessed                                        Exercises      General Comments        Pertinent Vitals/Pain Pain Assessment: Faces Faces Pain Scale: Hurts little more Pain Location: right thigh, back of knee Pain Descriptors / Indicators: Aching;Discomfort;Grimacing;Guarding Pain Intervention(s): Monitored during session;Premedicated before session    Home Living Family/patient expects to be discharged to:: Private residence Living Arrangements: Children Available Help at Discharge: Family Type of Home: House Home Access: Stairs to enter Entrance Stairs-Rails: Right Home Layout: One level Home Equipment: Environmental consultant - 2 wheels;Bedside commode;Cane - single point;Crutches Additional Comments: Reports ordering and having AE from prior hip surgeries.    Prior Function Level of Independence: Independent with assistive device(s)      Comments: intermiottently using AD prior to surg   PT Goals (current goals can now be found in the care plan section) Acute Rehab PT Goals Patient Stated Goal: to be able to perform shower transfer Progress towards PT goals: Progressing toward goals  Frequency    7X/week      PT Plan Current plan remains appropriate    Co-evaluation              AM-PAC PT "6 Clicks" Mobility   Outcome Measure  Help needed turning from your back to your side while in a flat bed without using bedrails?: A Little Help needed moving from lying on your back to sitting on the side of a flat bed without using bedrails?: A Little Help needed moving to and from a bed to a  chair (including a wheelchair)?: A Little Help needed standing up from a chair using your arms (e.g., wheelchair or bedside chair)?: A Little Help needed to walk in hospital room?: A Little Help needed climbing 3-5 steps with a railing? : A Lot 6 Click Score: 17    End of Session Equipment Utilized During Treatment: Gait belt Activity Tolerance: Patient tolerated treatment well Patient left: (in BR with OT) Nurse Communication: Mobility status PT Visit Diagnosis: Unsteadiness on feet (R26.81);Difficulty in walking, not elsewhere classified (R26.2);Pain Pain - Right/Left: Right Pain - part of body: Hip;Leg     Time: 1010-1020 PT Time Calculation (min) (ACUTE ONLY): 10 min  Charges:  $Gait Training: 8-22 mins                     [place] Pager [phone] Office [phone]    Claretha Cooper 11/17/2019, 2:09 PM

## 2019-11-18 LAB — CBC
HCT: 47 % — ABNORMAL HIGH (ref 36.0–46.0)
Hemoglobin: 14.8 g/dL (ref 12.0–15.0)
MCH: 29.2 pg (ref 26.0–34.0)
MCHC: 31.5 g/dL (ref 30.0–36.0)
MCV: 92.7 fL (ref 80.0–100.0)
Platelets: 251 10*3/uL (ref 150–400)
RBC: 5.07 MIL/uL (ref 3.87–5.11)
RDW: 13.7 % (ref 11.5–15.5)
WBC: 9.3 10*3/uL (ref 4.0–10.5)
nRBC: 0 % (ref 0.0–0.2)

## 2019-11-18 MED ORDER — HYDROCODONE-ACETAMINOPHEN 5-325 MG PO TABS: 1.0000 | ORAL_TABLET | Freq: Four times a day (QID) | ORAL | 0 refills | Status: DC | PRN

## 2019-11-18 MED ORDER — ASPIRIN 325 MG PO TBEC: 325.0000 mg | DELAYED_RELEASE_TABLET | Freq: Two times a day (BID) | ORAL | 0 refills | Status: AC

## 2019-11-18 MED ORDER — HYDROCODONE-ACETAMINOPHEN 7.5-325 MG PO TABS: 1.0000 | ORAL_TABLET | ORAL | 0 refills | Status: DC | PRN

## 2019-11-18 MED ORDER — METHOCARBAMOL 500 MG PO TABS: 500.0000 mg | ORAL_TABLET | Freq: Four times a day (QID) | ORAL | 0 refills | Status: DC | PRN

## 2019-11-18 NOTE — Progress Notes (Signed)
   Subjective: 3 Days Post-Op Procedure(s) (LRB): TOTAL HIP REVISION (Right) Patient reports pain as mild to moderate.   Patient seen in rounds with Dr. Wynelle Link. Patient is well, and has had no acute complaints or problems other than pain in the right thigh. Denies chest pain, SOB, or calf pain. No issues overnight. Voiding without difficulty. Positive flatus.  Plan is to go Home after hospital stay.  Objective: Vital signs in last 24 hours: Temp:  [97.6 F (36.4 C)-98.7 F (37.1 C)] 97.6 F (36.4 C) (05/20 [id number]) Pulse Rate:  [67-79] 79 (05/20 0613) Resp:  [15-18] 15 (05/20 0613) BP: (114-121)/(63-71) 116/71 (05/20 0613) SpO2:  [97 %-99 %] 98 % (05/20 0613)  Intake/Output from previous day:  Intake/Output Summary (Last 24 hours) at 11/18/2019 0729 Last data filed at 11/18/2019 [id number] Gross per 24 hour  Intake 480 ml  Output 800 ml  Net -320 ml    Intake/Output this shift: No intake/output data recorded.  Labs: Recent Labs    11/15/19 1647 11/16/19 0303 11/17/19 0253 11/18/19 0336  HGB 9.9* 12.5 11.1* 14.8   Recent Labs    11/17/19 0253 11/18/19 0336  WBC 15.2* 9.3  RBC 3.77* 5.07  HCT 35.0* 47.0*  PLT 424* 251   Recent Labs    11/16/19 0303 11/17/19 0253  NA 134* 138  K 4.6 3.9  CL 100 103  CO2 23 27  BUN 9 11  CREATININE 0.70 0.64  GLUCOSE 149* 165*  CALCIUM 8.4* 8.4*   No results for input(s): LABPT, INR in the last 72 hours.  Exam: General - Patient is Alert and Oriented Extremity - Neurologically intact Neurovascular intact Sensation intact distally Dorsiflexion/Plantar flexion intact Dressing/Incision - clean, dry, no drainage Motor Function - intact, moving foot and toes well on exam.   Past Medical History:  Diagnosis Date  . Anxiety   . Arthritis    osteoarthritis  . GERD (gastroesophageal reflux disease)   . Headache    migraines  . History of hiatal hernia     Assessment/Plan: 3 Days Post-Op Procedure(s) (LRB): TOTAL HIP  REVISION (Right) Principal Problem:   Failed total hip arthroplasty (HCC)  Estimated body mass index is 32.99 kg/m as calculated from the following:   Height as of this encounter: 5' 4.5" (1.638 m).   Weight as of this encounter: 88.5 kg. Up with therapy  DVT Prophylaxis - Aspirin Partial weight bearing to the RLE (50%)  Possible discharge later today if she is meeting goals with therapy and pain controlled. Otherwise will discharge tomorrow. HHPT scheduled to begin on Saturday.  Theresa Duty, PA-C Orthopedic Surgery [phone] 11/18/2019, 7:29 AM

## 2019-11-18 NOTE — Plan of Care (Signed)
Plan of care reviewed and discussed with the patient. 

## 2019-11-18 NOTE — Care Management Important Message (Signed)
Important Message  Patient Details IM Letter given to Maribel Case Manager to present to the Patient Name: Melinda Thompson MRN: [id number] Date of Birth: 03-Jun-1951   Medicare Important Message Given:  Yes     Kerin Salen 11/18/2019, 11:18 AM

## 2019-11-18 NOTE — Progress Notes (Signed)
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 S/P revision on 11/15/19.    PT Comments    The patient is progressing, still with difficulty in bed mobility. Practiced steps today. Plans for Dc ? Today vs. Tomorrow per ortho note.   Follow Up Recommendations  Home health PT;Supervision/Assistance - 24 hour     Equipment Recommendations  None recommended by PT    Recommendations for Other Services       Precautions / Restrictions Precautions Precautions: Fall;Posterior Hip Restrictions Weight Bearing Restrictions: Yes RLE Weight Bearing: Partial weight bearing RLE Partial Weight Bearing Percentage or Pounds: 50    Mobility  Bed Mobility         Supine to sit: Min assist     General bed mobility comments: a little assist with right leg to bed edge  Transfers Overall transfer level: Needs assistance Equipment used: Rolling walker (2 wheeled) Transfers: Sit to/from Stand Sit to Stand: Min guard Stand pivot transfers: Min guard       General transfer comment: from recliner and BSC  Ambulation/Gait Ambulation/Gait assistance: Min guard Gait Distance (Feet): 40 Feet Assistive device: Rolling walker (2 wheeled) Gait Pattern/deviations: Step-to pattern Gait velocity: decr   General Gait Details: maintained PWB on right   Stairs Stairs: Yes Stairs assistance: Min assist Stair Management: One rail Right;Forwards;With crutches Number of Stairs: 2 General stair comments: practiced with 1 crutch and rail, assist to ensure right foot was over the edge when descending.   Wheelchair Mobility    Modified Rankin (Stroke Patients Only)       Balance                                            Cognition                                              Exercises      General Comments        Pertinent Vitals/Pain Faces Pain Scale: Hurts little more Pain Location: right thigh, especially getting leg back onto bed Pain Descriptors / Indicators: Aching;Discomfort;Grimacing;Guarding Pain Intervention(s): Premedicated before session;Monitored during session    Home Living                      Prior Function            PT Goals (current goals can now be found in the care plan section) Progress towards PT goals: Progressing toward goals    Frequency    7X/week      PT Plan Current plan remains appropriate    Co-evaluation              AM-PAC PT "6 Clicks" Mobility   Outcome Measure  Help needed turning from your back to your side while in a flat bed without using bedrails?: A Little Help needed moving from lying on your back to sitting on the side of a flat bed without using bedrails?: A Little Help needed moving to and from a bed to a chair (including a wheelchair)?:  A Little Help needed standing up from a chair using your arms (e.g., wheelchair or bedside chair)?: A Little Help needed to walk in hospital room?: A Little Help needed climbing 3-5 steps with a railing? : A Lot 6 Click Score: 17    End of Session Equipment Utilized During Treatment: Gait belt Activity Tolerance: Patient tolerated treatment well Patient left: with call bell/phone within reach;in chair Nurse Communication: Mobility status PT Visit Diagnosis: Unsteadiness on feet (R26.81);Difficulty in walking, not elsewhere classified (R26.2);Pain Pain - Right/Left: Right Pain - part of body: Hip;Leg     Time: [id number] PT Time Calculation (min) (ACUTE ONLY): 27 min  Charges:  $Gait Training: 23-37 mins                     [place] Pager [phone] Office [phone]    Melinda Thompson 11/18/2019, 1:12 PM

## 2019-11-18 NOTE — Plan of Care (Signed)
Care plan reviewed.

## 2019-11-18 NOTE — Progress Notes (Signed)
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 S/P revision on 11/15/19.    PT Comments    Patient reports felling nausea with transitions. Patient requires mod assist to get the right leg onto bed. Attempted use of leg lifter, patient unable to power up the hip flexors and quads to lift leg against gravuilty. Patient may have to sleep in recliner initially. Continue PT.  Follow Up Recommendations  Home health PT;Supervision/Assistance - 24 hour     Equipment Recommendations  None recommended by PT    Recommendations for Other Services       Precautions / Restrictions Precautions Precautions: Fall;Posterior Hip Restrictions RLE Weight Bearing: Partial weight bearing RLE Partial Weight Bearing Percentage or Pounds: 50    Mobility  Bed Mobility         Supine to sit: Min assist Sit to supine: Mod assist   General bed mobility comments: mod assist with right leg onto bed, Attempted use of leg lifter but patient could not perform  Transfers Overall transfer level: Needs assistance Equipment used: Rolling walker (2 wheeled) Transfers: Sit to/from Stand Sit to Stand: Min guard Stand pivot transfers: Min guard       General transfer comment: from recliner and BSC  Ambulation/Gait Ambulation/Gait assistance: Min guard Gait Distance (Feet): 30 Feet(x 2) Assistive device: Rolling walker (2 wheeled) Gait Pattern/deviations: Step-to pattern Gait velocity: decr   General Gait Details: maintained PWB on right   Stairs Stairs: Yes Stairs assistance: Min assist Stair Management: One rail Right;Forwards;With crutches Number of Stairs: 2 General stair comments: practiced with 1 crutch and rail, assist to ensure right foot was over the edge when  descending.   Wheelchair Mobility    Modified Rankin (Stroke Patients Only)       Balance   Sitting-balance support: Feet supported;Bilateral upper extremity supported Sitting balance-Leahy Scale: Good     Standing balance support: During functional activity;Single extremity supported Standing balance-Leahy Scale: Fair Standing balance comment: while pulling up panties, requires close guard                            Cognition                                       General Comments: somewhat down, feels queasy and expresses being worried about going home      Exercises Total Joint Exercises Ankle Circles/Pumps: AROM;Right;10 reps Quad Sets: AROM;10 reps;Supine Heel Slides: AAROM;Right;10 reps;Supine    General Comments        Pertinent Vitals/Pain Pain Score: 6  Faces Pain Scale: Hurts little more Pain Location: right thigh, especially getting leg back onto bed Pain Descriptors / Indicators: Aching;Discomfort;Grimacing;Guarding Pain Intervention(s): Monitored during session;Premedicated before session;Ice applied    Home Living                      Prior Function            PT Goals (current goals can now be found in the care plan section) Progress towards PT goals: Progressing toward goals    Frequency    7X/week      PT Plan Current plan  remains appropriate    Co-evaluation              AM-PAC PT "6 Clicks" Mobility   Outcome Measure  Help needed turning from your back to your side while in a flat bed without using bedrails?: A Little Help needed moving from lying on your back to sitting on the side of a flat bed without using bedrails?: A Lot Help needed moving to and from a bed to a chair (including a wheelchair)?: A Little Help needed standing up from a chair using your arms (e.g., wheelchair or bedside chair)?: A Little Help needed to walk in hospital room?: A Little Help needed climbing 3-5 steps  with a railing? : A Lot 6 Click Score: 16    End of Session Equipment Utilized During Treatment: Gait belt Activity Tolerance: Patient limited by fatigue(nausea) Patient left: with call bell/phone within reach;with bed alarm set;in bed Nurse Communication: Mobility status(did not pass PT goals) PT Visit Diagnosis: Unsteadiness on feet (R26.81);Difficulty in walking, not elsewhere classified (R26.2);Pain Pain - Right/Left: Right Pain - part of body: Hip;Leg     Time: 1325-1350 PT Time Calculation (min) (ACUTE ONLY): 25 min  Charges:  $Gait Training: 8-22 mins $Self Care/Home Management: [place] Pager [phone] Office [phone]    Claretha Cooper 11/18/2019, 1:57 PM

## 2019-11-19 NOTE — Progress Notes (Signed)
   Subjective: 4 Days Post-Op Procedure(s) (LRB): TOTAL HIP REVISION (Right) Patient reports pain as mild.   Patient seen in rounds for Dr. Wynelle Link. Patient is well, and has had no acute complaints or problems other than discomfort in the right hip. No acute events overnight. Denies CP, SHOB, or vomiting. She does have mild nausea when she has pain. She states she feels ready for discharge today.  We will continue therapy today.   Objective: Vital signs in last 24 hours: Temp:  [97.6 F (36.4 C)-98.1 F (36.7 C)] 97.6 F (36.4 C) (05/21 0452) Pulse Rate:  [81-85] 85 (05/21 0452) Resp:  [15-18] 17 (05/21 0452) BP: (114-128)/(69-81) 114/70 (05/21 0452) SpO2:  [95 %-98 %] 98 % (05/21 0452)  Intake/Output from previous day:  Intake/Output Summary (Last 24 hours) at 11/19/2019 0818 Last data filed at 11/19/2019 0452 Gross per 24 hour  Intake 120 ml  Output 0 ml  Net 120 ml     Intake/Output this shift: No intake/output data recorded.  Labs: Recent Labs    11/17/19 0253 11/18/19 0336  HGB 11.1* 14.8   Recent Labs    11/17/19 0253 11/18/19 0336  WBC 15.2* 9.3  RBC 3.77* 5.07  HCT 35.0* 47.0*  PLT 424* 251   Recent Labs    11/17/19 0253  NA 138  K 3.9  CL 103  CO2 27  BUN 11  CREATININE 0.64  GLUCOSE 165*  CALCIUM 8.4*   No results for input(s): LABPT, INR in the last 72 hours.  Exam: General - Patient is Alert and Oriented Extremity - Neurologically intact Sensation intact distally Intact pulses distally Dorsiflexion/Plantar flexion intact Dressing - dressing C/D/I Motor Function - intact, moving foot and toes well on exam.   Past Medical History:  Diagnosis Date  . Anxiety   . Arthritis    osteoarthritis  . GERD (gastroesophageal reflux disease)   . Headache    migraines  . History of hiatal hernia     Assessment/Plan: 4 Days Post-Op Procedure(s) (LRB): TOTAL HIP REVISION (Right) Principal Problem:   Failed total hip arthroplasty  (HCC)  Estimated body mass index is 32.99 kg/m as calculated from the following:   Height as of this encounter: 5' 4.5" (1.638 m).   Weight as of this encounter: 88.5 kg. Advance diet Up with therapy D/C IV fluids  DVT Prophylaxis - Aspirin PWB to the RLE 50% Hip precautions discussed with patient  Plan is to go Home after hospital stay. Plan for discharge today following 1-2 sessions of therapy as long as she is meeting her goals. Continue PWB until follow up. Follow up in the office in 2 weeks.   Griffith Citron, PA-C Orthopedic Surgery [phone] 11/19/2019, 8:18 AM

## 2019-11-19 NOTE — Progress Notes (Signed)
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 S/P revision on 11/15/19.    PT Comments    Pt ambulated in hallway and practiced safe stair technique again.  Pt reviewed HEP handout with therapist and told to wait to perform standing exercises with HHPT for safety.  Pt reports understanding.  Pt has posterior hip precautions, HEP and stair handouts for pt and family to utilize upon d/c.  Pt to d/c home today.    Follow Up Recommendations  Home health PT;Supervision/Assistance - 24 hour     Equipment Recommendations  None recommended by PT    Recommendations for Other Services       Precautions / Restrictions Precautions Precautions: Fall;Posterior Hip Precaution Comments: pt only able to recall 1/3 posterior hip precautions so reviewed Restrictions Weight Bearing Restrictions: Yes RLE Weight Bearing: Partial weight bearing RLE Partial Weight Bearing Percentage or Pounds: 50    Mobility  Bed Mobility               General bed mobility comments: pt up in recliner  Transfers Overall transfer level: Needs assistance Equipment used: Rolling walker (2 wheeled) Transfers: Sit to/from Stand Sit to Stand: Min guard         General transfer comment: min/guard for safety, cues for posterior hip precautions  Ambulation/Gait Ambulation/Gait assistance: Min guard Gait Distance (Feet): 80 Feet Assistive device: Rolling walker (2 wheeled) Gait Pattern/deviations: Step-to pattern;Decreased stance time - right;Antalgic     General Gait Details: pt able to maintain PWB on R LE   Stairs Stairs: Yes Stairs assistance: Min guard Stair Management: Forwards;One rail Right;With crutches Number of Stairs: 2 General stair comments: pt unable to recall sequence from  yesterday so cues for safety and sequence, also provided handout   Wheelchair Mobility    Modified Rankin (Stroke Patients Only)       Balance                                            Cognition Arousal/Alertness: Awake/alert Behavior During Therapy: WFL for tasks assessed/performed Overall Cognitive Status: Within Functional Limits for tasks assessed                                        Exercises      General Comments        Pertinent Vitals/Pain Pain Assessment: 0-10 Pain Score: 3  Pain Location: right thigh Pain Descriptors / Indicators: Sore;Aching Pain Intervention(s): Repositioned;Monitored during session    Home Living                      Prior Function            PT Goals (current goals can now be found in the care plan section) Progress towards PT goals: Progressing toward goals    Frequency    7X/week      PT Plan Current plan remains appropriate    Co-evaluation              AM-PAC PT "6 Clicks" Mobility   Outcome Measure  Help needed  turning from your back to your side while in a flat bed without using bedrails?: A Little Help needed moving from lying on your back to sitting on the side of a flat bed without using bedrails?: A Little Help needed moving to and from a bed to a chair (including a wheelchair)?: A Little Help needed standing up from a chair using your arms (e.g., wheelchair or bedside chair)?: A Little Help needed to walk in hospital room?: A Little Help needed climbing 3-5 steps with a railing? : A Little 6 Click Score: 18    End of Session Equipment Utilized During Treatment: Gait belt Activity Tolerance: Patient tolerated treatment well Patient left: in chair;with call bell/phone within reach   PT Visit Diagnosis: Difficulty in walking, not elsewhere classified (R26.2)     Time: 1037-1100 PT Time Calculation (min) (ACUTE ONLY): 23 min  Charges:  $Gait  Training: 8-22 mins                    Arlyce Dice, DPT Acute Rehabilitation Services Office: [place] 11/19/2019, 12:40 PM

## 2019-11-23 DIAGNOSIS — K449 Diaphragmatic hernia without obstruction or gangrene: Secondary | ICD-10-CM | POA: Diagnosis not present

## 2019-11-23 DIAGNOSIS — G43909 Migraine, unspecified, not intractable, without status migrainosus: Secondary | ICD-10-CM | POA: Diagnosis not present

## 2019-11-23 DIAGNOSIS — Z7982 Long term (current) use of aspirin: Secondary | ICD-10-CM | POA: Diagnosis not present

## 2019-11-23 DIAGNOSIS — R69 Illness, unspecified: Secondary | ICD-10-CM | POA: Diagnosis not present

## 2019-11-23 DIAGNOSIS — K219 Gastro-esophageal reflux disease without esophagitis: Secondary | ICD-10-CM | POA: Diagnosis not present

## 2019-11-23 DIAGNOSIS — K589 Irritable bowel syndrome without diarrhea: Secondary | ICD-10-CM | POA: Diagnosis not present

## 2019-11-23 DIAGNOSIS — T84090D Other mechanical complication of internal right hip prosthesis, subsequent encounter: Secondary | ICD-10-CM | POA: Diagnosis not present

## 2019-11-23 DIAGNOSIS — M1990 Unspecified osteoarthritis, unspecified site: Secondary | ICD-10-CM | POA: Diagnosis not present

## 2019-11-23 DIAGNOSIS — Z9181 History of falling: Secondary | ICD-10-CM | POA: Diagnosis not present

## 2019-11-23 NOTE — Discharge Summary (Signed)
Physician Discharge Summary   Patient ID: MAYDELLE KINDLER MRN: [id number] DOB/AGE: 68/08/1950 68 y.o.  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  Admission Diagnoses:  Past Medical History:  Diagnosis Date  . Anxiety   . Arthritis    osteoarthritis  . GERD (gastroesophageal reflux disease)   . Headache    migraines  . History of hiatal hernia    Discharge Diagnoses:   Principal Problem:   Failed total hip arthroplasty ([place])  Estimated body mass index is 32.99 kg/m as calculated from the following:   Height as of this encounter: 5' 4.5" (1.638 m).   Weight as of this encounter: 88.5 kg.  Procedure:  Procedure(s) (LRB): TOTAL HIP REVISION (Right)   Consults: None  HPI: Evalin is a 69 year old female who had a right total hip arthroplasty done many years ago.  She had a metal-on-metal construct.  Earlier this year it was noted that she did have an elevated ion levels and also had a fluid collection  on her MRI.  She was planning on having a revision later this year, but unfortunately, had a major increase in pain last week and x-ray showed that her femoral stem had cracked distally.  She presents now for a right femoral versus total hip revision.  Laboratory Data: Admission on 11/15/2019, Discharged on 11/19/2019  Component Date Value Ref Range Status  . Order Confirmation 11/15/2019    Final                   Value:ORDER PROCESSED BY BLOOD BANK Performed at [facility], [place] [address], [place], [place] [address]   . Sodium 11/15/2019 138  135 - 145 mmol/L Final  . Potassium 11/15/2019 3.6  3.5 - 5.1 mmol/L Final  . Chloride 11/15/2019 100  98 - 111 mmol/L Final  . BUN 11/15/2019 6* 8 - 23 mg/dL Final  . Creatinine, Ser 11/15/2019 0.60  0.44 - 1.00 mg/dL Final  . Glucose, Bld 11/15/2019 91  70 - 99 mg/dL Final   Glucose reference range applies only to samples  taken after fasting for at least 8 hours.  . Calcium, Ion 11/15/2019 1.24  1.15 - 1.40 mmol/L Final  . TCO2 11/15/2019 26  22 - 32 mmol/L Final  . Hemoglobin 11/15/2019 9.9* 12.0 - 15.0 g/dL Final  . HCT 11/15/2019 29.0* 36.0 - 46.0 % Final  . WBC 11/16/2019 14.1* 4.0 - 10.5 K/uL Final  . RBC 11/16/2019 4.23  3.87 - 5.11 MIL/uL Final  . Hemoglobin 11/16/2019 12.5  12.0 - 15.0 g/dL Final  . HCT 11/16/2019 39.0  36.0 - 46.0 % Final  . MCV 11/16/2019 92.2  80.0 - 100.0 fL Final  . MCH 11/16/2019 29.6  26.0 - 34.0 pg Final  . MCHC 11/16/2019 32.1  30.0 - 36.0 g/dL Final  . RDW 11/16/2019 13.2  11.5 - 15.5 % Final  . Platelets 11/16/2019 401* 150 - 400 K/uL Final  . nRBC 11/16/2019 0.0  0.0 - 0.2 % Final   Performed at [facility], [place] [address] [place], [place] [address]  . Sodium 11/16/2019 134* 135 - 145 mmol/L Final  . Potassium 11/16/2019 4.6  3.5 - 5.1 mmol/L Final   DELTA CHECK NOTED  . Chloride 11/16/2019 100  98 - 111 mmol/L Final  . CO2 11/16/2019 23  22 - 32 mmol/L Final  . Glucose, Bld 11/16/2019 149* 70 - 99  mg/dL Final   Glucose reference range applies only to samples taken after fasting for at least 8 hours.  . BUN 11/16/2019 9  8 - 23 mg/dL Final  . Creatinine, Ser 11/16/2019 0.70  0.44 - 1.00 mg/dL Final  . Calcium 11/16/2019 8.4* 8.9 - 10.3 mg/dL Final  . GFR calc non Af Amer 11/16/2019 >60  >60 mL/min Final  . GFR calc Af Amer 11/16/2019 >60  >60 mL/min Final  . Anion gap 11/16/2019 11  5 - 15 Final   Performed at [facility], [place] [address] [place], [place] [address]  . WBC 11/17/2019 15.2* 4.0 - 10.5 K/uL Final  . RBC 11/17/2019 3.77* 3.87 - 5.11 MIL/uL Final  . Hemoglobin 11/17/2019 11.1* 12.0 - 15.0 g/dL Final  . HCT 11/17/2019 35.0* 36.0 - 46.0 % Final  . MCV 11/17/2019 92.8  80.0 - 100.0 fL Final  . MCH 11/17/2019 29.4  26.0 - 34.0 pg Final  . MCHC 11/17/2019 31.7  30.0 - 36.0 g/dL Final  . RDW 11/17/2019 13.3  11.5 -  15.5 % Final  . Platelets 11/17/2019 424* 150 - 400 K/uL Final  . nRBC 11/17/2019 0.0  0.0 - 0.2 % Final   Performed at [facility], [place] [address] [place], [place] [address]  . Sodium 11/17/2019 138  135 - 145 mmol/L Final  . Potassium 11/17/2019 3.9  3.5 - 5.1 mmol/L Final  . Chloride 11/17/2019 103  98 - 111 mmol/L Final  . CO2 11/17/2019 27  22 - 32 mmol/L Final  . Glucose, Bld 11/17/2019 165* 70 - 99 mg/dL Final   Glucose reference range applies only to samples taken after fasting for at least 8 hours.  . BUN 11/17/2019 11  8 - 23 mg/dL Final  . Creatinine, Ser 11/17/2019 0.64  0.44 - 1.00 mg/dL Final  . Calcium 11/17/2019 8.4* 8.9 - 10.3 mg/dL Final  . GFR calc non Af Amer 11/17/2019 >60  >60 mL/min Final  . GFR calc Af Amer 11/17/2019 >60  >60 mL/min Final  . Anion gap 11/17/2019 8  5 - 15 Final   Performed at [facility], [place] [address], [place], [place] [address]  . WBC 11/18/2019 9.3  4.0 - 10.5 K/uL Final  . RBC 11/18/2019 5.07  3.87 - 5.11 MIL/uL Final  . Hemoglobin 11/18/2019 14.8  12.0 - 15.0 g/dL Final   Comment: POST TRANSFUSION SPECIMEN DLTA   . HCT 11/18/2019 47.0* 36.0 - 46.0 % Final  . MCV 11/18/2019 92.7  80.0 - 100.0 fL Final  . MCH 11/18/2019 29.2  26.0 - 34.0 pg Final  . MCHC 11/18/2019 31.5  30.0 - 36.0 g/dL Final  . RDW 11/18/2019 13.7  11.5 - 15.5 % Final  . Platelets 11/18/2019 251  150 - 400 K/uL Final  . nRBC 11/18/2019 0.0  0.0 - 0.2 % Final   Performed at [facility], [place] [address] [place], [place] [address]  Hospital Outpatient Visit on 11/11/2019  Component Date Value Ref Range Status  . aPTT 11/11/2019 32  24 - 36 seconds Final   Performed at [facility], [place] [address], [place], [place] [address]  . WBC 11/11/2019 9.0  4.0 - 10.5 K/uL Final  . RBC 11/11/2019 4.23  3.87 - 5.11 MIL/uL Final  . Hemoglobin 11/11/2019 12.6  12.0 - 15.0 g/dL Final  . HCT 11/11/2019  39.3  36.0 - 46.0 % Final  . MCV 11/11/2019  92.9  80.0 - 100.0 fL Final  . MCH 11/11/2019 29.8  26.0 - 34.0 pg Final  . MCHC 11/11/2019 32.1  30.0 - 36.0 g/dL Final  . RDW 11/11/2019 13.2  11.5 - 15.5 % Final  . Platelets 11/11/2019 382  150 - 400 K/uL Final  . nRBC 11/11/2019 0.0  0.0 - 0.2 % Final   Performed at [facility], [place] [address] [place], [place] [address]  . Sodium 11/11/2019 141  135 - 145 mmol/L Final  . Potassium 11/11/2019 3.7  3.5 - 5.1 mmol/L Final  . Chloride 11/11/2019 102  98 - 111 mmol/L Final  . CO2 11/11/2019 28  22 - 32 mmol/L Final  . Glucose, Bld 11/11/2019 99  70 - 99 mg/dL Final   Glucose reference range applies only to samples taken after fasting for at least 8 hours.  . BUN 11/11/2019 13  8 - 23 mg/dL Final  . Creatinine, Ser 11/11/2019 0.80  0.44 - 1.00 mg/dL Final  . Calcium 11/11/2019 9.7  8.9 - 10.3 mg/dL Final  . Total Protein 11/11/2019 7.9  6.5 - 8.1 g/dL Final  . Albumin 11/11/2019 3.4* 3.5 - 5.0 g/dL Final  . AST 11/11/2019 16  15 - 41 U/L Final  . ALT 11/11/2019 18  0 - 44 U/L Final  . Alkaline Phosphatase 11/11/2019 119  38 - 126 U/L Final  . Total Bilirubin 11/11/2019 0.6  0.3 - 1.2 mg/dL Final  . GFR calc non Af Amer 11/11/2019 >60  >60 mL/min Final  . GFR calc Af Amer 11/11/2019 >60  >60 mL/min Final  . Anion gap 11/11/2019 11  5 - 15 Final   Performed at [facility], [place] [address] [place], [place] [address]  . Prothrombin Time 11/11/2019 14.3  11.4 - 15.2 seconds Final  . INR 11/11/2019 1.2  0.8 - 1.2 Final   Comment: (NOTE) INR goal varies based on device and disease states. Performed at [facility], [place] [address] [place], [place] [address]   . ABO/RH(D) 11/11/2019 O POS   Final  . Antibody Screen 11/11/2019 NEG   Final  . Sample Expiration 11/11/2019 11/18/2019,2359   Final  . Extend sample reason 11/11/2019 NO TRANSFUSIONS OR PREGNANCY IN THE PAST 3 MONTHS   Final    . Unit Number 11/11/2019 [id number]   Final  . Blood Component Type 11/11/2019 RED CELLS,LR   Final  . Unit division 11/11/2019 00   Final  . Status of Unit 11/11/2019 ISSUED,FINAL   Final  . Transfusion Status 11/11/2019 OK TO TRANSFUSE   Final  . Crossmatch Result 11/11/2019 Compatible   Final  . Unit Number 11/11/2019 [id number]   Final  . Blood Component Type 11/11/2019 RED CELLS,LR   Final  . Unit division 11/11/2019 00   Final  . Status of Unit 11/11/2019 ISSUED,FINAL   Final  . Transfusion Status 11/11/2019 OK TO TRANSFUSE   Final  . Crossmatch Result 11/11/2019    Final                   Value:Compatible Performed at [facility], [place] [address] [place], [place] [address]   . MRSA, PCR 11/11/2019 NEGATIVE  NEGATIVE Final  . Staphylococcus aureus 11/11/2019 NEGATIVE  NEGATIVE Final   Comment: (NOTE) The Xpert SA Assay (FDA approved for NASAL specimens in patients 42 years of age and older), is one component of a comprehensive surveillance program. It is not  intended to diagnose infection nor to guide or monitor treatment. Performed at [facility], [place] [address] [place], [place] [address]   . ISSUE DATE / TIME 11/11/2019 [id number]   Final  . Blood Product Unit Number 11/11/2019 [id number]   Final  . PRODUCT CODE 11/11/2019 [id number]   Final  . Unit Type and Rh 11/11/2019 5100   Final  . Blood Product Expiration Date 11/11/2019 [id number]   Final  . ISSUE DATE / TIME 11/11/2019 [id number]   Final  . Blood Product Unit Number 11/11/2019 [id number]   Final  . PRODUCT CODE 11/11/2019 [id number]   Final  . Unit Type and Rh 11/11/2019 5100   Final  . Blood Product Expiration Date 11/11/2019 [id number]   Final  Hospital Outpatient Visit on 11/11/2019  Component Date Value Ref Range Status  . SARS Coronavirus 2 11/11/2019 NEGATIVE  NEGATIVE Final   Comment: (NOTE) SARS-CoV-2 target nucleic acids are NOT DETECTED. The  SARS-CoV-2 RNA is generally detectable in upper and lower respiratory specimens during the acute phase of infection. Negative results do not preclude SARS-CoV-2 infection, do not rule out co-infections with other pathogens, and should not be used as the sole basis for treatment or other patient management decisions. Negative results must be combined with clinical observations, patient history, and epidemiological information. The expected result is Negative. Fact Sheet for Patients: SugarRoll.be Fact Sheet for Healthcare Providers: https://www.woods-mathews.com/ This test is not yet approved or cleared by the [place] FDA and  has been authorized for detection and/or diagnosis of SARS-CoV-2 by FDA under an Emergency Use Authorization (EUA). This EUA will remain  in effect (meaning this test can be used) for the duration of the COVID-19 declaration under Section 56                          4(b)(1) of the Act, 21 U.S.C. section 360bbb-3(b)(1), unless the authorization is terminated or revoked sooner. Performed at [facility], [place] [address], [place], [place] [address]      X-Rays:DG Pelvis Portable  Result Date: 11/15/2019 CLINICAL DATA:  Status post total right hip revision. EXAM: PORTABLE PELVIS 1-2 VIEWS COMPARISON:  Preoperative radiograph 11/19/2012 FINDINGS: Revision right hip arthroplasty in expected alignment. Cerclage wire fixation of the femoral stem. There is a fracture through the intertrochanteric femur laterally adjacent to the proximal stone, of uncertain acuity. There is a surgical drain in place. Left hip arthroplasty is intact were visualized. Pubic rami are intact. IMPRESSION: 1. Revision right hip arthroplasty in expected alignment. 2. Right intertrochanteric femur fracture adjacent to the lateral femoral stem, of uncertain acuity. Electronically Signed   By: Keith Rake M.D.   On: 11/15/2019 19:21   DG FEMUR  PORT, MIN 2 VIEWS RIGHT  Result Date: 11/15/2019 CLINICAL DATA:  Total hip revision, intraop. EXAM: RIGHT FEMUR PORTABLE 2 VIEW COMPARISON:  None. FINDINGS: Frontal and lateral views of the distal femoral shaft, reportedly intraop. Distal femoral stem with cerclage wire fixation. No evidence of periprosthetic fracture. Proximal aspect of the prosthesis is not included. Surgical drain is in place. IMPRESSION: Intraoperative imaging with intact distal femoral stem. Electronically Signed   By: Keith Rake M.D.   On: 11/15/2019 19:13    EKG:No orders found for this or any previous visit.   Hospital Course: TRIXIE CLYBURN is a 69 y.o. who was admitted to [facility]. They were brought to the operating room on 11/15/2019 and underwent  Procedure(s): TOTAL HIP REVISION.  Patient tolerated the procedure well and was later transferred to the recovery room and then to the orthopaedic floor for postoperative care. They were given PO and IV analgesics for pain control following their surgery. They were given 24 hours of postoperative antibiotics of  Anti-infectives (From admission, onward)   Start     Dose/Rate Route Frequency Ordered Stop   11/16/19 0600  ceFAZolin (ANCEF) IVPB 2g/100 mL premix     2 g 200 mL/hr over 30 Minutes Intravenous On call to O.R. 11/15/19 1237 11/15/19 1501   11/15/19 2200  ceFAZolin (ANCEF) IVPB 2g/100 mL premix     2 g 200 mL/hr over 30 Minutes Intravenous Every 6 hours 11/15/19 2011 11/16/19 0429     and started on DVT prophylaxis in the form of Aspirin.   PT and OT were ordered for total joint protocol. Discharge planning consulted to help with postop disposition and equipment needs. Patient had a good night on the evening of surgery. They started to get up OOB with therapy on POD #1. Hemovac drain was pulled without difficulty on day one. Continued to work with therapy into POD #2. She worked with therapy on POD #3 and was progressing, but required additional  therapy sessions prior to discharging. Pt was seen during rounds on POD #4 and was ready to go home pending progress with therapy. Dressing was clean and intact. Pt worked with therapy for one additional session and was meeting their goals. She was discharged to home later that day in stable condition.  Diet: Regular diet Activity: PWB Follow-up: in 2 weeks Disposition: Home Discharged Condition: good   Discharge Instructions    Call MD / Call 911   Complete by: As directed    If you experience chest pain or shortness of breath, CALL 911 and be transported to the hospital emergency room.  If you develope a fever above 101 F, pus (white drainage) or increased drainage or redness at the wound, or calf pain, call your surgeon's office.   Change dressing   Complete by: As directed    You have an adhesive waterproof bandage over the incision. Leave this in place until your first follow-up appointment. Once you remove this you will not need to place another bandage.   Constipation Prevention   Complete by: As directed    Drink plenty of fluids.  Prune juice may be helpful.  You may use a stool softener, such as Colace (over the counter) 100 mg twice a day.  Use MiraLax (over the counter) for constipation as needed.   Diet - low sodium heart healthy   Complete by: As directed    Do not sit on low chairs, stoools or toilet seats, as it may be difficult to get up from low surfaces   Complete by: As directed    Driving restrictions   Complete by: As directed    No driving for two weeks   Follow the hip precautions as taught in Physical Therapy   Complete by: As directed    Partial weight bearing   Complete by: As directed    % Body Weight: 50%   TED hose   Complete by: As directed    Use stockings (TED hose) for three weeks on both leg(s).  You may remove them at night for sleeping.     Allergies as of 11/19/2019      Reactions   Sulfonamide Derivatives Itching, Other (See Comments)  Medication List    STOP taking these medications   doxycycline 100 MG capsule Commonly known as: VIBRAMYCIN   HYDROcodone-acetaminophen 5-325 MG tablet Commonly known as: NORCO/VICODIN Replaced by: HYDROcodone-acetaminophen 7.5-325 MG tablet   naproxen sodium 220 MG tablet Commonly known as: ALEVE   predniSONE 20 MG tablet Commonly known as: DELTASONE     TAKE these medications   acetaminophen 500 MG tablet Commonly known as: TYLENOL Take 1,000 mg by mouth every 6 (six) hours as needed for moderate pain.   aspirin 325 MG EC tablet Take 1 tablet (325 mg total) by mouth 2 (two) times daily for 18 days. Then take one 81 mg aspirin once a day for three weeks. Then discontinue aspirin.   cyclobenzaprine 10 MG tablet Commonly known as: FLEXERIL Take 10 mg by mouth 2 (two) times daily.   HYDROcodone-acetaminophen 7.5-325 MG tablet Commonly known as: NORCO Take 1-2 tablets by mouth every 4 (four) hours as needed for severe pain (pain score 7-10). Replaces: HYDROcodone-acetaminophen 5-325 MG tablet   ketoconazole 2 % cream Commonly known as: NIZORAL Apply once daily for two weeks.   methocarbamol 500 MG tablet Commonly known as: ROBAXIN Take 1 tablet (500 mg total) by mouth every 6 (six) hours as needed for muscle spasms.   multivitamin-iron-minerals-folic acid chewable tablet Chew 1 tablet by mouth every morning.   sertraline 25 MG tablet Commonly known as: ZOLOFT Take 12.5 mg by mouth at bedtime.   SUMAtriptan 50 MG tablet Commonly known as: IMITREX Take 50 mg by mouth every 2 (two) hours as needed for migraine.            Discharge Care Instructions  (From admission, onward)         Start     Ordered   11/18/19 0000  Change dressing    Comments: You have an adhesive waterproof bandage over the incision. Leave this in place until your first follow-up appointment. Once you remove this you will not need to place another bandage.   11/18/19 0735   11/18/19  0000  Partial weight bearing    Question:  % Body Weight  Answer:  50%   11/18/19 [id number]         Follow-up Information    Gaynelle Arabian, MD. Schedule an appointment as soon as possible for a visit on 11/30/2019.   Specialty: Orthopedic Surgery Contact information: [address] [place] [address] [id number]        [facility] Follow up.   Specialty: [place] Why: will provide your home health physical therapy Contact information: [address] [place] [address] [id number]           Signed: Griffith Citron, PA-C Orthopedic Surgery 11/23/2019, 7:04 AM

## 2019-11-26 DIAGNOSIS — K449 Diaphragmatic hernia without obstruction or gangrene: Secondary | ICD-10-CM | POA: Diagnosis not present

## 2019-11-26 DIAGNOSIS — M1990 Unspecified osteoarthritis, unspecified site: Secondary | ICD-10-CM | POA: Diagnosis not present

## 2019-11-26 DIAGNOSIS — T84090D Other mechanical complication of internal right hip prosthesis, subsequent encounter: Secondary | ICD-10-CM | POA: Diagnosis not present

## 2019-11-26 DIAGNOSIS — Z7982 Long term (current) use of aspirin: Secondary | ICD-10-CM | POA: Diagnosis not present

## 2019-11-26 DIAGNOSIS — K589 Irritable bowel syndrome without diarrhea: Secondary | ICD-10-CM | POA: Diagnosis not present

## 2019-11-26 DIAGNOSIS — R69 Illness, unspecified: Secondary | ICD-10-CM | POA: Diagnosis not present

## 2019-11-26 DIAGNOSIS — Z9181 History of falling: Secondary | ICD-10-CM | POA: Diagnosis not present

## 2019-11-26 DIAGNOSIS — G43909 Migraine, unspecified, not intractable, without status migrainosus: Secondary | ICD-10-CM | POA: Diagnosis not present

## 2019-11-26 DIAGNOSIS — K219 Gastro-esophageal reflux disease without esophagitis: Secondary | ICD-10-CM | POA: Diagnosis not present

## 2019-12-01 DIAGNOSIS — Z9181 History of falling: Secondary | ICD-10-CM | POA: Diagnosis not present

## 2019-12-01 DIAGNOSIS — K589 Irritable bowel syndrome without diarrhea: Secondary | ICD-10-CM | POA: Diagnosis not present

## 2019-12-01 DIAGNOSIS — K219 Gastro-esophageal reflux disease without esophagitis: Secondary | ICD-10-CM | POA: Diagnosis not present

## 2019-12-01 DIAGNOSIS — R69 Illness, unspecified: Secondary | ICD-10-CM | POA: Diagnosis not present

## 2019-12-01 DIAGNOSIS — G43909 Migraine, unspecified, not intractable, without status migrainosus: Secondary | ICD-10-CM | POA: Diagnosis not present

## 2019-12-01 DIAGNOSIS — Z7982 Long term (current) use of aspirin: Secondary | ICD-10-CM | POA: Diagnosis not present

## 2019-12-01 DIAGNOSIS — K449 Diaphragmatic hernia without obstruction or gangrene: Secondary | ICD-10-CM | POA: Diagnosis not present

## 2019-12-01 DIAGNOSIS — M1990 Unspecified osteoarthritis, unspecified site: Secondary | ICD-10-CM | POA: Diagnosis not present

## 2019-12-01 DIAGNOSIS — T84090D Other mechanical complication of internal right hip prosthesis, subsequent encounter: Secondary | ICD-10-CM | POA: Diagnosis not present

## 2019-12-03 DIAGNOSIS — T84090D Other mechanical complication of internal right hip prosthesis, subsequent encounter: Secondary | ICD-10-CM | POA: Diagnosis not present

## 2019-12-03 DIAGNOSIS — M1990 Unspecified osteoarthritis, unspecified site: Secondary | ICD-10-CM | POA: Diagnosis not present

## 2019-12-03 DIAGNOSIS — Z7982 Long term (current) use of aspirin: Secondary | ICD-10-CM | POA: Diagnosis not present

## 2019-12-03 DIAGNOSIS — Z9181 History of falling: Secondary | ICD-10-CM | POA: Diagnosis not present

## 2019-12-03 DIAGNOSIS — K449 Diaphragmatic hernia without obstruction or gangrene: Secondary | ICD-10-CM | POA: Diagnosis not present

## 2019-12-03 DIAGNOSIS — G43909 Migraine, unspecified, not intractable, without status migrainosus: Secondary | ICD-10-CM | POA: Diagnosis not present

## 2019-12-03 DIAGNOSIS — K219 Gastro-esophageal reflux disease without esophagitis: Secondary | ICD-10-CM | POA: Diagnosis not present

## 2019-12-03 DIAGNOSIS — K589 Irritable bowel syndrome without diarrhea: Secondary | ICD-10-CM | POA: Diagnosis not present

## 2019-12-03 DIAGNOSIS — R69 Illness, unspecified: Secondary | ICD-10-CM | POA: Diagnosis not present

## 2019-12-07 DIAGNOSIS — K589 Irritable bowel syndrome without diarrhea: Secondary | ICD-10-CM | POA: Diagnosis not present

## 2019-12-07 DIAGNOSIS — Z7982 Long term (current) use of aspirin: Secondary | ICD-10-CM | POA: Diagnosis not present

## 2019-12-07 DIAGNOSIS — Z9181 History of falling: Secondary | ICD-10-CM | POA: Diagnosis not present

## 2019-12-07 DIAGNOSIS — M1990 Unspecified osteoarthritis, unspecified site: Secondary | ICD-10-CM | POA: Diagnosis not present

## 2019-12-07 DIAGNOSIS — R69 Illness, unspecified: Secondary | ICD-10-CM | POA: Diagnosis not present

## 2019-12-07 DIAGNOSIS — K449 Diaphragmatic hernia without obstruction or gangrene: Secondary | ICD-10-CM | POA: Diagnosis not present

## 2019-12-07 DIAGNOSIS — K219 Gastro-esophageal reflux disease without esophagitis: Secondary | ICD-10-CM | POA: Diagnosis not present

## 2019-12-07 DIAGNOSIS — G43909 Migraine, unspecified, not intractable, without status migrainosus: Secondary | ICD-10-CM | POA: Diagnosis not present

## 2019-12-07 DIAGNOSIS — T84090D Other mechanical complication of internal right hip prosthesis, subsequent encounter: Secondary | ICD-10-CM | POA: Diagnosis not present

## 2019-12-09 DIAGNOSIS — G43909 Migraine, unspecified, not intractable, without status migrainosus: Secondary | ICD-10-CM | POA: Diagnosis not present

## 2019-12-09 DIAGNOSIS — R69 Illness, unspecified: Secondary | ICD-10-CM | POA: Diagnosis not present

## 2019-12-09 DIAGNOSIS — Z9181 History of falling: Secondary | ICD-10-CM | POA: Diagnosis not present

## 2019-12-09 DIAGNOSIS — M1990 Unspecified osteoarthritis, unspecified site: Secondary | ICD-10-CM | POA: Diagnosis not present

## 2019-12-09 DIAGNOSIS — K589 Irritable bowel syndrome without diarrhea: Secondary | ICD-10-CM | POA: Diagnosis not present

## 2019-12-09 DIAGNOSIS — K449 Diaphragmatic hernia without obstruction or gangrene: Secondary | ICD-10-CM | POA: Diagnosis not present

## 2019-12-09 DIAGNOSIS — T84090D Other mechanical complication of internal right hip prosthesis, subsequent encounter: Secondary | ICD-10-CM | POA: Diagnosis not present

## 2019-12-09 DIAGNOSIS — Z7982 Long term (current) use of aspirin: Secondary | ICD-10-CM | POA: Diagnosis not present

## 2019-12-09 DIAGNOSIS — K219 Gastro-esophageal reflux disease without esophagitis: Secondary | ICD-10-CM | POA: Diagnosis not present

## 2019-12-13 DIAGNOSIS — M2042 Other hammer toe(s) (acquired), left foot: Secondary | ICD-10-CM | POA: Diagnosis not present

## 2019-12-13 DIAGNOSIS — M2011 Hallux valgus (acquired), right foot: Secondary | ICD-10-CM | POA: Diagnosis not present

## 2019-12-13 DIAGNOSIS — M2041 Other hammer toe(s) (acquired), right foot: Secondary | ICD-10-CM | POA: Diagnosis not present

## 2019-12-13 DIAGNOSIS — M2012 Hallux valgus (acquired), left foot: Secondary | ICD-10-CM | POA: Diagnosis not present

## 2019-12-13 DIAGNOSIS — L603 Nail dystrophy: Secondary | ICD-10-CM | POA: Diagnosis not present

## 2019-12-13 DIAGNOSIS — L600 Ingrowing nail: Secondary | ICD-10-CM | POA: Diagnosis not present

## 2019-12-13 DIAGNOSIS — B351 Tinea unguium: Secondary | ICD-10-CM | POA: Diagnosis not present

## 2019-12-14 DIAGNOSIS — M1990 Unspecified osteoarthritis, unspecified site: Secondary | ICD-10-CM | POA: Diagnosis not present

## 2019-12-14 DIAGNOSIS — Z9181 History of falling: Secondary | ICD-10-CM | POA: Diagnosis not present

## 2019-12-14 DIAGNOSIS — K589 Irritable bowel syndrome without diarrhea: Secondary | ICD-10-CM | POA: Diagnosis not present

## 2019-12-14 DIAGNOSIS — Z7982 Long term (current) use of aspirin: Secondary | ICD-10-CM | POA: Diagnosis not present

## 2019-12-14 DIAGNOSIS — T84090D Other mechanical complication of internal right hip prosthesis, subsequent encounter: Secondary | ICD-10-CM | POA: Diagnosis not present

## 2019-12-14 DIAGNOSIS — K449 Diaphragmatic hernia without obstruction or gangrene: Secondary | ICD-10-CM | POA: Diagnosis not present

## 2019-12-14 DIAGNOSIS — K219 Gastro-esophageal reflux disease without esophagitis: Secondary | ICD-10-CM | POA: Diagnosis not present

## 2019-12-14 DIAGNOSIS — G43909 Migraine, unspecified, not intractable, without status migrainosus: Secondary | ICD-10-CM | POA: Diagnosis not present

## 2019-12-14 DIAGNOSIS — R69 Illness, unspecified: Secondary | ICD-10-CM | POA: Diagnosis not present

## 2019-12-17 ENCOUNTER — Ambulatory Visit: Payer: Self-pay | Admitting: Podiatry

## 2019-12-17 DIAGNOSIS — T84090D Other mechanical complication of internal right hip prosthesis, subsequent encounter: Secondary | ICD-10-CM | POA: Diagnosis not present

## 2019-12-17 DIAGNOSIS — Z9181 History of falling: Secondary | ICD-10-CM | POA: Diagnosis not present

## 2019-12-17 DIAGNOSIS — R69 Illness, unspecified: Secondary | ICD-10-CM | POA: Diagnosis not present

## 2019-12-17 DIAGNOSIS — G43909 Migraine, unspecified, not intractable, without status migrainosus: Secondary | ICD-10-CM | POA: Diagnosis not present

## 2019-12-17 DIAGNOSIS — Z7982 Long term (current) use of aspirin: Secondary | ICD-10-CM | POA: Diagnosis not present

## 2019-12-17 DIAGNOSIS — M1990 Unspecified osteoarthritis, unspecified site: Secondary | ICD-10-CM | POA: Diagnosis not present

## 2019-12-17 DIAGNOSIS — K219 Gastro-esophageal reflux disease without esophagitis: Secondary | ICD-10-CM | POA: Diagnosis not present

## 2019-12-17 DIAGNOSIS — K449 Diaphragmatic hernia without obstruction or gangrene: Secondary | ICD-10-CM | POA: Diagnosis not present

## 2019-12-17 DIAGNOSIS — K589 Irritable bowel syndrome without diarrhea: Secondary | ICD-10-CM | POA: Diagnosis not present

## 2019-12-22 DIAGNOSIS — T84090D Other mechanical complication of internal right hip prosthesis, subsequent encounter: Secondary | ICD-10-CM | POA: Diagnosis not present

## 2019-12-22 DIAGNOSIS — K589 Irritable bowel syndrome without diarrhea: Secondary | ICD-10-CM | POA: Diagnosis not present

## 2019-12-22 DIAGNOSIS — R69 Illness, unspecified: Secondary | ICD-10-CM | POA: Diagnosis not present

## 2019-12-22 DIAGNOSIS — Z9181 History of falling: Secondary | ICD-10-CM | POA: Diagnosis not present

## 2019-12-22 DIAGNOSIS — K449 Diaphragmatic hernia without obstruction or gangrene: Secondary | ICD-10-CM | POA: Diagnosis not present

## 2019-12-22 DIAGNOSIS — K219 Gastro-esophageal reflux disease without esophagitis: Secondary | ICD-10-CM | POA: Diagnosis not present

## 2019-12-22 DIAGNOSIS — M1990 Unspecified osteoarthritis, unspecified site: Secondary | ICD-10-CM | POA: Diagnosis not present

## 2019-12-22 DIAGNOSIS — Z7982 Long term (current) use of aspirin: Secondary | ICD-10-CM | POA: Diagnosis not present

## 2019-12-22 DIAGNOSIS — G43909 Migraine, unspecified, not intractable, without status migrainosus: Secondary | ICD-10-CM | POA: Diagnosis not present

## 2019-12-28 DIAGNOSIS — Z471 Aftercare following joint replacement surgery: Secondary | ICD-10-CM | POA: Diagnosis not present

## 2019-12-28 DIAGNOSIS — Z96641 Presence of right artificial hip joint: Secondary | ICD-10-CM | POA: Diagnosis not present

## 2019-12-29 DIAGNOSIS — Z7982 Long term (current) use of aspirin: Secondary | ICD-10-CM | POA: Diagnosis not present

## 2019-12-29 DIAGNOSIS — T84090D Other mechanical complication of internal right hip prosthesis, subsequent encounter: Secondary | ICD-10-CM | POA: Diagnosis not present

## 2019-12-29 DIAGNOSIS — K589 Irritable bowel syndrome without diarrhea: Secondary | ICD-10-CM | POA: Diagnosis not present

## 2019-12-29 DIAGNOSIS — M1990 Unspecified osteoarthritis, unspecified site: Secondary | ICD-10-CM | POA: Diagnosis not present

## 2019-12-29 DIAGNOSIS — R69 Illness, unspecified: Secondary | ICD-10-CM | POA: Diagnosis not present

## 2019-12-29 DIAGNOSIS — K449 Diaphragmatic hernia without obstruction or gangrene: Secondary | ICD-10-CM | POA: Diagnosis not present

## 2019-12-29 DIAGNOSIS — K219 Gastro-esophageal reflux disease without esophagitis: Secondary | ICD-10-CM | POA: Diagnosis not present

## 2019-12-29 DIAGNOSIS — Z9181 History of falling: Secondary | ICD-10-CM | POA: Diagnosis not present

## 2019-12-29 DIAGNOSIS — G43909 Migraine, unspecified, not intractable, without status migrainosus: Secondary | ICD-10-CM | POA: Diagnosis not present

## 2020-01-05 ENCOUNTER — Ambulatory Visit: Payer: Medicare HMO | Admitting: Physical Therapy

## 2020-01-13 ENCOUNTER — Ambulatory Visit (INDEPENDENT_AMBULATORY_CARE_PROVIDER_SITE_OTHER): Payer: Medicare HMO | Admitting: Rehabilitative and Restorative Service Providers"

## 2020-01-13 ENCOUNTER — Encounter: Payer: Self-pay | Admitting: Rehabilitative and Restorative Service Providers"

## 2020-01-13 ENCOUNTER — Other Ambulatory Visit: Payer: Self-pay

## 2020-01-13 DIAGNOSIS — E669 Obesity, unspecified: Secondary | ICD-10-CM | POA: Diagnosis not present

## 2020-01-13 DIAGNOSIS — M6281 Muscle weakness (generalized): Secondary | ICD-10-CM | POA: Diagnosis not present

## 2020-01-13 DIAGNOSIS — R269 Unspecified abnormalities of gait and mobility: Secondary | ICD-10-CM | POA: Diagnosis not present

## 2020-01-13 DIAGNOSIS — M1990 Unspecified osteoarthritis, unspecified site: Secondary | ICD-10-CM | POA: Diagnosis not present

## 2020-01-13 DIAGNOSIS — G43909 Migraine, unspecified, not intractable, without status migrainosus: Secondary | ICD-10-CM | POA: Diagnosis not present

## 2020-01-13 DIAGNOSIS — M25551 Pain in right hip: Secondary | ICD-10-CM | POA: Diagnosis not present

## 2020-01-13 DIAGNOSIS — R2689 Other abnormalities of gait and mobility: Secondary | ICD-10-CM | POA: Diagnosis not present

## 2020-01-13 DIAGNOSIS — R69 Illness, unspecified: Secondary | ICD-10-CM | POA: Diagnosis not present

## 2020-01-13 DIAGNOSIS — Z6833 Body mass index (BMI) 33.0-33.9, adult: Secondary | ICD-10-CM | POA: Diagnosis not present

## 2020-01-13 DIAGNOSIS — R030 Elevated blood-pressure reading, without diagnosis of hypertension: Secondary | ICD-10-CM | POA: Diagnosis not present

## 2020-01-13 DIAGNOSIS — R32 Unspecified urinary incontinence: Secondary | ICD-10-CM | POA: Diagnosis not present

## 2020-01-13 DIAGNOSIS — F419 Anxiety disorder, unspecified: Secondary | ICD-10-CM | POA: Diagnosis not present

## 2020-01-13 DIAGNOSIS — B009 Herpesviral infection, unspecified: Secondary | ICD-10-CM | POA: Diagnosis not present

## 2020-01-13 NOTE — Therapy (Deleted)
[place], [place], [address] Phone: [phone]   Fax:  [phone]  Physical Therapy Treatment  Patient Details  Name: Melinda Thompson MRN: [id number] Date of Birth: Nov 07, 1950 Referring Provider (PT): Hector Shade, MD   Encounter Date: 01/13/2020   PT End of Session - 01/13/20 1648    Visit Number 1    Number of Visits 12    Date for PT Re-Evaluation 02/24/20    PT Start Time 7616    PT Stop Time 1530    PT Time Calculation (min) 42 min           Past Medical History:  Diagnosis Date  . Anxiety   . Arthritis    osteoarthritis  . GERD (gastroesophageal reflux disease)   . Headache    migraines  . History of hiatal hernia     Past Surgical History:  Procedure Laterality Date  . JOINT REPLACEMENT     bil hip  . TOTAL HIP ARTHROPLASTY     bil  . TOTAL HIP REVISION Right 11/15/2019   Procedure: TOTAL HIP REVISION;  Surgeon: Gaynelle Arabian, MD;  Location: [facility];  Service: Orthopedics;  Laterality: Right;  121min  . vein surgey      inner thigh left leg has a ? stent done in office 1980's    There were no vitals filed for this visit.   Subjective Assessment - 01/13/20 1453    Subjective The patient is s/p R THR on 11/15/19 (she reports initial hip replacement from 2005 stem broke).  She had home health PT and is now transitioning to outpatient physical therapy.  Her son passed away suddenly and this has slowed her participation in exercises over the past 2 weeks.  She conitnues to have R knee pain, tightness in the R hip, swelling in the lower leg, achiness in the R hip.  She is currently ambulating with one axillary crutch.    Patient Stated Goals I want to be able to walk without a cane.    Currently in Pain? Yes    Pain Location Hip    Pain Orientation Right    Pain Descriptors / Indicators Aching    Pain Type Surgical pain    Pain Onset More than a month ago    Pain Frequency  Intermittent    Aggravating Factors  walking (creates tightness)    Pain Relieving Factors muscle relaxers              [facility] PT Assessment - 01/13/20 1452      Assessment   Medical Diagnosis R total hip     Referring Provider (PT) Hector Shade, MD    Onset Date/Surgical Date 11/15/19      Precautions   Precautions Posterior Hip      Restrictions   Weight Bearing Restrictions Yes    RLE Weight Bearing Weight bearing as tolerated      Balance Screen   Has the patient fallen in the past 6 months No    Has the patient had a decrease in activity level because of a fear of falling?  No    Is the patient reluctant to leave their home because of a fear of falling?  No      Home Environment   Living Environment Private residence    Living Arrangements Alone   son just passed away and was living with patient   Type of [place]  Access Stairs to enter    Entrance Stairs-Number of Steps 4    Entrance Stairs-Rails Right   going up   Home Layout One level;Laundry or work area in [facility] - 2 wheels      Prior Function   Level of [place] Unemployed   taking time away from work   [employer] was working for [employer]   Overall Cognitive Status Within [employer] for tasks assessed      Observation/Other Assessments   Focus on Therapeutic Outcomes (FOTO)  64% limitation      Sensation   Light Touch Appears Intact    Additional Comments does note some tingling over the incision (notes it is superficial)      Posture/Postural Control   Posture/Postural Control Postural limitations    Postural Limitations Rounded Shoulders      ROM / Strength   AROM / PROM / Strength AROM;Strength      AROM   AROM Assessment Site Hip    Right/Left Hip Right;Left    Right Hip Flexion 90    Right Hip ABduction 18      Strength   Overall Strength Deficits    Strength  Assessment Site Hip;Knee;Ankle    Right/Left Hip Right;Left    Right Hip Flexion 3/5    Right Hip ABduction 2/5    Left Hip Flexion 4+/5    Right/Left Knee Right;Left    Right Knee Flexion 5/5    Right Knee Extension 4+/5    Left Knee Flexion 5/5    Left Knee Extension 5/5    Right/Left Ankle Right;Left    Right Ankle Dorsiflexion 5/5    Left Ankle Dorsiflexion 5/5      Flexibility   Soft Tissue Assessment /Muscle Length yes    Hamstrings tightness noted in R HS, gastrocs, hip flexors and IT band      Ambulation/Gait   Ambulation/Gait Yes    Ambulation/Gait Assistance 6: Modified independent (Device/Increase time)    Ambulation Distance (Feet) 75 Feet    Assistive device L Axillary Crutch    Gait Pattern Decreased step length - left;Decreased stance time - right;Step-through pattern;Decreased weight shift to right    Ambulation Surface Level;Indoor    Gait velocity 2.35 ft/sec    Stairs Yes    Stairs Assistance 6: Modified independent (Device/Increase time)    Stair Management Technique Step to pattern;One rail Right;With crutches   one crutch   Number of Stairs 4                         [facility] Adult PT Treatment/Exercise - 01/13/20 1452      Exercises   Exercises Knee/Hip      Knee/Hip Exercises: Standing   Heel Raises Both;10 reps    Functional Squat 10 reps    Functional Squat Limitations mini squats      Knee/Hip Exercises: Supine   Bridges Strengthening;Both;10 reps    Other Supine Knee/Hip Exercises supine hip abduction x 10 reps      Knee/Hip Exercises: Sidelying   Hip ABduction Strengthening;Right;5 reps    Hip ABduction Limitations unable to tolerate with increased hip pain                  PT Education - 01/13/20 1526    Education Details HEP    Person(s) Educated Patient  Methods Explanation;Demonstration;Handout    Comprehension Verbalized understanding;Returned demonstration               PT Long Term Goals -  01/13/20 1650      PT LONG TERM GOAL #1   Title The patient will be indep with HEP for LE strengthening.    Time 6    Period Weeks    Target Date 02/24/20      PT LONG TERM GOAL #2   Title The patient will reduce functional limitation in FOTO from  64% limitation to < or equal to 41% limitation.    Time 6    Period Weeks    Target Date 02/24/20      PT LONG TERM GOAL #3   Title The patient will ambulate without a device for household and community distances (>1000') independently.    Time 6    Period Weeks    Target Date 02/24/20      PT LONG TERM GOAL #4   Title The patient will negotiate 12 steps with one handrail and a reciprocal pattern mod indep.    Time 6    Period Weeks    Target Date 02/24/20      PT LONG TERM GOAL #5   Title The patient will demo hip abduction to > or equal to 4/5 strength to improve lateral hip stability.    Time 6    Period Weeks    Target Date 02/24/20      Additional Long Term Goals   Additional Long Term Goals Yes      PT LONG TERM GOAL #6   Title The patient will improve gait speed to > or equal to 2.8 ft/sec.    Baseline 2.3 ft/sec    Time 6    Period Weeks    Target Date 02/24/20                 Plan - 01/13/20 1659    Clinical Impression Statement The patient is a 69 year old female presenting to OP physical therapy s/p R TH revision.  She presents with impairments in hip AROM, hip strength, gait abnormalities, knee pain, and general deconditioning.  She has functional limitations including dec'd household ambulation, dec'd community mobility, dec'd ADL performance.    Stability/Clinical Decision Making Stable/Uncomplicated    Clinical Decision Making Low    Rehab Potential Good    PT Frequency 2x / week    PT Duration 6 weeks    PT Treatment/Interventions ADLs/Self Care Home Management;Gait training;Stair training;Functional mobility training;Therapeutic activities;Therapeutic exercise;Patient/family education;Electrical  Stimulation;Cryotherapy;Moist Heat;Manual techniques;Taping    PT Next Visit Plan progress HEP (try clamshell for abduction), hamstring stretching, taping for scar mgmt, LE strengthening, gait with SPC, stair training.    PT Home Exercise Plan [email or web address]    Consulted and Agree with Plan of Care Patient           Patient will benefit from skilled therapeutic intervention in order to improve the following deficits and impairments:  Abnormal gait, Pain, Impaired flexibility, Decreased strength, Decreased range of motion, Increased fascial restricitons  Visit Diagnosis: Pain in right hip  Muscle weakness (generalized)  Other abnormalities of gait and mobility     Problem List Patient Active Problem List   Diagnosis Date Noted  . 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 IRRITABLE BOWEL SYNDROME 03/16/2010  . HIATAL HERNIA 02/12/2010  . DEPRESSION 01/04/2010  . GERD 01/04/2010  . CONSTIPATION 01/04/2010  . CHEST PAIN  01/04/2010  . PERSONAL HISTORY OF ARTHRITIS 01/04/2010    Terryl Molinelli, PT 01/13/2020, 6:17 PM  [facility] [place], [place], [address] Phone: [phone]   Fax:  [phone]  Name: AMINATA BUFFALO MRN: [id number] Date of Birth: 19-Jun-1951

## 2020-01-13 NOTE — Therapy (Signed)
[place], [place], [address] Phone: [phone]   Fax:  [phone]  Physical Therapy Evaluation  Patient Details  Name: Melinda Thompson MRN: [id number] Date of Birth: [birth year] Referring Provider (PT): Hector Shade, MD   Encounter Date: 01/13/2020   PT End of Session - 01/13/20 1648    Visit Number 1    Number of Visits 12    Date for PT Re-Evaluation 02/24/20    PT Start Time 4259    PT Stop Time 5638    PT Time Calculation (min) 42 min           Past Medical History:  Diagnosis Date   Anxiety    Arthritis    osteoarthritis   GERD (gastroesophageal reflux disease)    Headache    migraines   History of hiatal hernia     Past Surgical History:  Procedure Laterality Date   JOINT REPLACEMENT     bil hip   TOTAL HIP ARTHROPLASTY     bil   TOTAL HIP REVISION Right 11/15/2019   Procedure: TOTAL HIP REVISION;  Surgeon: Gaynelle Arabian, MD;  Location: [facility];  Service: Orthopedics;  Laterality: Right;  128min   vein surgey      inner thigh left leg has a ? stent done in office 1980's    There were no vitals filed for this visit.    Subjective Assessment - 01/13/20 1453    Subjective The patient is s/p R THR on 11/15/19 (she reports initial hip replacement from 2005 stem broke).  She had home health PT and is now transitioning to outpatient physical therapy.  Her son passed away suddenly and this has slowed her participation in exercises over the past 2 weeks.  She conitnues to have R knee pain, tightness in the R hip, swelling in the lower leg, achiness in the R hip.  She is currently ambulating with one axillary crutch.    Patient Stated Goals I want to be able to walk without a cane.    Currently in Pain? Yes    Pain Location Hip    Pain Orientation Right    Pain Descriptors / Indicators Aching    Pain Type Surgical pain    Pain Onset More than a month ago    Pain  Frequency Intermittent    Aggravating Factors  walking (creates tightness)    Pain Relieving Factors muscle relaxers              [facility] PT Assessment - 01/13/20 1452      Assessment   Medical Diagnosis R total hip     Referring Provider (PT) Hector Shade, MD    Onset Date/Surgical Date 11/15/19      Precautions   Precautions Posterior Hip      Restrictions   Weight Bearing Restrictions Yes    RLE Weight Bearing Weight bearing as tolerated      Balance Screen   Has the patient fallen in the past 6 months No    Has the patient had a decrease in activity level because of a fear of falling?  No    Is the patient reluctant to leave their home because of a fear of falling?  No      Home Environment   Living Environment Private residence    Living Arrangements Alone   son just passed away and was living with patient   Type of Home House  Home Access Stairs to enter    Entrance Stairs-Number of Steps 4    Entrance Stairs-Rails Right   going up   Home Layout One level;Laundry or work area in [facility] - 2 wheels      Prior Function   Level of [place] Unemployed   taking time away from work   [employer] was working for [employer]   Overall Cognitive Status Within [employer] for tasks assessed      Observation/Other Assessments   Focus on Therapeutic Outcomes (FOTO)  64% limitation      Sensation   Light Touch Appears Intact    Additional Comments does note some tingling over the incision (notes it is superficial)      Posture/Postural Control   Posture/Postural Control Postural limitations    Postural Limitations Rounded Shoulders      ROM / Strength   AROM / PROM / Strength AROM;Strength      AROM   AROM Assessment Site Hip    Right/Left Hip Right;Left    Right Hip Flexion 90    Right Hip ABduction 18      Strength   Overall Strength Deficits     Strength Assessment Site Hip;Knee;Ankle    Right/Left Hip Right;Left    Right Hip Flexion 3/5    Right Hip ABduction 2/5    Left Hip Flexion 4+/5    Right/Left Knee Right;Left    Right Knee Flexion 5/5    Right Knee Extension 4+/5    Left Knee Flexion 5/5    Left Knee Extension 5/5    Right/Left Ankle Right;Left    Right Ankle Dorsiflexion 5/5    Left Ankle Dorsiflexion 5/5      Flexibility   Soft Tissue Assessment /Muscle Length yes    Hamstrings tightness noted in R HS, gastrocs, hip flexors and IT band      Ambulation/Gait   Ambulation/Gait Yes    Ambulation/Gait Assistance 6: Modified independent (Device/Increase time)    Ambulation Distance (Feet) 75 Feet    Assistive device L Axillary Crutch    Gait Pattern Decreased step length - left;Decreased stance time - right;Step-through pattern;Decreased weight shift to right    Ambulation Surface Level;Indoor    Gait velocity 2.35 ft/sec    Stairs Yes    Stairs Assistance 6: Modified independent (Device/Increase time)    Stair Management Technique Step to pattern;One rail Right;With crutches   one crutch   Number of Stairs 4                      Objective measurements completed on examination: See above findings.       [facility] Adult PT Treatment/Exercise - 01/13/20 1452      Exercises   Exercises Knee/Hip      Knee/Hip Exercises: Standing   Heel Raises Both;10 reps    Functional Squat 10 reps    Functional Squat Limitations mini squats      Knee/Hip Exercises: Supine   Bridges Strengthening;Both;10 reps    Other Supine Knee/Hip Exercises supine hip abduction x 10 reps      Knee/Hip Exercises: Sidelying   Hip ABduction Strengthening;Right;5 reps    Hip ABduction Limitations unable to tolerate with increased hip pain      Manual Therapy   Manual Therapy Soft tissue mobilization    Manual therapy comments scar tissue mobilization  Soft tissue mobilization R hip/ provided HEP handout                   PT Education - 01/13/20 1526    Education Details HEP    Person(s) Educated Patient    Methods Explanation;Demonstration;Handout    Comprehension Verbalized understanding;Returned demonstration               PT Long Term Goals - 01/13/20 1650      PT LONG TERM GOAL #1   Title The patient will be indep with HEP for LE strengthening.    Time 6    Period Weeks    Target Date 02/24/20      PT LONG TERM GOAL #2   Title The patient will reduce functional limitation in FOTO from  64% limitation to < or equal to 41% limitation.    Time 6    Period Weeks    Target Date 02/24/20      PT LONG TERM GOAL #3   Title The patient will ambulate without a device for household and community distances (>1000') independently.    Time 6    Period Weeks    Target Date 02/24/20      PT LONG TERM GOAL #4   Title The patient will negotiate 12 steps with one handrail and a reciprocal pattern mod indep.    Time 6    Period Weeks    Target Date 02/24/20      PT LONG TERM GOAL #5   Title The patient will demo hip abduction to > or equal to 4/5 strength to improve lateral hip stability.    Time 6    Period Weeks    Target Date 02/24/20      Additional Long Term Goals   Additional Long Term Goals Yes      PT LONG TERM GOAL #6   Title The patient will improve gait speed to > or equal to 2.8 ft/sec.    Baseline 2.3 ft/sec    Time 6    Period Weeks    Target Date 02/24/20                  Plan - 01/13/20 1659    Clinical Impression Statement The patient is a 69 year old female presenting to OP physical therapy s/p R TH revision.  She presents with impairments in hip AROM, hip strength, gait abnormalities, knee pain, and general deconditioning.  She has functional limitations including dec'd household ambulation, dec'd community mobility, dec'd ADL performance.    Stability/Clinical Decision Making Stable/Uncomplicated    Clinical Decision Making Low    Rehab  Potential Good    PT Frequency 2x / week    PT Duration 6 weeks    PT Treatment/Interventions ADLs/Self Care Home Management;Gait training;Stair training;Functional mobility training;Therapeutic activities;Therapeutic exercise;Patient/family education;Electrical Stimulation;Cryotherapy;Moist Heat;Manual techniques;Taping    PT Next Visit Plan progress HEP (try clamshell for abduction), hamstring stretching, taping for scar mgmt, LE strengthening, gait with SPC, stair training.    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

## 2020-01-13 NOTE — Patient Instructions (Signed)
Access Code: [email or web address] URL: [email or web address] Date: 01/13/2020 Prepared by: Rudell Cobb  Exercises Supine Bridge - 2 x daily - 7 x weekly - 1 sets - 10 reps - 5 seconds hold Supine Hip Abduction AROM - 2 x daily - 7 x weekly - 1 sets - 10 reps Mini Squat with Counter Support - 2 x daily - 7 x weekly - 1 sets - 10 reps  Patient Education Scar Massage

## 2020-01-17 ENCOUNTER — Other Ambulatory Visit: Payer: Self-pay

## 2020-01-17 ENCOUNTER — Encounter: Payer: Self-pay | Admitting: Physical Therapy

## 2020-01-17 ENCOUNTER — Ambulatory Visit (INDEPENDENT_AMBULATORY_CARE_PROVIDER_SITE_OTHER): Payer: Medicare HMO | Admitting: Physical Therapy

## 2020-01-17 DIAGNOSIS — M6281 Muscle weakness (generalized): Secondary | ICD-10-CM | POA: Diagnosis not present

## 2020-01-17 DIAGNOSIS — M25551 Pain in right hip: Secondary | ICD-10-CM | POA: Diagnosis not present

## 2020-01-17 DIAGNOSIS — R2689 Other abnormalities of gait and mobility: Secondary | ICD-10-CM

## 2020-01-17 NOTE — Therapy (Addendum)
[place], [place], [address] Phone: [phone]   Fax:  [phone]  Physical Therapy Treatment  Patient Details  Name: Melinda Thompson MRN: [id number] Date of Birth: January 22, 1951 Referring Provider (PT): Hector Shade, MD   Encounter Date: 01/17/2020   PT End of Session - 01/17/20 1605    Visit Number 2    Number of Visits 12    Date for PT Re-Evaluation 02/24/20    PT Start Time 7062    PT Stop Time 1652    PT Time Calculation (min) 47 min    Activity Tolerance Patient tolerated treatment well    Behavior During Therapy [facility] for tasks assessed/performed           Past Medical History:  Diagnosis Date  . Anxiety   . Arthritis    osteoarthritis  . GERD (gastroesophageal reflux disease)   . Headache    migraines  . History of hiatal hernia     Past Surgical History:  Procedure Laterality Date  . JOINT REPLACEMENT     bil hip  . TOTAL HIP ARTHROPLASTY     bil  . TOTAL HIP REVISION Right 11/15/2019   Procedure: TOTAL HIP REVISION;  Surgeon: Gaynelle Arabian, MD;  Location: [facility];  Service: Orthopedics;  Laterality: Right;  131min  . vein surgey      inner thigh left leg has a ? stent done in office 1980's    There were no vitals filed for this visit.   Subjective Assessment - 01/17/20 1605    Subjective Pt reports she has been massaging her incision and thigh with cocoa butter. "Today has been a coping day", hasn't done much but sit around.    Currently in Pain? Yes    Pain Score 2     Pain Location Leg   hamstring to knee   Pain Orientation Right    Pain Descriptors / Indicators Aching    Pain Relieving Factors medicine              [facility] PT Assessment - 01/17/20 0001      Assessment   Medical Diagnosis R total hip     Referring Provider (PT) Hector Shade, MD    Onset Date/Surgical Date 11/15/19    Next MD Visit 02/01/20      Flexibility   Soft Tissue Assessment /Muscle  Length yes    Quadriceps Rt:  85 deg, Lt  107 deg            [facility] Adult PT Treatment/Exercise - 01/17/20 0001      Self-Care   Self-Care Other Self-Care Comments    Other Self-Care Comments  reviewed posterior hip precautions. Pt verbalized all 3 with cues.        Knee/Hip Exercises: Stretches   Passive Hamstring Stretch Right;3 reps;20 seconds   supine with strap   Quad Stretch Right;30 seconds;3 reps;Left;2 reps   prone with strap   Quad Stretch Limitations and seated with foot under chair x 15 sec x 2 reps      Knee/Hip Exercises: Aerobic   Nustep L4: 6 min legs only.       Knee/Hip Exercises: Standing   Hip Extension Stengthening;Right;Knee straight;10 reps;Left;1 set    Lateral Step Up Right;1 set;10 reps;Hand Hold: 2;Step Height: 4"    Forward Step Up Right;1 set;10 reps;Hand Hold: 2;Step Height: 4"   12 reps   Step Down Left;1 set;10 reps;Hand Hold: 2;Step Height: 4"  Functional Squat 1 set;10 reps   mini squats   Other Standing Knee Exercises side to side wt shifts with increased time on RLE, UE support on rail and forward backward wt shifts.       Knee/Hip Exercises: Seated   Sit to Sand 10 reps;without UE support   NuStep chair     Knee/Hip Exercises: Supine   Bridges Strengthening;Both;10 reps      Knee/Hip Exercises: Sidelying   Clams RLE x 10, pillow between knees.       Modalities   Modalities --   will ice at home.                       PT Long Term Goals - 01/13/20 1650      PT LONG TERM GOAL #1   Title The patient will be indep with HEP for LE strengthening.    Time 6    Period Weeks    Target Date 02/24/20      PT LONG TERM GOAL #2   Title The patient will reduce functional limitation in FOTO from  64% limitation to < or equal to 41% limitation.    Time 6    Period Weeks    Target Date 02/24/20      PT LONG TERM GOAL #3   Title The patient will ambulate without a device for household and community distances (>1000')  independently.    Time 6    Period Weeks    Target Date 02/24/20      PT LONG TERM GOAL #4   Title The patient will negotiate 12 steps with one handrail and a reciprocal pattern mod indep.    Time 6    Period Weeks    Target Date 02/24/20      PT LONG TERM GOAL #5   Title The patient will demo hip abduction to > or equal to 4/5 strength to improve lateral hip stability.    Time 6    Period Weeks    Target Date 02/24/20      Additional Long Term Goals   Additional Long Term Goals Yes      PT LONG TERM GOAL #6   Title The patient will improve gait speed to > or equal to 2.8 ft/sec.    Baseline 2.3 ft/sec    Time 6    Period Weeks    Target Date 02/24/20                 Plan - 01/17/20 1630    Clinical Impression Statement Pt now ambulating with single crutch and cane. Pt has tight R quad; will add quad stretch to HEP.  She reports many times during session of having fear of RLE buckling; encouraged wt shifts at counter during day to gain confidence in LE.  Pt tolerated all exercises without increase in pain.Goals are ongoing.    Stability/Clinical Decision Making Stable/Uncomplicated    Rehab Potential Good    PT Frequency 2x / week    PT Duration 6 weeks    PT Treatment/Interventions ADLs/Self Care Home Management;Gait training;Stair training;Functional mobility training;Therapeutic activities;Therapeutic exercise;Patient/family education;Electrical Stimulation;Cryotherapy;Moist Heat;Manual techniques;Taping    PT Next Visit Plan progress HEP, tape for scar mgmt, LE strengthening, gait with SPC, stair training.    PT Home Exercise Plan [email or web address]    Consulted and Agree with Plan of Care Patient           Patient will benefit from skilled therapeutic  intervention in order to improve the following deficits and impairments:  Abnormal gait, Pain, Impaired flexibility, Decreased strength, Decreased range of motion, Increased fascial restricitons  Visit Diagnosis: Pain in  right hip  Muscle weakness (generalized)  Other abnormalities of gait and mobility     Problem List Patient Active Problem List   Diagnosis Date Noted  . 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 IRRITABLE BOWEL SYNDROME 03/16/2010  . HIATAL HERNIA 02/12/2010  . DEPRESSION 01/04/2010  . GERD 01/04/2010  . CONSTIPATION 01/04/2010  . CHEST PAIN 01/04/2010  . PERSONAL HISTORY OF ARTHRITIS 01/04/2010   Kerin Perna, PTA 01/17/20 5:01 PM  [place], [place], [address] Phone: [phone]   Fax:  [phone]  Name: BURNIS KASER MRN: [id number] Date of Birth: 1951-02-24

## 2020-01-19 ENCOUNTER — Encounter: Payer: Medicare HMO | Admitting: Physical Therapy

## 2020-01-19 ENCOUNTER — Other Ambulatory Visit: Payer: Self-pay

## 2020-01-19 ENCOUNTER — Ambulatory Visit: Payer: Medicare HMO | Admitting: Physical Therapy

## 2020-01-19 DIAGNOSIS — M25551 Pain in right hip: Secondary | ICD-10-CM

## 2020-01-19 DIAGNOSIS — R2689 Other abnormalities of gait and mobility: Secondary | ICD-10-CM

## 2020-01-19 DIAGNOSIS — M6281 Muscle weakness (generalized): Secondary | ICD-10-CM

## 2020-01-19 NOTE — Patient Instructions (Addendum)
Kinesiology tape What is kinesiology tape?  There are many brands of kinesiology tape.  KTape, [employer], [employer], Dynamic tape, to name a few. It is an elasticized tape designed to support the body's natural healing process. This tape provides stability and support to muscles and joints without restricting motion. It can also help decrease swelling in the area of application. How does it work? The tape microscopically lifts and decompresses the skin to allow for drainage of lymph (swelling) to flow away from area, reducing inflammation.  The tape has the ability to help re-educate the neuromuscular system by targeting specific receptors in the skin.  The presence of the tape increases the body's awareness of posture and body mechanics.  Do not use with: . Open wounds . Skin lesions . Adhesive allergies Safe removal of the tape: In some rare cases, mild/moderate skin irritation can occur.  This can include redness, itchiness, or hives. If this occurs, immediately remove tape and consult your primary care physician if symptoms are severe or do not resolve within 2 days.  To remove tape safely, hold nearby skin with one hand and gentle roll tape down with other hand.  You can apply oil or conditioner to tape while in shower prior to removal to loosen adhesive.  DO NOT swiftly rip tape off like a band-aid, as this could cause skin tears and additional skin irritation.   Access Code: [email or web address] URL: [email or web address] Date: 07/21/2021Prepared by: [place]  Supine Bridge - 2 x daily - 7 x weekly - 1 sets - 10 reps - 5 seconds hold  Mini Squat with Counter Support - 2 x daily - 7 x weekly - 1 sets - 10 reps  Standing Hip Abduction with Counter Support - 2 x daily - 7 x weekly - 1 sets - 10 reps  Standing Hip Extension with Counter Support - 2 x daily - 7 x weekly - 1 sets - 10 reps  Standing Single Leg Stance with Counter Support - 1 x daily - 7 x  weekly - 1 sets - 3 reps - 10 hold  Heel rises with counter support - 1 x daily - 7 x weekly - 1 sets - 10 reps Patient Education  Scar Massage

## 2020-01-19 NOTE — Therapy (Signed)
[place], [place], [address] Phone: [phone]   Fax:  [phone]  Physical Therapy Treatment  Patient Details  Name: Melinda Thompson MRN: [id number] Date of Birth: 1950-07-23 Referring Provider (PT): Hector Shade, MD   Encounter Date: 01/19/2020   PT End of Session - 01/19/20 8563    Visit Number 3    Number of Visits 12    Date for PT Re-Evaluation 02/24/20    PT Start Time 1497    PT Stop Time 1230    PT Time Calculation (min) 45 min    Activity Tolerance Patient tolerated treatment well;No increased pain    Behavior During Therapy WFL for tasks assessed/performed           Past Medical History:  Diagnosis Date  . Anxiety   . Arthritis    osteoarthritis  . GERD (gastroesophageal reflux disease)   . Headache    migraines  . History of hiatal hernia     Past Surgical History:  Procedure Laterality Date  . JOINT REPLACEMENT     bil hip  . TOTAL HIP ARTHROPLASTY     bil  . TOTAL HIP REVISION Right 11/15/2019   Procedure: TOTAL HIP REVISION;  Surgeon: Gaynelle Arabian, MD;  Location: [facility];  Service: Orthopedics;  Laterality: Right;  132min  . vein surgey      inner thigh left leg has a ? stent done in office 1980's    There were no vitals filed for this visit.   Subjective Assessment - 01/19/20 1149    Subjective Pt reports she was getting muscle spasms in her Rt knee; she stretched it and took a muscle relaxer for relief.  She completed her exercises 2x yesterday.    Currently in Pain? Yes    Pain Score 4     Pain Location Leg   thigh to knee   Pain Orientation Right    Pain Descriptors / Indicators Tightness;Aching    Aggravating Factors  prolonged sitting    Pain Relieving Factors ice              [facility] PT Assessment - 01/19/20 0001      Assessment   Medical Diagnosis R total hip     Referring Provider (PT) Hector Shade, MD    Onset Date/Surgical Date 11/15/19     Next MD Visit 02/01/20           [place] Adult PT Treatment/Exercise - 01/19/20 0001      Knee/Hip Exercises: Stretches   Passive Hamstring Stretch Right;3 reps;20 seconds   supine with strap   Quad Stretch Right;2 reps;30 seconds   seated   Gastroc Stretch Both;2 reps;20 seconds   incline board     Knee/Hip Exercises: Aerobic   Nustep L4: 5 min legs only.       Knee/Hip Exercises: Standing   Hip Abduction Stengthening;Right;Left;1 set;10 reps;Knee straight    Hip Extension Stengthening;Right;Left;1 set;10 reps;Knee straight   toe on ground, UE support on rail   Lateral Step Up Right;1 set;10 reps;Hand Hold: 2;Step Height: 4"    Forward Step Up Right;1 set;10 reps;Hand Hold: 2;Step Height: 4"    Step Down Left;1 set;10 reps;Hand Hold: 2;Step Height: 4"    Functional Squat 1 set;10 reps   mini squats   SLS 10 sec with UE support on rail x 2 reps    Other Standing Knee Exercises side to side wt shifts and forward/ backward wt  shifts onto a scale to measure WB (able to tolerate 79-85% of wt.       Manual Therapy   Manual Therapy Soft tissue mobilization;Taping    Manual therapy comments I strip of sensitive skin rock tape applied to Rt hip incision with zig zag  pattern to assist with scar managment; I strips in X pattern appied to tight area of Rt prox quad to decompress tissue and decrease pain.     Soft tissue mobilization IASTM to Rt quad (mostly mid to lateral, avoiding incision) to decrease fascial restrictions                        PT Long Term Goals - 01/13/20 1650      PT LONG TERM GOAL #1   Title The patient will be indep with HEP for LE strengthening.    Time 6    Period Weeks    Target Date 02/24/20      PT LONG TERM GOAL #2   Title The patient will reduce functional limitation in FOTO from  64% limitation to < or equal to 41% limitation.    Time 6    Period Weeks    Target Date 02/24/20      PT LONG TERM GOAL #3   Title The patient will ambulate  without a device for household and community distances (>1000') independently.    Time 6    Period Weeks    Target Date 02/24/20      PT LONG TERM GOAL #4   Title The patient will negotiate 12 steps with one handrail and a reciprocal pattern mod indep.    Time 6    Period Weeks    Target Date 02/24/20      PT LONG TERM GOAL #5   Title The patient will demo hip abduction to > or equal to 4/5 strength to improve lateral hip stability.    Time 6    Period Weeks    Target Date 02/24/20      Additional Long Term Goals   Additional Long Term Goals Yes      PT LONG TERM GOAL #6   Title The patient will improve gait speed to > or equal to 2.8 ft/sec.    Baseline 2.3 ft/sec    Time 6    Period Weeks    Target Date 02/24/20                 Plan - 01/19/20 1242    Clinical Impression Statement Pt presents with RLE weakness with functional exercises like stairs and squats; required heavy UE support at times (on rail).  Pt unable to tolerate 100% full WB on RLE yet; added weight shifts to HEP to assist with improved gait.  Trial of sensitive skin Rock tape applied to tight area of Rt prox quad and over incision for scar managment.  Goals are ongoing.    Stability/Clinical Decision Making Stable/Uncomplicated    Rehab Potential Good    PT Frequency 2x / week    PT Duration 6 weeks    PT Treatment/Interventions ADLs/Self Care Home Management;Gait training;Stair training;Functional mobility training;Therapeutic activities;Therapeutic exercise;Patient/family education;Electrical Stimulation;Cryotherapy;Moist Heat;Manual techniques;Taping    PT Next Visit Plan progress HEP, tape for scar mgmt, LE strengthening, gait with SPC, stair training.    PT Home Exercise Plan [email or web address]    Consulted and Agree with Plan of Care Patient  Patient will benefit from skilled therapeutic intervention in order to improve the following deficits and impairments:  Abnormal gait, Pain, Impaired  flexibility, Decreased strength, Decreased range of motion, Increased fascial restricitons  Visit Diagnosis: Pain in right hip  Muscle weakness (generalized)  Other abnormalities of gait and mobility     Problem List Patient Active Problem List   Diagnosis Date Noted  . Failed total hip arthroplasty ([place]) 11/15/2019  . IRRITABLE BOWEL SYNDROME 03/16/2010  . HIATAL HERNIA 02/12/2010  . DEPRESSION 01/04/2010  . GERD 01/04/2010  . CONSTIPATION 01/04/2010  . CHEST PAIN 01/04/2010  . PERSONAL HISTORY OF ARTHRITIS 01/04/2010   Kerin Perna, PTA 01/19/20 12:59 PM  [place], [place], [address] Phone: [phone]   Fax:  [phone]  Name: BRIAHNA PESCADOR MRN: [id number] Date of Birth: 1951-02-14

## 2020-01-24 ENCOUNTER — Other Ambulatory Visit: Payer: Self-pay

## 2020-01-24 ENCOUNTER — Ambulatory Visit: Payer: Medicare HMO | Admitting: Physical Therapy

## 2020-01-24 DIAGNOSIS — M6281 Muscle weakness (generalized): Secondary | ICD-10-CM

## 2020-01-24 DIAGNOSIS — M25551 Pain in right hip: Secondary | ICD-10-CM | POA: Diagnosis not present

## 2020-01-24 DIAGNOSIS — R2689 Other abnormalities of gait and mobility: Secondary | ICD-10-CM | POA: Diagnosis not present

## 2020-01-24 NOTE — Therapy (Signed)
[place], [place], [address] Phone: [phone]   Fax:  [phone]  Physical Therapy Treatment  Patient Details  Name: Melinda Thompson MRN: [id number] Date of Birth: [birth year] Referring Provider (PT): Hector Shade, MD   Encounter Date: 01/24/2020   PT End of Session - 01/24/20 1150    Visit Number 4    Number of Visits 12    Date for PT Re-Evaluation 02/24/20    PT Start Time 1057    PT Stop Time 1145    PT Time Calculation (min) 48 min    Activity Tolerance Patient tolerated treatment well;No increased pain    Behavior During Therapy WFL for tasks assessed/performed           Past Medical History:  Diagnosis Date  . Anxiety   . Arthritis    osteoarthritis  . GERD (gastroesophageal reflux disease)   . Headache    migraines  . History of hiatal hernia     Past Surgical History:  Procedure Laterality Date  . JOINT REPLACEMENT     bil hip  . TOTAL HIP ARTHROPLASTY     bil  . TOTAL HIP REVISION Right 11/15/2019   Procedure: TOTAL HIP REVISION;  Surgeon: Gaynelle Arabian, MD;  Location: [facility];  Service: Orthopedics;  Laterality: Right;  171min  . vein surgey      inner thigh left leg has a ? stent done in office 1980's    There were no vitals filed for this visit.   Subjective Assessment - 01/24/20 1105    Subjective Pt reports she did a lot of walking this weekend (ie: through [employer]); Rt thigh has felt tighter since then.  She liked the way the tape felt on her thigh.    Currently in Pain? Yes    Pain Score 4     Pain Location Leg   thigh to knee   Pain Orientation Right;Anterior    Pain Descriptors / Indicators Aching    Aggravating Factors  prolonged sitting    Pain Relieving Factors ice              [facility] PT Assessment - 01/24/20 0001      Assessment   Medical Diagnosis R total hip     Referring Provider (PT) Hector Shade, MD    Onset Date/Surgical Date  11/15/19    Next MD Visit 02/01/20      Precautions   Precautions Posterior Hip      Restrictions   RLE Weight Bearing Weight bearing as tolerated            [facility] Adult PT Treatment/Exercise - 01/24/20 0001      Knee/Hip Exercises: Stretches   Sports administrator Right;3 reps;30 seconds   prone with strap   Other Knee/Hip Stretches prone press up x 5 reps, 5 sec hold      Knee/Hip Exercises: Aerobic   Nustep L5: 5 min legs only.       Knee/Hip Exercises: Standing   Hip Abduction Stengthening;Right;Left;1 set;10 reps;Knee straight    Lateral Step Up Right;1 set;10 reps;Hand Hold: 2;Step Height: 4"    Forward Step Up Right;1 set;10 reps;Hand Hold: 2;Step Height: 4"    SLS 10 sec with UE support on rail x 2 reps    Other Standing Knee Exercises side stepping with increased step height Lt/ Rt 20 ft     Other Standing Knee Exercises staggered stance balance with horiz vertical head  turns x 30 sec       Knee/Hip Exercises: Sidelying   Hip ABduction Strengthening;Right;1 set;10 reps   pillow between knees   Hip ABduction Limitations limited lift from pillow    Clams RLE x 10 reps, pillow between knees      Knee/Hip Exercises: Prone   Straight Leg Raises Strengthening;Right;Left;1 set;10 reps      Manual Therapy   Manual therapy comments I strip of reg skin rock tape applied to Rt hip incision with zig zag  pattern to assist with scar managment; I strips in X pattern appied to tight area of Rt prox quad to decompress tissue and decrease pain.     Soft tissue mobilization IASTM to Rt quad to decrease fascial restrictions                        PT Long Term Goals - 01/13/20 1650      PT LONG TERM GOAL #1   Title The patient will be indep with HEP for LE strengthening.    Time 6    Period Weeks    Target Date 02/24/20      PT LONG TERM GOAL #2   Title The patient will reduce functional limitation in FOTO from  64% limitation to < or equal to 41% limitation.    Time 6      Period Weeks    Target Date 02/24/20      PT LONG TERM GOAL #3   Title The patient will ambulate without a device for household and community distances (>1000') independently.    Time 6    Period Weeks    Target Date 02/24/20      PT LONG TERM GOAL #4   Title The patient will negotiate 12 steps with one handrail and a reciprocal pattern mod indep.    Time 6    Period Weeks    Target Date 02/24/20      PT LONG TERM GOAL #5   Title The patient will demo hip abduction to > or equal to 4/5 strength to improve lateral hip stability.    Time 6    Period Weeks    Target Date 02/24/20      Additional Long Term Goals   Additional Long Term Goals Yes      PT LONG TERM GOAL #6   Title The patient will improve gait speed to > or equal to 2.8 ft/sec.    Baseline 2.3 ft/sec    Time 6    Period Weeks    Target Date 02/24/20                 Plan - 01/24/20 1257    Clinical Impression Statement Pt continues to have difficulty tolerating 100% WB through RLE without heavy UE support on rail/ table/ counter. Forward 4" steps are getting a little easier; RLE continues to fatigue quickly.  Pt had positive benefit from tape application to Rt thigh; retaped today. Pt progressing gradually towards goals. Pt remains motivated to progress.    Stability/Clinical Decision Making Stable/Uncomplicated    Rehab Potential Good    PT Frequency 2x / week    PT Duration 6 weeks    PT Treatment/Interventions ADLs/Self Care Home Management;Gait training;Stair training;Functional mobility training;Therapeutic activities;Therapeutic exercise;Patient/family education;Electrical Stimulation;Cryotherapy;Moist Heat;Manual techniques;Taping    PT Next Visit Plan progress HEP, tape for scar mgmt, LE strengthening, gait with SPC, stair training.    PT Home Exercise Plan  [email or web address]    Consulted and Agree with Plan of Care Patient           Patient will benefit from skilled therapeutic intervention in order  to improve the following deficits and impairments:  Abnormal gait, Pain, Impaired flexibility, Decreased strength, Decreased range of motion, Increased fascial restricitons  Visit Diagnosis: Pain in right hip  Muscle weakness (generalized)  Other abnormalities of gait and mobility     Problem List Patient Active Problem List   Diagnosis Date Noted  . Failed total hip arthroplasty ([place]) 11/15/2019  . IRRITABLE BOWEL SYNDROME 03/16/2010  . HIATAL HERNIA 02/12/2010  . DEPRESSION 01/04/2010  . GERD 01/04/2010  . CONSTIPATION 01/04/2010  . CHEST PAIN 01/04/2010  . PERSONAL HISTORY OF ARTHRITIS 01/04/2010   Kerin Perna, PTA 01/24/20 1:00 PM  [place], [place], [address] Phone: [phone]   Fax:  [phone]  Name: AUTYM SIESS MRN: [id number] Date of Birth: March 13, 1951

## 2020-01-27 ENCOUNTER — Encounter: Payer: Medicare HMO | Admitting: Physical Therapy

## 2020-01-31 ENCOUNTER — Other Ambulatory Visit: Payer: Self-pay

## 2020-01-31 ENCOUNTER — Ambulatory Visit: Payer: Medicare HMO | Admitting: Physical Therapy

## 2020-01-31 DIAGNOSIS — M25551 Pain in right hip: Secondary | ICD-10-CM

## 2020-01-31 DIAGNOSIS — R2689 Other abnormalities of gait and mobility: Secondary | ICD-10-CM

## 2020-01-31 DIAGNOSIS — M6281 Muscle weakness (generalized): Secondary | ICD-10-CM | POA: Diagnosis not present

## 2020-01-31 NOTE — Therapy (Signed)
[place], [place], [address] Phone: [phone]   Fax:  [phone]  Physical Therapy Treatment  Patient Details  Name: Melinda Thompson MRN: [id number] Date of Birth: 1950-07-18 Referring Provider (PT): Hector Shade, MD   Encounter Date: 01/31/2020   PT End of Session - 01/31/20 1235    Visit Number 5    Number of Visits 12    Date for PT Re-Evaluation 02/24/20    PT Start Time 8416    PT Stop Time 1239    PT Time Calculation (min) 52 min    Activity Tolerance Patient tolerated treatment well;No increased pain    Behavior During Therapy WFL for tasks assessed/performed           Past Medical History:  Diagnosis Date  . Anxiety   . Arthritis    osteoarthritis  . GERD (gastroesophageal reflux disease)   . Headache    migraines  . History of hiatal hernia     Past Surgical History:  Procedure Laterality Date  . JOINT REPLACEMENT     bil hip  . TOTAL HIP ARTHROPLASTY     bil  . TOTAL HIP REVISION Right 11/15/2019   Procedure: TOTAL HIP REVISION;  Surgeon: Gaynelle Arabian, MD;  Location: [facility];  Service: Orthopedics;  Laterality: Right;  12min  . vein surgey      inner thigh left leg has a ? stent done in office 1980's    There were no vitals filed for this visit.   Subjective Assessment - 01/31/20 1147    Subjective Pt reports she was "miserable" for 5 days following last session due to soreness in LE.  She is also having trouble dealing with loss of her son.    Currently in Pain? Yes    Pain Score 2     Pain Location Leg   thigh to knee   Pain Orientation Right;Anterior    Pain Descriptors / Indicators Aching;Tightness    Aggravating Factors  prolonged sitting    Pain Relieving Factors ice              [facility] PT Assessment - 01/31/20 0001      Assessment   Medical Diagnosis R total hip     Referring Provider (PT) Hector Shade, MD    Onset Date/Surgical Date 11/15/19      Next MD Visit 02/01/20      Observation/Other Assessments   Focus on Therapeutic Outcomes (FOTO)  62% limitation      Strength   Right/Left Hip Right    Right Hip Flexion 4+/5    Right Hip Extension 3-/5    Right Hip ABduction 2+/5    Right/Left Knee Right    Right Knee Flexion 5/5    Right Knee Extension 4+/5      Flexibility   Quadriceps Rt ~75 deg, Lt 107 deg             [facility] Adult PT Treatment/Exercise - 01/31/20 0001      Knee/Hip Exercises: Stretches   Passive Hamstring Stretch Right;3 reps;20 seconds   supine with strap   Quad Stretch Right;30 seconds;4 reps   prone with strap   Quad Stretch Limitations initially limited, improved tolerance with towel above knee     Hip Flexor Stretch Right;3 reps;20 seconds   standing, knee straight, pointed toe   Other Knee/Hip Stretches prone on elbows x 20 sec x 3 reps      Knee/Hip  Exercises: Aerobic   Nustep L4: 6 min legs only.       Knee/Hip Exercises: Standing   Hip Abduction Right;1 set;10 reps    Lateral Step Up Right;1 set;Hand Hold: 2;Step Height: 4";5 reps    Forward Step Up Right;1 set;Hand Hold: 2;Step Height: 4"    Step Down Left;1 set;5 reps;Hand Hold: 2;Step Height: 4"   with retro step up.      Knee/Hip Exercises: Seated   Long Arc Quad Strengthening;Right;1 set;10 reps    Long Arc Quad Weight 4 lbs.    Marching Both;1 set;10 reps      Knee/Hip Exercises: Sidelying   Hip ABduction --   held   Clams RLE x 5 reps, pillow between knees- stopped due to increased Rt knee pain       Knee/Hip Exercises: Prone   Straight Leg Raises Strengthening;Right;Left;1 set;10 reps      Modalities   Modalities Electrical Stimulation;Moist Heat;Cryotherapy      Moist Heat Therapy   Number Minutes Moist Heat 10 Minutes    Moist Heat Location --   buttocks     Cryotherapy   Number Minutes Cryotherapy 10 Minutes    Cryotherapy Location --   ant Rt thigh   Type of Cryotherapy Ice pack      Electrical Stimulation    Electrical Stimulation Location Rt prox/distal quad    Electrical Stimulation Action IFC    Electrical Stimulation Parameters 10 min, intensity to tolerance    Electrical Stimulation Goals Pain      Manual Therapy   Soft tissue mobilization STM to Rt lateral quad and hamstring to decrease fascial restrictions.                       PT Long Term Goals - 01/31/20 1420      PT LONG TERM GOAL #1   Title The patient will be indep with HEP for LE strengthening.    Time 6    Period Weeks    Status On-going      PT LONG TERM GOAL #2   Title The patient will reduce functional limitation in FOTO from  64% limitation to < or equal to 41% limitation.    Time 6    Period Weeks    Status On-going      PT LONG TERM GOAL #3   Title The patient will ambulate without a device for household and community distances (>1000') independently.    Time 6    Period Weeks    Status On-going      PT LONG TERM GOAL #4   Title The patient will negotiate 12 steps with one handrail and a reciprocal pattern mod indep.    Time 6    Period Weeks    Status On-going      PT LONG TERM GOAL #5   Title The patient will demo hip abduction to > or equal to 4/5 strength to improve lateral hip stability.    Time 6    Period Weeks    Status On-going      PT LONG TERM GOAL #6   Title The patient will improve gait speed to > or equal to 2.8 ft/sec.    Baseline 2.3 ft/sec    Time 6    Period Weeks    Status On-going                 Plan - 01/31/20 1404    Clinical Impression  Statement Pt demonstrates continued weakness in Rt hip/knee.  She continues to have difficulty toleratinge 100% WB through RLE, requiring UE support of SPC.  Pt's Rt quad tighter than last assessment; she had difficulty tolerating quad stretch today.  Pt remains motivated to progress is making gradual gains towards LTGs.    Stability/Clinical Decision Making Stable/Uncomplicated    Rehab Potential Good    PT Frequency  2x / week    PT Duration 6 weeks    PT Treatment/Interventions ADLs/Self Care Home Management;Gait training;Stair training;Functional mobility training;Therapeutic activities;Therapeutic exercise;Patient/family education;Electrical Stimulation;Cryotherapy;Moist Heat;Manual techniques;Taping    PT Next Visit Plan progress HEP, tape for scar mgmt, LE strengthening, gait with SPC,  TENS trial.    PT Home Exercise Plan [id number]    Consulted and Agree with Plan of Care Patient           Patient will benefit from skilled therapeutic intervention in order to improve the following deficits and impairments:  Abnormal gait, Pain, Impaired flexibility, Decreased strength, Decreased range of motion, Increased fascial restricitons  Visit Diagnosis: Pain in right hip  Muscle weakness (generalized)  Other abnormalities of gait and mobility     Problem List Patient Active Problem List   Diagnosis Date Noted  . Failed total hip arthroplasty ([place]) 11/15/2019  . IRRITABLE BOWEL SYNDROME 03/16/2010  . HIATAL HERNIA 02/12/2010  . DEPRESSION 01/04/2010  . GERD 01/04/2010  . CONSTIPATION 01/04/2010  . CHEST PAIN 01/04/2010  . PERSONAL HISTORY OF ARTHRITIS 01/04/2010   Kerin Perna, PTA 01/31/20 2:20 PM  [place], [place], [address] Phone: [phone]   Fax:  [phone]  Name: Melinda Thompson MRN: [id number] Date of Birth: [birth year]

## 2020-01-31 NOTE — Patient Instructions (Signed)

## 2020-02-03 ENCOUNTER — Other Ambulatory Visit: Payer: Self-pay

## 2020-02-03 ENCOUNTER — Encounter: Payer: Self-pay | Admitting: Rehabilitative and Restorative Service Providers"

## 2020-02-03 ENCOUNTER — Ambulatory Visit (INDEPENDENT_AMBULATORY_CARE_PROVIDER_SITE_OTHER): Payer: Medicare HMO | Admitting: Rehabilitative and Restorative Service Providers"

## 2020-02-03 DIAGNOSIS — M25551 Pain in right hip: Secondary | ICD-10-CM

## 2020-02-03 DIAGNOSIS — M6281 Muscle weakness (generalized): Secondary | ICD-10-CM | POA: Diagnosis not present

## 2020-02-03 DIAGNOSIS — R2689 Other abnormalities of gait and mobility: Secondary | ICD-10-CM

## 2020-02-03 NOTE — Therapy (Signed)
[place], [place], [address] Phone: [phone]   Fax:  [phone]  Physical Therapy Treatment  Patient Details  Name: Melinda Thompson MRN: [id number] Date of Birth: 07/07/1950 Referring Provider (PT): Hector Shade, MD   Encounter Date: 02/03/2020   PT End of Session - 02/03/20 1740    Visit Number 6    Number of Visits 12    Date for PT Re-Evaluation 02/24/20    PT Start Time 8144    PT Stop Time 1230   last 5 minutes of session discussion of grieving process and upcoming therapy (son passed away on 11-Jan-2023)   PT Time Calculation (min) 43 min    Activity Tolerance Patient tolerated treatment well;No increased pain    Behavior During Therapy WFL for tasks assessed/performed           Past Medical History:  Diagnosis Date  . Anxiety   . Arthritis    osteoarthritis  . GERD (gastroesophageal reflux disease)   . Headache    migraines  . History of hiatal hernia     Past Surgical History:  Procedure Laterality Date  . JOINT REPLACEMENT     bil hip  . TOTAL HIP ARTHROPLASTY     bil  . TOTAL HIP REVISION Right 11/15/2019   Procedure: TOTAL HIP REVISION;  Surgeon: Gaynelle Arabian, MD;  Location: [facility];  Service: Orthopedics;  Laterality: Right;  160min  . vein surgey      inner thigh left leg has a ? stent done in office 1980's    There were no vitals filed for this visit.   Subjective Assessment - 02/03/20 1156    Subjective The patient reports she walked a long distance today and is stiff in her R leg today.  She is doing a lot on her feet throughout the day at home.    Patient Stated Goals I want to be able to walk without a cane.    Currently in Pain? Yes    Pain Score 2     Pain Location Leg    Pain Orientation Right;Anterior    Pain Descriptors / Indicators Aching;Tightness    Pain Type Surgical pain    Pain Onset More than a month ago    Pain Relieving Factors ice               [facility] PT Assessment - 02/03/20 1205      Assessment   Medical Diagnosis R total hip     Referring Provider (PT) Hector Shade, MD    Onset Date/Surgical Date 11/15/19                         [facility] Adult PT Treatment/Exercise - 02/03/20 1203      Ambulation/Gait   Ambulation/Gait Yes    Ambulation/Gait Assistance 6: Modified independent (Device/Increase time)    Ambulation Distance (Feet) 250 Feet    Assistive device Straight cane    Gait Pattern Decreased step length - left;Decreased stance time - right;Step-through pattern;Decreased weight shift to right      Self-Care   Self-Care Other Self-Care Comments    Other Self-Care Comments  the patient and PT discussed grief and how this impacts her rehab process.  Some days, she is not interested in completing HEP and feels indifferent to process.  She is working through emotions of grief and begins with a Ambulance person.  Exercises   Exercises Knee/Hip      Knee/Hip Exercises: Stretches   Active Hamstring Stretch Right;Left;1 rep;20 seconds    Quad Stretch Right;30 seconds    Quad Stretch Limitations followed by contract/relax      Knee/Hip Exercises: Aerobic   Nustep L4 x 4 minutes legs only      Knee/Hip Exercises: Seated   Long Arc Quad Strengthening;Right;10 reps    Other Seated Knee/Hip Exercises seated ankle pumps wiith knee extended      Knee/Hip Exercises: Supine   Bridges with [employer];Both   12 reps   Straight Leg Raise with External Rotation Strengthening;Right    Straight Leg Raise with External Rotation Limitations unable to tolerate due to distal medial knee pain; attempted SLR with foot neutral without pain    Other Supine Knee/Hip Exercises hip adductor isometrics with supine ball squeeze x 12 reps x 5 second holds    Other Supine Knee/Hip Exercises hip extension into a ball x 5 second holds x 12 reps      Knee/Hip Exercises: Sidelying   Hip ABduction  Strengthening   12 reps from pillow height lift   Clams R LE x 8 reps    Other Sidelying Knee/Hip Exercises Foot supported on the ball and performing hip hike and depression      Knee/Hip Exercises: Prone   Hamstring Curl 10 reps    Straight Leg Raises Strengthening;Right;5 reps    Straight Leg Raises Limitations then with knee flexed x 5 reps    Other Prone Exercises prone quad set x 12 reps      Manual Therapy   Manual Therapy Soft tissue mobilization    Manual therapy comments to reduce lateral knee discomfort    Soft tissue mobilization STM to R ITB, VL, and lateral HS                       PT Long Term Goals - 01/31/20 1420      PT LONG TERM GOAL #1   Title The patient will be indep with HEP for LE strengthening.    Time 6    Period Weeks    Status On-going      PT LONG TERM GOAL #2   Title The patient will reduce functional limitation in FOTO from  64% limitation to < or equal to 41% limitation.    Time 6    Period Weeks    Status On-going      PT LONG TERM GOAL #3   Title The patient will ambulate without a device for household and community distances (>1000') independently.    Time 6    Period Weeks    Status On-going      PT LONG TERM GOAL #4   Title The patient will negotiate 12 steps with one handrail and a reciprocal pattern mod indep.    Time 6    Period Weeks    Status On-going      PT LONG TERM GOAL #5   Title The patient will demo hip abduction to > or equal to 4/5 strength to improve lateral hip stability.    Time 6    Period Weeks    Status On-going      PT LONG TERM GOAL #6   Title The patient will improve gait speed to > or equal to 2.8 ft/sec.    Baseline 2.3 ft/sec    Time 6    Period Weeks  Status On-going                 Plan - 02/03/20 1244    Clinical Impression Statement The patient has most discomfort noted at lateral knee with tightness palpated in distal IT Band (VL and lateral HS).  PT continuing to  progress to patient tolerance emphasizing normalizing gait, improving strength.  PT and patient discussed upcoming therapy appointment with counselor after son's death in January 31, 2023.    Stability/Clinical Decision Making Stable/Uncomplicated    Rehab Potential Good    PT Frequency 2x / week    PT Duration 6 weeks    PT Treatment/Interventions ADLs/Self Care Home Management;Gait training;Stair training;Functional mobility training;Therapeutic activities;Therapeutic exercise;Patient/family education;Electrical Stimulation;Cryotherapy;Moist Heat;Manual techniques;Taping    PT Next Visit Plan progress HEP, tape for scar mgmt, LE strengthening, gait with SPC,  TENS trial.    PT Home Exercise Plan [id number]    Consulted and Agree with Plan of Care Patient           Patient will benefit from skilled therapeutic intervention in order to improve the following deficits and impairments:  Abnormal gait, Pain, Impaired flexibility, Decreased strength, Decreased range of motion, Increased fascial restricitons  Visit Diagnosis: Pain in right hip  Muscle weakness (generalized)  Other abnormalities of gait and mobility     Problem List Patient Active Problem List   Diagnosis Date Noted  . Failed total hip arthroplasty ([place]) 11/15/2019  . IRRITABLE BOWEL SYNDROME 03/16/2010  . HIATAL HERNIA 02/12/2010  . DEPRESSION 01/04/2010  . GERD 01/04/2010  . CONSTIPATION 01/04/2010  . CHEST PAIN 01/04/2010  . PERSONAL HISTORY OF ARTHRITIS 01/04/2010    Anyiah Coverdale, PT 02/03/2020, 12:50 PM  [facility] [address] [place], [place], [address] Phone: [phone]   Fax:  [phone]  Name: AYNSLEY FLEET MRN: [id number] Date of Birth: 24-Jan-1951

## 2020-02-04 DIAGNOSIS — R69 Illness, unspecified: Secondary | ICD-10-CM | POA: Diagnosis not present

## 2020-02-09 ENCOUNTER — Ambulatory Visit: Payer: Medicare HMO | Admitting: Physical Therapy

## 2020-02-09 ENCOUNTER — Other Ambulatory Visit: Payer: Self-pay

## 2020-02-09 DIAGNOSIS — M6281 Muscle weakness (generalized): Secondary | ICD-10-CM | POA: Diagnosis not present

## 2020-02-09 DIAGNOSIS — M25551 Pain in right hip: Secondary | ICD-10-CM | POA: Diagnosis not present

## 2020-02-09 DIAGNOSIS — R2689 Other abnormalities of gait and mobility: Secondary | ICD-10-CM

## 2020-02-09 NOTE — Therapy (Signed)
[place], [place], [address] Phone: [phone]   Fax:  [phone]  Physical Therapy Treatment  Patient Details  Name: Melinda Thompson MRN: [id number] Date of Birth: March 16, 1951 Referring Provider (PT): Hector Shade, MD   Encounter Date: 02/09/2020   PT End of Session - 02/09/20 0850    Visit Number 7    Number of Visits 12    Date for PT Re-Evaluation 02/24/20    PT Start Time 0160    PT Stop Time 0930    PT Time Calculation (min) 43 min           Past Medical History:  Diagnosis Date  . Anxiety   . Arthritis    osteoarthritis  . GERD (gastroesophageal reflux disease)   . Headache    migraines  . History of hiatal hernia     Past Surgical History:  Procedure Laterality Date  . JOINT REPLACEMENT     bil hip  . TOTAL HIP ARTHROPLASTY     bil  . TOTAL HIP REVISION Right 11/15/2019   Procedure: TOTAL HIP REVISION;  Surgeon: Gaynelle Arabian, MD;  Location: [facility];  Service: Orthopedics;  Laterality: Right;  162min  . vein surgey      inner thigh left leg has a ? stent done in office 1980's    There were no vitals filed for this visit.   Subjective Assessment - 02/09/20 0852    Subjective Pt reports she has joined [facility] and has been once. She has been doing SLRs and stretching quad, as well as walking in her neighborhood (with [facility]).    Patient Stated Goals I want to be able to walk without a cane.    Currently in Pain? Yes    Pain Score 1     Pain Location Leg    Pain Orientation Lateral;Right    Pain Descriptors / Indicators Tightness;Aching    Pain Onset More than a month ago    Aggravating Factors  prolonged sitting/ walking    Pain Relieving Factors ice              [facility] PT Assessment - 02/09/20 0001      Assessment   Medical Diagnosis R total hip     Referring Provider (PT) Hector Shade, MD    Onset Date/Surgical Date 11/15/19    Next MD Visit to be scheduled (3 mo)       Flexibility   Quadriceps Rt knee 82 deg (prone quad stretch)      Ambulation/Gait   Ambulation Distance (Feet) 120 Feet   at counter, 10 ft reps forward/ retro   Assistive device Other (Comment)   light LUE support on counter   Gait Pattern Step-through pattern;Decreased stance time - right;Decreased hip/knee flexion - right;Decreased dorsiflexion - right;Wide base of support;Poor foot clearance - left;Lateral trunk lean to left           [facility] Adult PT Treatment/Exercise - 02/09/20 0001      Knee/Hip Exercises: Stretches   Sports administrator Right;3 reps;30 seconds   prone with noodle under knee   Gastroc Stretch Right;Left;1 rep;30 seconds   runners stretch     Knee/Hip Exercises: Aerobic   Nustep L4 x 4 minutes legs only, L5 x 1 min      Knee/Hip Exercises: Standing   Heel Raises Both;10 reps   and toe raises   Hip Flexion Stengthening;Both;1 set;10 reps   marching, UE support on rail.  Hip Abduction Stengthening;Right;Left;1 set;10 reps   UE support on rail   Hip Extension Stengthening;Right;Left;1 set;10 reps;Knee straight   toe on ground, UE support on rail   Stairs 10 stairs, reciprocal pattern, 6" step - bilat and unilateral UE support    Other Standing Knee Exercises mini side to side lunge x 12 reps.                        PT Long Term Goals - 01/31/20 1420      PT LONG TERM GOAL #1   Title The patient will be indep with HEP for LE strengthening.    Time 6    Period Weeks    Status On-going      PT LONG TERM GOAL #2   Title The patient will reduce functional limitation in FOTO from  64% limitation to < or equal to 41% limitation.    Time 6    Period Weeks    Status On-going      PT LONG TERM GOAL #3   Title The patient will ambulate without a device for household and community distances (>1000') independently.    Time 6    Period Weeks    Status On-going      PT LONG TERM GOAL #4   Title The patient will negotiate 12 steps with one handrail  and a reciprocal pattern mod indep.    Time 6    Period Weeks    Status On-going      PT LONG TERM GOAL #5   Title The patient will demo hip abduction to > or equal to 4/5 strength to improve lateral hip stability.    Time 6    Period Weeks    Status On-going      PT LONG TERM GOAL #6   Title The patient will improve gait speed to > or equal to 2.8 ft/sec.    Baseline 2.3 ft/sec    Time 6    Period Weeks    Status On-going                 Plan - 02/09/20 0859    Clinical Impression Statement Pt continues to have difficulty bearing 100% wt into RLE. She complained of tightness in Rt calf during exercises. Gait quality improving with focus on cues for weight shift and less lateral lean.  Much improved ability to ascend steps with RLE.  Progressing towards goals.    Stability/Clinical Decision Making Stable/Uncomplicated    Rehab Potential Good    PT Frequency 2x / week    PT Duration 6 weeks    PT Treatment/Interventions ADLs/Self Care Home Management;Gait training;Stair training;Functional mobility training;Therapeutic activities;Therapeutic exercise;Patient/family education;Electrical Stimulation;Cryotherapy;Moist Heat;Manual techniques;Taping    PT Next Visit Plan progress HEP, tape for scar mgmt, LE strengthening, gait with SPC,  TENS trial.    PT Home Exercise Plan [id number]    Consulted and Agree with Plan of Care Patient           Patient will benefit from skilled therapeutic intervention in order to improve the following deficits and impairments:  Abnormal gait, Pain, Impaired flexibility, Decreased strength, Decreased range of motion, Increased fascial restricitons  Visit Diagnosis: Pain in right hip  Muscle weakness (generalized)  Other abnormalities of gait and mobility     Problem List Patient Active Problem List   Diagnosis Date Noted  . Failed total hip arthroplasty ([place]) 11/15/2019  . IRRITABLE BOWEL SYNDROME 03/16/2010  .  HIATAL HERNIA  02/12/2010  . DEPRESSION 01/04/2010  . GERD 01/04/2010  . CONSTIPATION 01/04/2010  . CHEST PAIN 01/04/2010  . PERSONAL HISTORY OF ARTHRITIS 01/04/2010   Kerin Perna, PTA 02/09/20 1:15 PM  [facility] [place], [place], [address] Phone: [phone]   Fax:  [phone]  Name: MARYKATE HEUBERGER MRN: [id number] Date of Birth: 1951-05-14

## 2020-02-14 ENCOUNTER — Ambulatory Visit: Payer: Medicare HMO | Admitting: Physical Therapy

## 2020-02-14 ENCOUNTER — Other Ambulatory Visit: Payer: Self-pay

## 2020-02-14 ENCOUNTER — Encounter: Payer: Self-pay | Admitting: Physical Therapy

## 2020-02-14 DIAGNOSIS — R2689 Other abnormalities of gait and mobility: Secondary | ICD-10-CM | POA: Diagnosis not present

## 2020-02-14 DIAGNOSIS — M6281 Muscle weakness (generalized): Secondary | ICD-10-CM

## 2020-02-14 DIAGNOSIS — M25551 Pain in right hip: Secondary | ICD-10-CM

## 2020-02-14 NOTE — Therapy (Signed)
[place], [place], [address] Phone: [phone]   Fax:  [phone]  Physical Therapy Treatment  Patient Details  Name: Melinda Thompson MRN: [id number] Date of Birth: 17-Jun-1951 Referring Provider (PT): Hector Shade, MD   Encounter Date: 02/14/2020   PT End of Session - 02/14/20 1428    Visit Number 8    Number of Visits 12    Date for PT Re-Evaluation 02/24/20    PT Start Time 3295    PT Stop Time 1438    PT Time Calculation (min) 51 min    Activity Tolerance Patient tolerated treatment well    Behavior During Therapy [facility] for tasks assessed/performed           Past Medical History:  Diagnosis Date  . Anxiety   . Arthritis    osteoarthritis  . GERD (gastroesophageal reflux disease)   . Headache    migraines  . History of hiatal hernia     Past Surgical History:  Procedure Laterality Date  . JOINT REPLACEMENT     bil hip  . TOTAL HIP ARTHROPLASTY     bil  . TOTAL HIP REVISION Right 11/15/2019   Procedure: TOTAL HIP REVISION;  Surgeon: Gaynelle Arabian, MD;  Location: [facility];  Service: Orthopedics;  Laterality: Right;  19min  . vein surgey      inner thigh left leg has a ? stent done in office 1980's    There were no vitals filed for this visit.   Subjective Assessment - 02/14/20 1350    Subjective Pt reports she is now ascending stairs with reciprocal pattern "wthout even thinking about it".  She is still walking with SPC. She [facility].   Currently in Pain? Yes    Pain Score 3     Pain Location Hip    Pain Orientation Right;Posterior    Pain Descriptors / Indicators Sore;Aching;Tightness              [facility] PT Assessment - 02/14/20 0001      Assessment   Medical Diagnosis R total hip     Referring Provider (PT) Hector Shade, MD    Onset Date/Surgical Date 11/15/19    Next MD Visit to be scheduled (3 mo)      AROM   Right Hip ABduction 29     Left Hip ABduction 32   supine     Strength   Right Hip Flexion 5/5    Right Hip Extension 3-/5    Right Hip ABduction 3-/5    Left Hip Flexion 5/5            [facility] Adult PT Treatment/Exercise - 02/14/20 0001      Ambulation/Gait   Ambulation Distance (Feet) 120 Feet   at counter, 10 ft reps forward/ retro   Assistive device Other (Comment)   light intermittent LUE support on counter   Gait Pattern Step-through pattern;Decreased stance time - right;Decreased hip/knee flexion - right;Decreased dorsiflexion - right;Wide base of support;Poor foot clearance - left;Lateral trunk lean to left    Gait Comments some tactile cues to improve reciprocal arm swing during some of the trial.  VC to increase Rt step length with retro gait and to increase weight shift Rt with forward gait.      Knee/Hip Exercises: Stretches   Passive Hamstring Stretch Right;2 reps;30 seconds    Quad Stretch Right;3 reps;30 seconds   prone with noodle  under knee     Knee/Hip Exercises: Aerobic   Nustep L5 x 5 minutes arms/legs      Knee/Hip Exercises: Standing   Hip Flexion Stengthening;Both;1 set;10 reps   marching, UE support on rail.    Side Lunges Right;Left;1 set;10 reps   leaning forward on counter- side to side.    Hip Abduction Stengthening;Right;Left;2 sets;5 reps   UE support on rail   Forward Step Up Right;1 set;10 reps;Hand Hold: 2;Step Height: 6"      Knee/Hip Exercises: Supine   Bridges Strengthening;1 set;10 reps      Knee/Hip Exercises: Sidelying   Hip ABduction Strengthening;Right;1 set;10 reps    Hip ABduction Limitations limited lift from pillow    Clams R LE x 8 reps      Knee/Hip Exercises: Prone   Straight Leg Raises Strengthening;Right;Left;1 set      Cryotherapy   Number Minutes Cryotherapy 10 Minutes    Cryotherapy Location --   ant Rt thigh   Type of Cryotherapy Ice pack      Electrical Stimulation   Electrical Stimulation Location Rt prox/distal quad    Electrical  Stimulation Action IFC    Electrical Stimulation Parameters 12 min, intensity to tolerance     Electrical Stimulation Goals Pain                       PT Long Term Goals - 01/31/20 1420      PT LONG TERM GOAL #1   Title The patient will be indep with HEP for LE strengthening.    Time 6    Period Weeks    Status On-going      PT LONG TERM GOAL #2   Title The patient will reduce functional limitation in FOTO from  64% limitation to < or equal to 41% limitation.    Time 6    Period Weeks    Status On-going      PT LONG TERM GOAL #3   Title The patient will ambulate without a device for household and community distances (>1000') independently.    Time 6    Period Weeks    Status On-going      PT LONG TERM GOAL #4   Title The patient will negotiate 12 steps with one handrail and a reciprocal pattern mod indep.    Time 6    Period Weeks    Status On-going      PT LONG TERM GOAL #5   Title The patient will demo hip abduction to > or equal to 4/5 strength to improve lateral hip stability.    Time 6    Period Weeks    Status On-going      PT LONG TERM GOAL #6   Title The patient will improve gait speed to > or equal to 2.8 ft/sec.    Baseline 2.3 ft/sec    Time 6    Period Weeks    Status On-going                 Plan - 02/14/20 1533    Clinical Impression Statement Improved ability to ascend/descend 6" stairs with less fatigue.  Pt continues with weakness in Rt hip abductors and fatigues quickly with single leg strengthening exercises in standing. Pt continues to require SPC with gait; pt continues to have increased gait deviations such as shortened Lt step length and decreased weight shift Rt during short trials at counter with internittent UE support.  Rt  quad flexibility gradually improving. Pt has made gradual progress towards goals.    Stability/Clinical Decision Making Stable/Uncomplicated    Rehab Potential Good    PT Frequency 2x / week    PT  Duration 6 weeks    PT Treatment/Interventions ADLs/Self Care Home Management;Gait training;Stair training;Functional mobility training;Therapeutic activities;Therapeutic exercise;Patient/family education;Electrical Stimulation;Cryotherapy;Moist Heat;Manual techniques;Taping    PT Next Visit Plan assess gait speed and other goals.  continue gait training and LE strengthening while maintaining post hip precautions.    PT Home Exercise Plan [id number]    Consulted and Agree with Plan of Care Patient           Patient will benefit from skilled therapeutic intervention in order to improve the following deficits and impairments:  Abnormal gait, Pain, Impaired flexibility, Decreased strength, Decreased range of motion, Increased fascial restricitons  Visit Diagnosis: Pain in right hip  Muscle weakness (generalized)  Other abnormalities of gait and mobility     Problem List Patient Active Problem List   Diagnosis Date Noted  . Failed total hip arthroplasty ([place]) 11/15/2019  . IRRITABLE BOWEL SYNDROME 03/16/2010  . HIATAL HERNIA 02/12/2010  . DEPRESSION 01/04/2010  . GERD 01/04/2010  . CONSTIPATION 01/04/2010  . CHEST PAIN 01/04/2010  . PERSONAL HISTORY OF ARTHRITIS 01/04/2010   Kerin Perna, PTA 02/14/20 3:47 PM  [place], [place], [address] Phone: [phone]   Fax:  [phone]  Name: Melinda Thompson MRN: [id number] Date of Birth: 1950/11/17

## 2020-02-16 ENCOUNTER — Encounter: Payer: Medicare HMO | Admitting: Physical Therapy

## 2020-02-16 ENCOUNTER — Telehealth: Payer: Self-pay | Admitting: Physical Therapy

## 2020-02-16 NOTE — Telephone Encounter (Signed)
Patient did not show for PT appt.  Called and left message for patient to return phone call to [facility] at [phone].    Kerin Perna, PTA 02/16/20 10:31 AM

## 2020-02-17 DIAGNOSIS — R69 Illness, unspecified: Secondary | ICD-10-CM | POA: Diagnosis not present

## 2020-02-18 ENCOUNTER — Ambulatory Visit: Payer: Medicare HMO | Admitting: Physical Therapy

## 2020-02-18 ENCOUNTER — Other Ambulatory Visit: Payer: Self-pay

## 2020-02-18 DIAGNOSIS — R2689 Other abnormalities of gait and mobility: Secondary | ICD-10-CM

## 2020-02-18 DIAGNOSIS — M6281 Muscle weakness (generalized): Secondary | ICD-10-CM

## 2020-02-18 DIAGNOSIS — M25551 Pain in right hip: Secondary | ICD-10-CM | POA: Diagnosis not present

## 2020-02-18 NOTE — Therapy (Signed)
[place], [place], [address] Phone: [phone]   Fax:  [phone]  Physical Therapy Treatment  Patient Details  Name: Melinda Thompson MRN: [id number] Date of Birth: 1951-05-14 Referring Provider (PT): Hector Shade, MD   Encounter Date: 02/18/2020   PT End of Session - 02/18/20 1057    Visit Number 9    Number of Visits 12    Date for PT Re-Evaluation 02/24/20    PT Start Time 6203    PT Stop Time 1058    PT Time Calculation (min) 43 min    Activity Tolerance Patient tolerated treatment well    Behavior During Therapy [facility] for tasks assessed/performed           Past Medical History:  Diagnosis Date  . Anxiety   . Arthritis    osteoarthritis  . GERD (gastroesophageal reflux disease)   . Headache    migraines  . History of hiatal hernia     Past Surgical History:  Procedure Laterality Date  . JOINT REPLACEMENT     bil hip  . TOTAL HIP ARTHROPLASTY     bil  . TOTAL HIP REVISION Right 11/15/2019   Procedure: TOTAL HIP REVISION;  Surgeon: Gaynelle Arabian, MD;  Location: [facility];  Service: Orthopedics;  Laterality: Right;  119min  . vein surgey      inner thigh left leg has a ? stent done in office 1980's    There were no vitals filed for this visit.   Subjective Assessment - 02/18/20 1035    Subjective Pt reports she has been working on stairs and gait with arm swing. She states her friend has noticed that she is able to complete stairs easier.  The tape is still in place on thigh.    Patient Stated Goals I want to be able to walk without a cane.    Currently in Pain? No/denies    Pain Score 0-No pain   just tightness in thigh             [facility] PT Assessment - 02/18/20 0001      Assessment   Medical Diagnosis R total hip     Referring Provider (PT) Hector Shade, MD    Onset Date/Surgical Date 11/15/19    Next MD Visit to be scheduled (3 mo)      Strength   Right Hip  ABduction 3+/5      Flexibility   Quadriceps Rt knee 104 deg             [facility] Adult PT Treatment/Exercise - 02/18/20 0001      Ambulation/Gait   Ambulation Distance (Feet) 80 Feet   at counter, 10 ft reps forward/ retro   Assistive device Other (Comment)   intermittent LUE support on counter   Gait Pattern Step-through pattern;Decreased stance time - right;Decreased hip/knee flexion - right;Decreased dorsiflexion - right;Wide base of support;Lateral trunk lean to left    Gait Comments minor tactile cues to improve reciprocal arm swing during forward gait.  VC to increase Rt step length with retro gait and to increase weight shift Rt with forward gait.      Self-Care   Other Self-Care Comments  reviewed self massage with roller stick, utilizing cane as roller to Rt thigh to decrease fascial tightness and pain; pt returned demo with cues.       Knee/Hip Exercises: Stretches   Sports administrator Right;3 reps;30 seconds   prone with strap  Gastroc Stretch Both;2 reps;30 seconds      Knee/Hip Exercises: Aerobic   Nustep L5 x 5 minutes arms/legs      Knee/Hip Exercises: Standing   Stairs 20 steps with reciprocal pattern (8 with bilat UE on rail, 12 with LUE on rail only)     Other Standing Knee Exercises weight shifts side to side and front to back, attempting Rt SLS x 8 reps each, with CGA for safety.       Knee/Hip Exercises: Supine   Bridges 10 reps   5 sec hold   Straight Leg Raises Strengthening;Right;2 sets;5 reps   5 sec hold     Knee/Hip Exercises: Prone   Hip Extension Strengthening;Right;Left;1 set;10 reps   knee flexed    Straight Leg Raises Strengthening;Right;1 set;10 reps      Manual Therapy   Manual therapy comments I strip of reg [place] tape applied to Rt lateral thigh incision to assist with scar management                       PT Long Term Goals - 02/18/20 1725      PT LONG TERM GOAL #1   Title The patient will be indep with HEP for LE strengthening.     Time 6    Period Weeks    Status On-going      PT LONG TERM GOAL #2   Title The patient will reduce functional limitation in FOTO from  64% limitation to < or equal to 41% limitation.    Time 6    Period Weeks    Status On-going      PT LONG TERM GOAL #3   Title The patient will ambulate without a device for household and community distances (>1000') independently.    Time 6    Period Weeks    Status On-going      PT LONG TERM GOAL #4   Title The patient will negotiate 12 steps with one handrail and a reciprocal pattern mod indep.    Time 6    Period Weeks    Status Achieved      PT LONG TERM GOAL #5   Title The patient will demo hip abduction to > or equal to 4/5 strength to improve lateral hip stability.    Time 6    Period Weeks    Status On-going      PT LONG TERM GOAL #6   Title The patient will improve gait speed to > or equal to 2.8 ft/sec.    Baseline 2.3 ft/sec    Time 6    Period Weeks    Status On-going                 Plan - 02/18/20 1726    Clinical Impression Statement Pt demonstrating improved ability to ascend/descend steps; has met LTG#4.  Rt hip abdct has improved to 3+/5.  Pt unable to stand on Rt leg with SLS (unsupported) greater than 1 sec without LOB.  Pt's quad flexibility significantly improved from last assessment.  Progressing well towards goals. Pt remains motivated to progress.    Stability/Clinical Decision Making Stable/Uncomplicated    Rehab Potential Good    PT Frequency 2x / week    PT Duration 6 weeks    PT Treatment/Interventions ADLs/Self Care Home Management;Gait training;Stair training;Functional mobility training;Therapeutic activities;Therapeutic exercise;Patient/family education;Electrical Stimulation;Cryotherapy;Moist Heat;Manual techniques;Taping    PT Next Visit Plan assess gait speed. continue gait training and LE  strengthening while maintaining post hip precautions.    PT Home Exercise Plan [id number]    Consulted  and Agree with Plan of Care Patient           Patient will benefit from skilled therapeutic intervention in order to improve the following deficits and impairments:  Abnormal gait, Pain, Impaired flexibility, Decreased strength, Decreased range of motion, Increased fascial restricitons  Visit Diagnosis: Pain in right hip  Muscle weakness (generalized)  Other abnormalities of gait and mobility     Problem List Patient Active Problem List   Diagnosis Date Noted  . Failed total hip arthroplasty ([place]) 11/15/2019  . IRRITABLE BOWEL SYNDROME 03/16/2010  . HIATAL HERNIA 02/12/2010  . DEPRESSION 01/04/2010  . GERD 01/04/2010  . CONSTIPATION 01/04/2010  . CHEST PAIN 01/04/2010  . PERSONAL HISTORY OF ARTHRITIS 01/04/2010   Kerin Perna, PTA 02/18/20 5:31 PM  [place], [place], [address] Phone: [phone]   Fax:  [phone]  Name: OTHELLO SGROI MRN: [id number] Date of Birth: 1951-03-24

## 2020-02-21 ENCOUNTER — Encounter: Payer: Medicare HMO | Admitting: Rehabilitative and Restorative Service Providers"

## 2020-02-23 ENCOUNTER — Encounter: Payer: Self-pay | Admitting: Rehabilitative and Restorative Service Providers"

## 2020-02-23 ENCOUNTER — Ambulatory Visit (INDEPENDENT_AMBULATORY_CARE_PROVIDER_SITE_OTHER): Payer: Medicare HMO | Admitting: Rehabilitative and Restorative Service Providers"

## 2020-02-23 ENCOUNTER — Other Ambulatory Visit: Payer: Self-pay

## 2020-02-23 DIAGNOSIS — M25551 Pain in right hip: Secondary | ICD-10-CM

## 2020-02-23 DIAGNOSIS — R2689 Other abnormalities of gait and mobility: Secondary | ICD-10-CM

## 2020-02-23 DIAGNOSIS — M6281 Muscle weakness (generalized): Secondary | ICD-10-CM

## 2020-02-23 NOTE — Therapy (Signed)
[place], [place], [address] Phone: [phone]   Fax:  [phone]  Physical Therapy Treatment and Renewal Summary  Patient Details  Name: Melinda Thompson MRN: [id number] Date of Birth: 1951/03/13 Referring Provider (PT): Hector Shade, MD   Encounter Date: 02/23/2020   PT End of Session - 02/23/20 1056    Visit Number 10    Number of Visits 12    Date for PT Re-Evaluation 02/24/20    PT Start Time 1059    PT Stop Time 1140    PT Time Calculation (min) 41 min    Activity Tolerance Patient tolerated treatment well    Behavior During Therapy [facility] for tasks assessed/performed           Past Medical History:  Diagnosis Date  . Anxiety   . Arthritis    osteoarthritis  . GERD (gastroesophageal reflux disease)   . Headache    migraines  . History of hiatal hernia     Past Surgical History:  Procedure Laterality Date  . JOINT REPLACEMENT     bil hip  . TOTAL HIP ARTHROPLASTY     bil  . TOTAL HIP REVISION Right 11/15/2019   Procedure: TOTAL HIP REVISION;  Surgeon: Gaynelle Arabian, MD;  Location: [facility];  Service: Orthopedics;  Laterality: Right;  159mn  . vein surgey      inner thigh left leg has a ? stent done in office 1980's    There were no vitals filed for this visit.   Subjective Assessment - 02/23/20 1055    Subjective The patient reports she feels ready to get rid of the cane.  She is normally very active and is still limited, "but way better than it was."  She is able to vacuum and clean + laundry and is doing better with stair negotiations.    Patient Stated Goals I want to be able to walk without a cane.    Currently in Pain? No/denies              [facility] - [facility]      Assessment   Medical Diagnosis R total hip     Referring Provider (PT) FHector Shade MD    Onset Date/Surgical Date 11/15/19                         [facility]  Treatment/Exercise - 02/23/20 1101      Ambulation/Gait   Ambulation/Gait Yes    Ambulation Distance (Feet) 150 Feet    Assistive device None;Straight cane    Gait Pattern Step-through pattern;Antalgic    Gait velocity 2.98 ft/sec    Gait Comments cues for anterior hip translation during stance phase R LE of gait      Neuro Re-ed    Neuro Re-ed Details  Standing weight shifting working on load into the R LE with feet in stride position.      Exercises   Exercises Knee/Hip      Knee/Hip Exercises: Stretches   Active Hamstring Stretch Right;Left;1 rep;30 seconds    Quad Stretch Right;3 reps;30 seconds    Hip Flexor Stretch Right;3 reps;30 seconds    Hip Flexor Stretch Limitations with passive overpressure    Gastroc Stretch Right;Left;2 reps;30 seconds      Knee/Hip Exercises: Aerobic   Nustep L5 x 3 minutes      Knee/Hip Exercises: Standing   Lateral Step Up Right;10  reps    Lateral Step Up Limitations on 1" foam square    Functional Squat 1 set;10 reps    Stairs 20 steps with reciprocal pattern (8 with bilat UE on rail, 12 with LUE on rail only)       Knee/Hip Exercises: Sidelying   Hip ABduction Strengthening;Right;10 reps    Hip ABduction Limitations then performed abduction with forward and extension flexion      Knee/Hip Exercises: Prone   Hamstring Curl 10 reps    Other Prone Exercises prone quad set; contract/relax end range for knee flexion                        PT Long Term Goals - 02/23/20 1057      PT LONG TERM GOAL #1   Title The patient will be indep with HEP for LE strengthening.    Time 6    Period Weeks    Status On-going      PT LONG TERM GOAL #2   Title The patient will reduce functional limitation in FOTO from  64% limitation to < or equal to 41% limitation.    Baseline 41% limitation on 02/23/20    Time 6    Period Weeks    Status Achieved      PT LONG TERM GOAL #3   Title The patient will ambulate without a device for  household and community distances (>1000') independently.    Time 6    Period Weeks    Status On-going      PT LONG TERM GOAL #4   Title The patient will negotiate 12 steps with one handrail and a reciprocal pattern mod indep.    Time 6    Period Weeks    Status Achieved      PT LONG TERM GOAL #5   Title The patient will demo hip abduction to > or equal to 4/5 strength to improve lateral hip stability.    Time 6    Period Weeks    Status On-going      PT LONG TERM GOAL #6   Title The patient will improve gait speed to > or equal to 2.8 ft/sec.    Baseline 2.3 ft/sec improved up to 2.96 ft/sec    Time 6    Period Weeks    Status Achieved            UPDATED LONG TERM GOALS:     PT Long Term Goals - 02/23/20 1314      PT LONG TERM GOAL #1   Title The patient will be indep with HEP for LE strengthening.    Time 6    Period Weeks    Status Revised    Target Date 04/05/20      PT LONG TERM GOAL #2   Title The patient will demo hip abduction to > or equal to 4/5 strength to improve lateral hip stability.    Time 6    Period Weeks    Status Revised    Target Date 04/05/20      PT LONG TERM GOAL #3   Title The patient will ambulate without a device for household and community distances (>1000') independently.    Time 6    Period Weeks    Status Revised    Target Date 04/05/20              Plan - 02/23/20 1140    Clinical Impression Statement The  patient has partially met LTGs.  She reports she has not been able to do R single limb stance in years.  She wants to be able walk without device, improve her confidence with mobility, reduce tightness, and improve strength.  PT to continue working on updated LTGs.    Stability/Clinical Decision Making Stable/Uncomplicated    Rehab Potential Good    PT Frequency 1x / week    PT Duration 6 weeks    PT Treatment/Interventions ADLs/Self Care Home Management;Gait training;Stair training;Functional mobility  training;Therapeutic activities;Therapeutic exercise;Patient/family education;Electrical Stimulation;Cryotherapy;Moist Heat;Manual techniques;Taping    PT Next Visit Plan modified to 1x/week,  continue gait training and LE strengthening while maintaining post hip precautions.    PT Home Exercise Plan [id number]    Consulted and Agree with Plan of Care Patient            Patient will benefit from skilled therapeutic intervention in order to improve the following deficits and impairments:  Abnormal gait, Pain, Impaired flexibility, Decreased strength, Decreased range of motion, Increased fascial restricitons  Visit Diagnosis: Pain in right hip  Muscle weakness (generalized)  Other abnormalities of gait and mobility     Problem List Patient Active Problem List   Diagnosis Date Noted  . Failed total hip arthroplasty ([place]) 11/15/2019  . IRRITABLE BOWEL SYNDROME 03/16/2010  . HIATAL HERNIA 02/12/2010  . DEPRESSION 01/04/2010  . GERD 01/04/2010  . CONSTIPATION 01/04/2010  . CHEST PAIN 01/04/2010  . PERSONAL HISTORY OF ARTHRITIS 01/04/2010    Rudell Cobb , PT 02/23/2020, 1:13 PM  [facility] [place], [place], [address] Phone: [phone]   Fax:  [phone]  Name: KAITLYN FRANKO MRN: [id number] Date of Birth: Jan 24, 1951

## 2020-02-24 DIAGNOSIS — R69 Illness, unspecified: Secondary | ICD-10-CM | POA: Diagnosis not present

## 2020-02-28 ENCOUNTER — Ambulatory Visit: Payer: Medicare HMO | Admitting: Physical Therapy

## 2020-02-28 ENCOUNTER — Encounter: Payer: Self-pay | Admitting: Physical Therapy

## 2020-02-28 ENCOUNTER — Other Ambulatory Visit: Payer: Self-pay

## 2020-02-28 DIAGNOSIS — M25551 Pain in right hip: Secondary | ICD-10-CM

## 2020-02-28 DIAGNOSIS — M6281 Muscle weakness (generalized): Secondary | ICD-10-CM | POA: Diagnosis not present

## 2020-02-28 DIAGNOSIS — R2689 Other abnormalities of gait and mobility: Secondary | ICD-10-CM

## 2020-02-28 DIAGNOSIS — R69 Illness, unspecified: Secondary | ICD-10-CM | POA: Diagnosis not present

## 2020-02-28 NOTE — Therapy (Signed)
[place] [facility] [place], [place], [address] Phone: [phone]   Fax:  [phone]  Physical Therapy Treatment  Patient Details  Name: Melinda Thompson MRN: [id number] Date of Birth: July 27, 1950 Referring Provider (PT): Hector Shade, MD   Encounter Date: 02/28/2020   PT End of Session - 02/28/20 1520    Visit Number 11    Number of Visits 12    PT Start Time 3220    PT Stop Time 1555    PT Time Calculation (min) 38 min    Activity Tolerance Patient tolerated treatment well    Behavior During Therapy [facility] for tasks assessed/performed           Past Medical History:  Diagnosis Date  . Anxiety   . Arthritis    osteoarthritis  . GERD (gastroesophageal reflux disease)   . Headache    migraines  . History of hiatal hernia     Past Surgical History:  Procedure Laterality Date  . JOINT REPLACEMENT     bil hip  . TOTAL HIP ARTHROPLASTY     bil  . TOTAL HIP REVISION Right 11/15/2019   Procedure: TOTAL HIP REVISION;  Surgeon: Gaynelle Arabian, MD;  Location: [facility];  Service: Orthopedics;  Laterality: Right;  142min  . vein surgey      inner thigh left leg has a ? stent done in office 1980's    There were no vitals filed for this visit.   Subjective Assessment - 02/28/20 1520    Subjective Pt reports the bending of her Rt knee "overwhelmed the whole area" after last session; she finally took pain pill and muscle relaxer to resolve. She has been working on walking down her hallway, marching, light UE on walls.   She is pleased with her progress so far.    Patient Stated Goals I want to be able to walk without a cane.    Currently in Pain? No/denies    Pain Score 0-No pain              [facility] PT Assessment - 02/28/20 0001      Assessment   Medical Diagnosis R total hip     Referring Provider (PT) Hector Shade, MD    Onset Date/Surgical Date 11/15/19    Next MD Visit to be scheduled.       Strength     Right Hip ABduction 3+/5      Flexibility   Quadriceps Rt knee 101 deg            [facility] Adult PT Treatment/Exercise - 02/28/20 0001      Ambulation/Gait   Pre-Gait Activities on mini-tramp:  marching, weight shifts ant / post with RLE in front and back for glute activation.  semi-tandem stance with horiz and vertical head turns (without UE support)     Gait Comments cues for Rt knee ext in stance      Knee/Hip Exercises: Stretches   Sports administrator Right;3 reps;30 seconds    Other Knee/Hip Stretches Right arm overhead reach to Lt to strech Rt low back (in standing with Lt hand on counter) x 10 sec x 3 reps       Knee/Hip Exercises: Standing   Other Standing Knee Exercises slow side stepping at counter with intermittent UE support - cues to increase step height, control of Lt foot touching ground x 10 ft x 4 reps;  Forward and retro gait with intermittent UE support on counter to  steady and offload - more UE support needed with speed reduction. Total distance forward = 50 ft, retro gait = 30 ft (with cues to increase step length with RLE.  High knee forward marching with light UE on counter x 25 ft.      Knee/Hip Exercises: Sidelying   Hip ABduction Strengthening;Right;1 set;5 reps   5 sec hold   Clams R LE x 10 reps      Knee/Hip Exercises: Prone   Hip Extension Strengthening;Right;2 sets;5 reps;10 reps    Hip Extension Limitations knee straight x 5, bent knee x 10 (improved tolerance with knee flexed)      Manual Therapy   Soft tissue mobilization STM to Rt QL and lumbar paraspinals (TPR) to decrease pain in low back after exercises.                        PT Long Term Goals - 02/23/20 1314      PT LONG TERM GOAL #1   Title The patient will be indep with HEP for LE strengthening.    Time 6    Period Weeks    Status Revised    Target Date 04/05/20      PT LONG TERM GOAL #2   Title The patient will demo hip abduction to > or equal to 4/5 strength to improve  lateral hip stability.    Time 6    Period Weeks    Status Revised    Target Date 04/05/20      PT LONG TERM GOAL #3   Title The patient will ambulate without a device for household and community distances (>1000') independently.    Time 6    Period Weeks    Status Revised    Target Date 04/05/20                 Plan - 02/28/20 2103    Clinical Impression Statement Pt demonstrating improved ability to ambulate short distances without and AD and no loss of balance.  As pt fatigues or slows speed of gait, gait deviations become more apparent.  Pt fatigues quickly with Lt sidelying hip abdct. Pt reported increased cramping/spasm in Rt QL at end of exercise; likely due to compensation for decreased hip abdct strength. Pt reported reduction of pain in R LB after TPR to Rt QL.  Pt making great gains towards remaining goals.    Stability/Clinical Decision Making Stable/Uncomplicated    Rehab Potential Good    PT Frequency 1x / week    PT Duration 6 weeks    PT Treatment/Interventions ADLs/Self Care Home Management;Gait training;Stair training;Functional mobility training;Therapeutic activities;Therapeutic exercise;Patient/family education;Electrical Stimulation;Cryotherapy;Moist Heat;Manual techniques;Taping    PT Next Visit Plan modified to 1x/week,  continue gait training and LE strengthening while maintaining post hip precautions.    PT Home Exercise Plan [email or web address]    Consulted and Agree with Plan of Care Patient           Patient will benefit from skilled therapeutic intervention in order to improve the following deficits and impairments:  Abnormal gait, Pain, Impaired flexibility, Decreased strength, Decreased range of motion, Increased fascial restricitons  Visit Diagnosis: Pain in right hip  Muscle weakness (generalized)  Other abnormalities of gait and mobility     Problem List Patient Active Problem List   Diagnosis Date Noted  . Failed total hip arthroplasty  ([place]) 11/15/2019  . IRRITABLE BOWEL SYNDROME 03/16/2010  . HIATAL HERNIA 02/12/2010  . DEPRESSION  01/04/2010  . GERD 01/04/2010  . CONSTIPATION 01/04/2010  . CHEST PAIN 01/04/2010  . PERSONAL HISTORY OF ARTHRITIS 01/04/2010   Kerin Perna, PTA 02/28/20 4:05 PM  [facility] [place], [place], [address] Phone: [phone]   Fax:  [phone]  Name: KIYLA RINGLER MRN: [id number] Date of Birth: 1950-07-13

## 2020-03-01 DIAGNOSIS — R69 Illness, unspecified: Secondary | ICD-10-CM | POA: Diagnosis not present

## 2020-03-07 DIAGNOSIS — R69 Illness, unspecified: Secondary | ICD-10-CM | POA: Diagnosis not present

## 2020-03-08 ENCOUNTER — Ambulatory Visit: Payer: Medicare HMO | Admitting: Physical Therapy

## 2020-03-08 ENCOUNTER — Other Ambulatory Visit: Payer: Self-pay

## 2020-03-08 ENCOUNTER — Encounter: Payer: Self-pay | Admitting: Physical Therapy

## 2020-03-08 DIAGNOSIS — R2689 Other abnormalities of gait and mobility: Secondary | ICD-10-CM | POA: Diagnosis not present

## 2020-03-08 DIAGNOSIS — M25551 Pain in right hip: Secondary | ICD-10-CM | POA: Diagnosis not present

## 2020-03-08 DIAGNOSIS — M6281 Muscle weakness (generalized): Secondary | ICD-10-CM

## 2020-03-08 NOTE — Patient Instructions (Signed)
   Groin Stretch    LEAN OVER Counter, stick buttocks out.  Hands on hips, back straight, feet parallel and 4"-6" greater than shoulder width apart. Slowly shift weight to one side, keeping bent knee directly over foot. Hold __20-30__ seconds. Repeat to other side.

## 2020-03-08 NOTE — Therapy (Signed)
[place] [address] [place], [place], [address] Phone: [phone]   Fax:  [phone]  Physical Therapy Treatment  Patient Details  Name: Melinda Thompson MRN: [id number] Date of Birth: 1951/01/19 Referring Provider (PT): Hector Shade, MD   Encounter Date: 03/08/2020   PT End of Session - 03/08/20 1018    Visit Number 12    Number of Visits 17    Date for PT Re-Evaluation 04/05/20    PT Start Time 0930    PT Stop Time 1012    PT Time Calculation (min) 42 min    Activity Tolerance Patient tolerated treatment well    Behavior During Therapy [facility] for tasks assessed/performed           Past Medical History:  Diagnosis Date   Anxiety    Arthritis    osteoarthritis   GERD (gastroesophageal reflux disease)    Headache    migraines   History of hiatal hernia     Past Surgical History:  Procedure Laterality Date   JOINT REPLACEMENT     bil hip   TOTAL HIP ARTHROPLASTY     bil   TOTAL HIP REVISION Right 11/15/2019   Procedure: TOTAL HIP REVISION;  Surgeon: Gaynelle Arabian, MD;  Location: [facility];  Service: Orthopedics;  Laterality: Right;  139min   vein surgey      inner thigh left leg has a ? stent done in office 1980's    There were no vitals filed for this visit.   Subjective Assessment - 03/08/20 0936    Subjective Pt reports her Rt knee has been bothering her more lately.  She has come "popping" and tightness.  She feels like she is leaning more into the cane due to knee pain.  Has been using MHP, TEN, massaging thigh,knee, and hip.    Currently in Pain? Yes    Pain Score 4     Pain Location Knee    Pain Orientation Right;Medial    Pain Descriptors / Indicators Sore;Tightness    Aggravating Factors  too much quad stretch on belly.    Pain Relieving Factors heat              [facility] PT Assessment - 03/08/20 0001      Assessment   Medical Diagnosis R total hip     Referring Provider (PT)  Hector Shade, MD    Onset Date/Surgical Date 11/15/19    Next MD Visit to be scheduled.            [facility] Adult PT Treatment/Exercise - 03/08/20 0001      Knee/Hip Exercises: Stretches   Sports administrator Right;2 reps;30 seconds   seated, foot under chair   Hip Flexor Stretch Right;4 reps    Hip Flexor Stretch Limitations standing with Rt hip ext, and 2 reps seated.     Other Knee/Hip Stretches standing Rt adductor stretch x 30 sec x 3 reps (standing, leaning over cabinet)      Knee/Hip Exercises: Aerobic   Nustep L5: 5 min for warm up.       Knee/Hip Exercises: Standing   Heel Raises Both;1 set;10 reps    Lateral Step Up Right;Left;1 set;10 reps;Hand Hold: 2;Step Height: 6"   with opp leg hip abdct.   Functional Squat 1 set;10 reps    Functional Squat Limitations mini squat at sink, tactile cues to move more weight to RLE.     Other Standing Knee Exercises slow side stepping  at counter with intermittent UE support - cues to increase step height, control of Lt foot touching ground x 10 ft x 4 reps;  Forward and retro gait with intermittent UE support on counter to steady and offload - more UE support needed with speed reduction. Total distance forward = 50 ft, retro gait = 30 ft (with cues to increase step length with RLE.       Manual Therapy   Manual therapy comments I strip to Rt medial knee to decompress tissue and increase proprioception     Soft tissue mobilization IASTM to Rt distal quad, STM to distal adductor, medial hamstring                        PT Long Term Goals - 02/23/20 1314      PT LONG TERM GOAL #1   Title The patient will be indep with HEP for LE strengthening.    Time 6    Period Weeks    Status Revised    Target Date 04/05/20      PT LONG TERM GOAL #2   Title The patient will demo hip abduction to > or equal to 4/5 strength to improve lateral hip stability.    Time 6    Period Weeks    Status Revised    Target Date 04/05/20      PT LONG TERM  GOAL #3   Title The patient will ambulate without a device for household and community distances (>1000') independently.    Time 6    Period Weeks    Status Revised    Target Date 04/05/20                 Plan - 03/08/20 1320    Clinical Impression Statement Pt reported some decrease of Rt knee pain with application of ktape to medial knee along with STM to distal hamstring/adductor. Improved exercise tolerance afterwards. Pt tolerated seated hip flexor stretch well; added this to HEP as means to stretch quad and hip flexor.  Pt making gradual gains towards new LTGs.    Stability/Clinical Decision Making Stable/Uncomplicated    Rehab Potential Good    PT Frequency 1x / week    PT Duration 6 weeks    PT Treatment/Interventions ADLs/Self Care Home Management;Gait training;Stair training;Functional mobility training;Therapeutic activities;Therapeutic exercise;Patient/family education;Electrical Stimulation;Cryotherapy;Moist Heat;Manual techniques;Taping    PT Next Visit Plan assess hip strength. continue gait training and LE strengthening while maintaining post hip precautions.    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

## 2020-03-13 ENCOUNTER — Encounter: Payer: Medicare HMO | Admitting: Physical Therapy

## 2020-03-22 ENCOUNTER — Other Ambulatory Visit: Payer: Self-pay

## 2020-03-22 ENCOUNTER — Ambulatory Visit: Payer: Medicare HMO | Admitting: Physical Therapy

## 2020-03-22 DIAGNOSIS — R2689 Other abnormalities of gait and mobility: Secondary | ICD-10-CM | POA: Diagnosis not present

## 2020-03-22 DIAGNOSIS — M25551 Pain in right hip: Secondary | ICD-10-CM

## 2020-03-22 DIAGNOSIS — M6281 Muscle weakness (generalized): Secondary | ICD-10-CM | POA: Diagnosis not present

## 2020-03-22 NOTE — Patient Instructions (Signed)
Access Code: [email or web address]: [email or web address]: 09/22/2021Prepared by: [place]  Supine Bridge - 2 x daily - 7 x weekly - 1 sets - 10 reps - 5 seconds hold  Mini Squat with Counter Support - 2 x daily - 7 x weekly - 1 sets - 10 reps  Standing Hip Extension with Counter Support - 2 x daily - 7 x weekly - 1 sets - 10 reps  Standing Single Leg Stance with Counter Support - 1 x daily - 7 x weekly - 1 sets - 3 reps - 10 hold  Heel rises with counter support - 1 x daily - 7 x weekly - 1 sets - 10 reps  Split Stance Shoulder Row with Resistance - 1 x daily - 3 x weekly - 1 sets - 10 reps  Standing Anti-Rotation Press with Anchored Resistance - 1 x daily - 3 x weekly - 1-2 sets - 10 reps  Sidestepping - 1 x daily - 3 x weekly - 2 sets - 3 reps

## 2020-03-22 NOTE — Therapy (Signed)
[place], [place], [address] Phone: [phone]   Fax:  [phone]  Physical Therapy Treatment  Patient Details  Name: Melinda Thompson MRN: [id number] Date of Birth: May 15, 1951 Referring Provider (PT): Hector Shade, MD   Encounter Date: 03/22/2020   PT End of Session - 03/22/20 0937    Visit Number 13    Number of Visits 17    Date for PT Re-Evaluation 04/05/20    PT Start Time 0931    PT Stop Time 1014    PT Time Calculation (min) 43 min    Activity Tolerance Patient tolerated treatment well    Behavior During Therapy [facility] for tasks assessed/performed           Past Medical History:  Diagnosis Date  . Anxiety   . Arthritis    osteoarthritis  . GERD (gastroesophageal reflux disease)   . Headache    migraines  . History of hiatal hernia     Past Surgical History:  Procedure Laterality Date  . JOINT REPLACEMENT     bil hip  . TOTAL HIP ARTHROPLASTY     bil  . TOTAL HIP REVISION Right 11/15/2019   Procedure: TOTAL HIP REVISION;  Surgeon: Gaynelle Arabian, MD;  Location: [facility];  Service: Orthopedics;  Laterality: Right;  137min  . vein surgey      inner thigh left leg has a ? stent done in office 1980's    There were no vitals filed for this visit.   Subjective Assessment - 03/22/20 0937    Subjective Pt reports she has been taping her knee and Rt lateral hip for comfort.  She has been using heat and massaging her hamstrings which has helped. She has been walking short bursts without cane and working a lot in garden.    Patient Stated Goals I want to be able to walk without a cane.    Currently in Pain? No/denies    Pain Score 0-No pain              [facility] PT Assessment - 03/22/20 0001      Assessment   Medical Diagnosis R total hip     Referring Provider (PT) Hector Shade, MD    Onset Date/Surgical Date 11/15/19    Next MD Visit 05/11/20      Strength   Right/Left Hip  Right    Right Hip Flexion 5/5    Right Hip Extension 4-/5    Right Hip ABduction 4-/5    Right Knee Flexion 5/5    Right Knee Extension 5/5            [facility] Adult PT Treatment/Exercise - 03/22/20 0001      Knee/Hip Exercises: Stretches   Sports administrator Right;Left;2 reps;30 seconds   seated, foot under chair   Gastroc Stretch Both;2 reps;30 seconds    Other Knee/Hip Stretches standing Rt adductor stretch x 30 sec x 3 reps (standing, leaning over cabinet)      Knee/Hip Exercises: Aerobic   Nustep L5: 6 min for warm up.       Knee/Hip Exercises: Standing   Heel Raises 1 set;10 reps   up with 2, down with RLE.    Lateral Step Up Right;1 set;5 reps;Hand Hold: 2;Step Height: 8"    Forward Step Up Right;1 set;5 reps    Other Standing Knee Exercises slow side stepping without AD x 10 ft Rt/Lt x 4 reps, cues to slow speed.  Trial of gait with walking stick instead of cane x 80 ft (noted less lateral lean)    Other Standing Knee Exercises bow and arrow row with green band x 10 reps each side, with weight shift bwteen legs;  anti-rotation with press out with green band x 5 reps each side (per HEP).              PT Education - 03/22/20 1647    Education Details HEP - updated.  Issued green band.    Person(s) Educated Patient    Methods Explanation;Demonstration;Verbal cues;Handout;Tactile cues    Comprehension Returned demonstration;Verbalized understanding               PT Long Term Goals - 02/23/20 1314      PT LONG TERM GOAL #1   Title The patient will be indep with HEP for LE strengthening.    Time 6    Period Weeks    Status Revised    Target Date 04/05/20      PT LONG TERM GOAL #2   Title The patient will demo hip abduction to > or equal to 4/5 strength to improve lateral hip stability.    Time 6    Period Weeks    Status Revised    Target Date 04/05/20      PT LONG TERM GOAL #3   Title The patient will ambulate without a device for household and community  distances (>1000') independently.    Time 6    Period Weeks    Status Revised    Target Date 04/05/20                 Plan - 03/22/20 1649    Clinical Impression Statement Pt demonstrating slight improvement in Rt hip strength since last assessment. Updated HEP.  Pt continues to complain of pain from Rt ant hip to medial knee with both hip flexion and Rt single leg stance weight bearing.  Stretched LEs today to pt's tolerance. Some Rt low back tightness reported at end of session.  Making gains towards LTGs. Pt requests to return in 2 wks after she has worked on new exercises.    Stability/Clinical Decision Making Stable/Uncomplicated    Rehab Potential Good    PT Frequency 1x / week    PT Duration 6 weeks    PT Treatment/Interventions ADLs/Self Care Home Management;Gait training;Stair training;Functional mobility training;Therapeutic activities;Therapeutic exercise;Patient/family education;Electrical Stimulation;Cryotherapy;Moist Heat;Manual techniques;Taping    PT Next Visit Plan progress HEP while maintaining post hip precautions.  assess need for further visits vs d/c.  End of POC.  FOTO?    PT Home Exercise Plan [email or web address]    Consulted and Agree with Plan of Care Patient           Patient will benefit from skilled therapeutic intervention in order to improve the following deficits and impairments:  Abnormal gait, Pain, Impaired flexibility, Decreased strength, Decreased range of motion, Increased fascial restricitons  Visit Diagnosis: Pain in right hip  Muscle weakness (generalized)  Other abnormalities of gait and mobility     Problem List Patient Active Problem List   Diagnosis Date Noted  . Failed total hip arthroplasty ([place]) 11/15/2019  . IRRITABLE BOWEL SYNDROME 03/16/2010  . HIATAL HERNIA 02/12/2010  . DEPRESSION 01/04/2010  . GERD 01/04/2010  . CONSTIPATION 01/04/2010  . CHEST PAIN 01/04/2010  . PERSONAL HISTORY OF ARTHRITIS 01/04/2010   Kerin Perna, PTA 03/22/20 5:02 PM  [address] [address]  [place], [place], [address] Phone: [phone]   Fax:  [phone]  Name: Melinda Thompson MRN: [id number] Date of Birth: 04-30-1951

## 2020-04-05 ENCOUNTER — Encounter: Payer: Medicare HMO | Admitting: Rehabilitative and Restorative Service Providers"

## 2020-04-14 ENCOUNTER — Encounter: Payer: Medicare HMO | Admitting: Rehabilitative and Restorative Service Providers"

## 2020-04-25 DIAGNOSIS — Z20822 Contact with and (suspected) exposure to covid-19: Secondary | ICD-10-CM | POA: Diagnosis not present

## 2020-04-25 DIAGNOSIS — J069 Acute upper respiratory infection, unspecified: Secondary | ICD-10-CM | POA: Diagnosis not present

## 2020-04-25 DIAGNOSIS — R058 Other specified cough: Secondary | ICD-10-CM | POA: Diagnosis not present

## 2020-04-25 DIAGNOSIS — B349 Viral infection, unspecified: Secondary | ICD-10-CM | POA: Diagnosis not present

## 2020-04-26 DIAGNOSIS — Z20822 Contact with and (suspected) exposure to covid-19: Secondary | ICD-10-CM | POA: Diagnosis not present

## 2020-04-26 DIAGNOSIS — B349 Viral infection, unspecified: Secondary | ICD-10-CM | POA: Diagnosis not present

## 2020-04-28 ENCOUNTER — Encounter: Payer: Medicare HMO | Admitting: Rehabilitative and Restorative Service Providers"

## 2020-05-08 ENCOUNTER — Other Ambulatory Visit: Payer: Self-pay

## 2020-05-08 ENCOUNTER — Encounter: Payer: Self-pay | Admitting: Rehabilitative and Restorative Service Providers"

## 2020-05-08 ENCOUNTER — Ambulatory Visit: Payer: Medicare HMO | Admitting: Rehabilitative and Restorative Service Providers"

## 2020-05-08 DIAGNOSIS — R2689 Other abnormalities of gait and mobility: Secondary | ICD-10-CM

## 2020-05-08 DIAGNOSIS — M6281 Muscle weakness (generalized): Secondary | ICD-10-CM | POA: Diagnosis not present

## 2020-05-08 DIAGNOSIS — M25551 Pain in right hip: Secondary | ICD-10-CM | POA: Diagnosis not present

## 2020-05-08 NOTE — Patient Instructions (Signed)
Access Code: [email or web address] URL: [email or web address] Date: 05/08/2020 Prepared by: Rudell Cobb  Exercises Supine Bridge - 2 x daily - 7 x weekly - 1 sets - 10 reps - 5 seconds hold Mini Squat with Counter Support - 2 x daily - 7 x weekly - 1 sets - 10 reps Standing Hip Extension with Counter Support - 2 x daily - 7 x weekly - 1 sets - 10 reps Standing Single Leg Stance with Counter Support - 1 x daily - 7 x weekly - 1 sets - 3 reps - 10 hold Heel rises with counter support - 1 x daily - 7 x weekly - 1 sets - 10 reps Split Stance Shoulder Row with Resistance - 1 x daily - 3 x weekly - 1 sets - 10 reps Standing Anti-Rotation Press with Anchored Resistance - 1 x daily - 3 x weekly - 1-2 sets - 10 reps Sidestepping - 1 x daily - 3 x weekly - 2 sets - 3 reps

## 2020-05-08 NOTE — Therapy (Addendum)
[facility] [address] [place], [place], [address] Phone: [phone]   Fax:  [phone]  Physical Therapy Treatment and Renewal Summary/ Discharge Summary  Patient Details  Name: Melinda Thompson MRN: [id number] Date of Birth: 1951/05/05 Referring Provider (PT): Hector Shade, MD   Encounter Date: 05/08/2020   PT End of Session - 05/08/20 1101    Visit Number 14    Number of Visits 18    Date for PT Re-Evaluation 06/07/20    PT Start Time 1018    PT Stop Time 1105    PT Time Calculation (min) 47 min    Activity Tolerance Patient tolerated treatment well    Behavior During Therapy [facility] for tasks assessed/performed           Past Medical History:  Diagnosis Date  . Anxiety   . Arthritis    osteoarthritis  . GERD (gastroesophageal reflux disease)   . Headache    migraines  . History of hiatal hernia     Past Surgical History:  Procedure Laterality Date  . JOINT REPLACEMENT     bil hip  . TOTAL HIP ARTHROPLASTY     bil  . TOTAL HIP REVISION Right 11/15/2019   Procedure: TOTAL HIP REVISION;  Surgeon: Gaynelle Arabian, MD;  Location: [facility];  Service: Orthopedics;  Laterality: Right;  132mn  . vein surgey      inner thigh left leg has a ? stent done in office 1980's    There were no vitals filed for this visit.   Subjective Assessment - 05/08/20 1021    Subjective The patient has cancelled multiple visits due to illness and travel.  She reports she is now using her cane more due to being more sedentary during her illness.  She feels like her hip has been more sore since she was more sedentary.  She notes her hip "really flared up".    Patient Stated Goals I want to be able to walk without a cane.    Currently in Pain? Yes    Pain Score --   it is sore x the last 4 days   Pain Location Hip    Pain Orientation Right;Lateral    Pain Descriptors / Indicators Sore;Numbness    Pain Onset More than a month ago      Pain Frequency Intermittent    Aggravating Factors  being sick and more sedentary has led to increased soreness    Pain Relieving Factors heat, tape              [facility] PT Assessment - 05/08/20 1040      Assessment   Medical Diagnosis R total hip     Referring Provider (PT) FHector Shade MD    Onset Date/Surgical Date 11/15/19                         [facility] 05/08/20 1040      Ambulation/Gait   Ambulation/Gait Yes    Ambulation/Gait Assistance 6: Modified independent (Device/Increase time)    Ambulation Distance (Feet) 150 Feet    Assistive device None;Straight cane    Gait Pattern Step-through pattern;Antalgic;Trendelenburg    Ambulation Surface Level;Indoor    Gait Comments Patient has lateral trunk lean due ot R hip weakness.       Exercises   Exercises Knee/Hip      Knee/Hip Exercises: Aerobic   Nustep L5 x 5 minutes for  warm up      Knee/Hip Exercises: Standing   Heel Raises Both;10 reps    Hip Abduction Stengthening;Right;Left;2 sets;5 reps    Abduction Limitations sidestepping without UE support    Hip Extension AROM;Stengthening;Right;Left;10 reps    Lateral Step Up Right;1 set;5 reps;Hand Hold: 2;Step Height: 4"    Forward Step Up Right;Left;5 reps;Step Height: 4"    Functional Squat 1 set;10 reps    Functional Squat Limitations No UE support    SLS near countertop for support x 10 seconds dec'ing UE support    Other Standing Knee Exercises standing glides with pillow case under L foot for abduction      Knee/Hip Exercises: Supine   Bridges Strengthening;Both;10 reps      Manual Therapy   Manual Therapy Soft tissue mobilization    Manual therapy comments to reduce R hip lateral pain    Soft tissue mobilization STM R lateral hip and IT Band with stretching                  PT Education - 05/08/20 1055    Education Details HEP    Person(s) Educated Patient    Methods  Explanation;Demonstration;Handout    Comprehension Verbalized understanding;Returned demonstration               PT Long Term Goals - 05/08/20 1028      PT LONG TERM GOAL #1   Title The patient will be indep with HEP for LE strengthening.    Time 4    Period Weeks    Status Revised    Target Date 06/07/20      PT LONG TERM GOAL #2   Title The patient will demo hip abduction to > or equal to 4/5 strength to improve lateral hip stability.    Baseline *per weakness with gait, patient has continued R hip weakness    Time 6    Period Weeks    Status Revised    Target Date 06/07/20      PT LONG TERM GOAL #3   Title The patient will ambulate without a device for household and community distances (>1000') independently.    Baseline *The patient had improved to no use of cane on beach and with home tasks, however after illness she has declined in her status.    Time 4    Period Weeks    Status Revised    Target Date 06/07/20      PT LONG TERM GOAL #4   Title --                 Plan - 05/08/20 1608    Clinical Impression Statement The patient has not been seen in PT since 03/22/20 due to travel and then an illness.  She has declined in her mobility now using the [facility] for household gait.  She is continuing with a trendelenberg pattern with compensatory R trunk lean during R mid stance of gait.  PT and patinet reviewed HEP (she had stopped doing when sick) and recommended she restart isolated hip and LE strengthening.  PT will see her 1x/week working ontransition back to pre-illness level of function.    Stability/Clinical Decision Making Stable/Uncomplicated    Rehab Potential Good    PT Frequency 1x / week    PT Duration 6 weeks    PT Treatment/Interventions ADLs/Self Care Home Management;Gait training;Stair training;Functional mobility training;Therapeutic activities;Therapeutic exercise;Patient/family education;Electrical Stimulation;Cryotherapy;Moist Heat;Manual  techniques;Taping    PT Next Visit Plan progress  HEP while maintaining post hip precautions, FOTO, work on gait mechanics to reduce trendelenberg pattern; single limb stance activities.    PT Home Exercise Plan [id number]    Consulted and Agree with Plan of Care Patient           Patient will benefit from skilled therapeutic intervention in order to improve the following deficits and impairments:  Abnormal gait, Pain, Impaired flexibility, Decreased strength, Decreased range of motion, Increased fascial restricitons  Visit Diagnosis: Pain in right hip  Muscle weakness (generalized)  Other abnormalities of gait and mobility     Problem List Patient Active Problem List   Diagnosis Date Noted  . Failed total hip arthroplasty ([place]) 11/15/2019  . IRRITABLE BOWEL SYNDROME 03/16/2010  . HIATAL HERNIA 02/12/2010  . DEPRESSION 01/04/2010  . GERD 01/04/2010  . CONSTIPATION 01/04/2010  . CHEST PAIN 01/04/2010  . PERSONAL HISTORY OF ARTHRITIS 01/04/2010   PHYSICAL THERAPY DISCHARGE SUMMARY  Visits from Start of Care: 14  Current functional level related to goals / functional outcomes: See above for patient status.   Remaining deficits: See above note-- did not return   Education / Equipment: HEP  Plan: Patient agrees to discharge.  Patient goals were partially met. Patient is being discharged due to not returning since the last visit.  ?????         Thank you for the referral of this patient. Rudell Cobb, MPT   [place], [place] 05/08/2020, 4:17 PM  [facility] [address] [place], [place], [address] Phone: [phone]   Fax:  [phone]  Name: Melinda Thompson MRN: [id number] Date of Birth: 29-Apr-1951

## 2020-05-11 DIAGNOSIS — Z96641 Presence of right artificial hip joint: Secondary | ICD-10-CM | POA: Diagnosis not present

## 2020-05-15 ENCOUNTER — Encounter: Payer: Medicare HMO | Admitting: Physical Therapy

## 2020-05-22 ENCOUNTER — Encounter: Payer: Medicare HMO | Admitting: Physical Therapy

## 2020-06-13 DIAGNOSIS — R69 Illness, unspecified: Secondary | ICD-10-CM | POA: Diagnosis not present

## 2020-09-07 DIAGNOSIS — I8312 Varicose veins of left lower extremity with inflammation: Secondary | ICD-10-CM | POA: Diagnosis not present

## 2020-09-07 DIAGNOSIS — I8311 Varicose veins of right lower extremity with inflammation: Secondary | ICD-10-CM | POA: Diagnosis not present

## 2020-09-20 DIAGNOSIS — R69 Illness, unspecified: Secondary | ICD-10-CM | POA: Diagnosis not present

## 2020-09-20 DIAGNOSIS — Z6834 Body mass index (BMI) 34.0-34.9, adult: Secondary | ICD-10-CM | POA: Diagnosis not present

## 2020-09-20 DIAGNOSIS — E669 Obesity, unspecified: Secondary | ICD-10-CM | POA: Diagnosis not present

## 2020-09-28 DIAGNOSIS — I8311 Varicose veins of right lower extremity with inflammation: Secondary | ICD-10-CM | POA: Diagnosis not present

## 2020-10-04 DIAGNOSIS — Z13820 Encounter for screening for osteoporosis: Secondary | ICD-10-CM | POA: Diagnosis not present

## 2020-10-04 DIAGNOSIS — E559 Vitamin D deficiency, unspecified: Secondary | ICD-10-CM | POA: Diagnosis not present

## 2020-10-04 DIAGNOSIS — Z1211 Encounter for screening for malignant neoplasm of colon: Secondary | ICD-10-CM | POA: Diagnosis not present

## 2020-10-04 DIAGNOSIS — T84010S Broken internal right hip prosthesis, sequela: Secondary | ICD-10-CM | POA: Diagnosis not present

## 2020-10-04 DIAGNOSIS — F320 Major depressive disorder, single episode, mild: Secondary | ICD-10-CM | POA: Diagnosis not present

## 2020-10-04 DIAGNOSIS — E669 Obesity, unspecified: Secondary | ICD-10-CM | POA: Diagnosis not present

## 2020-10-04 DIAGNOSIS — Z1231 Encounter for screening mammogram for malignant neoplasm of breast: Secondary | ICD-10-CM | POA: Diagnosis not present

## 2020-10-04 DIAGNOSIS — Z8679 Personal history of other diseases of the circulatory system: Secondary | ICD-10-CM | POA: Diagnosis not present

## 2020-10-04 DIAGNOSIS — R69 Illness, unspecified: Secondary | ICD-10-CM | POA: Diagnosis not present

## 2020-10-04 DIAGNOSIS — Z780 Asymptomatic menopausal state: Secondary | ICD-10-CM | POA: Diagnosis not present

## 2020-10-16 DIAGNOSIS — M7981 Nontraumatic hematoma of soft tissue: Secondary | ICD-10-CM | POA: Diagnosis not present

## 2020-10-16 DIAGNOSIS — I8311 Varicose veins of right lower extremity with inflammation: Secondary | ICD-10-CM | POA: Diagnosis not present

## 2020-10-26 DIAGNOSIS — S13160A Subluxation of C5/C6 cervical vertebrae, initial encounter: Secondary | ICD-10-CM | POA: Diagnosis not present

## 2020-10-26 DIAGNOSIS — Z881 Allergy status to other antibiotic agents status: Secondary | ICD-10-CM | POA: Diagnosis not present

## 2020-10-26 DIAGNOSIS — Z91013 Allergy to seafood: Secondary | ICD-10-CM | POA: Diagnosis not present

## 2020-10-26 DIAGNOSIS — R69 Illness, unspecified: Secondary | ICD-10-CM | POA: Diagnosis not present

## 2020-10-26 DIAGNOSIS — M542 Cervicalgia: Secondary | ICD-10-CM | POA: Diagnosis not present

## 2020-10-26 DIAGNOSIS — R40241 Glasgow coma scale score 13-15, unspecified time: Secondary | ICD-10-CM | POA: Diagnosis not present

## 2020-10-26 DIAGNOSIS — Z041 Encounter for examination and observation following transport accident: Secondary | ICD-10-CM | POA: Diagnosis not present

## 2020-10-26 DIAGNOSIS — Z882 Allergy status to sulfonamides status: Secondary | ICD-10-CM | POA: Diagnosis not present

## 2020-10-26 DIAGNOSIS — S161XXA Strain of muscle, fascia and tendon at neck level, initial encounter: Secondary | ICD-10-CM | POA: Diagnosis not present

## 2020-10-26 DIAGNOSIS — G9389 Other specified disorders of brain: Secondary | ICD-10-CM | POA: Diagnosis not present

## 2020-10-26 DIAGNOSIS — S13150A Subluxation of C4/C5 cervical vertebrae, initial encounter: Secondary | ICD-10-CM | POA: Diagnosis not present

## 2020-10-26 DIAGNOSIS — Z79899 Other long term (current) drug therapy: Secondary | ICD-10-CM | POA: Diagnosis not present

## 2020-10-26 DIAGNOSIS — I10 Essential (primary) hypertension: Secondary | ICD-10-CM | POA: Diagnosis not present

## 2020-10-26 DIAGNOSIS — S13170A Subluxation of C6/C7 cervical vertebrae, initial encounter: Secondary | ICD-10-CM | POA: Diagnosis not present

## 2020-10-26 DIAGNOSIS — R52 Pain, unspecified: Secondary | ICD-10-CM | POA: Diagnosis not present

## 2020-11-02 DIAGNOSIS — I8312 Varicose veins of left lower extremity with inflammation: Secondary | ICD-10-CM | POA: Diagnosis not present

## 2020-11-03 DIAGNOSIS — M791 Myalgia, unspecified site: Secondary | ICD-10-CM | POA: Diagnosis not present

## 2020-11-07 DIAGNOSIS — S70261A Insect bite (nonvenomous), right hip, initial encounter: Secondary | ICD-10-CM | POA: Diagnosis not present

## 2020-11-09 DIAGNOSIS — M25562 Pain in left knee: Secondary | ICD-10-CM | POA: Diagnosis not present

## 2020-11-09 DIAGNOSIS — Z96641 Presence of right artificial hip joint: Secondary | ICD-10-CM | POA: Diagnosis not present

## 2020-11-09 DIAGNOSIS — M25561 Pain in right knee: Secondary | ICD-10-CM | POA: Diagnosis not present

## 2020-11-10 DIAGNOSIS — M47812 Spondylosis without myelopathy or radiculopathy, cervical region: Secondary | ICD-10-CM | POA: Diagnosis not present

## 2020-11-10 DIAGNOSIS — M7918 Myalgia, other site: Secondary | ICD-10-CM | POA: Diagnosis not present

## 2020-11-10 DIAGNOSIS — M542 Cervicalgia: Secondary | ICD-10-CM | POA: Diagnosis not present

## 2020-11-16 DIAGNOSIS — M542 Cervicalgia: Secondary | ICD-10-CM | POA: Diagnosis not present

## 2020-11-16 DIAGNOSIS — M47812 Spondylosis without myelopathy or radiculopathy, cervical region: Secondary | ICD-10-CM | POA: Diagnosis not present

## 2020-11-16 DIAGNOSIS — M6281 Muscle weakness (generalized): Secondary | ICD-10-CM | POA: Diagnosis not present

## 2020-11-16 DIAGNOSIS — M6289 Other specified disorders of muscle: Secondary | ICD-10-CM | POA: Diagnosis not present

## 2020-11-16 DIAGNOSIS — M5382 Other specified dorsopathies, cervical region: Secondary | ICD-10-CM | POA: Diagnosis not present

## 2020-11-16 DIAGNOSIS — M546 Pain in thoracic spine: Secondary | ICD-10-CM | POA: Diagnosis not present

## 2020-11-22 DIAGNOSIS — M6289 Other specified disorders of muscle: Secondary | ICD-10-CM | POA: Diagnosis not present

## 2020-11-22 DIAGNOSIS — M546 Pain in thoracic spine: Secondary | ICD-10-CM | POA: Diagnosis not present

## 2020-11-22 DIAGNOSIS — M542 Cervicalgia: Secondary | ICD-10-CM | POA: Diagnosis not present

## 2020-11-22 DIAGNOSIS — M47812 Spondylosis without myelopathy or radiculopathy, cervical region: Secondary | ICD-10-CM | POA: Diagnosis not present

## 2020-11-22 DIAGNOSIS — M6281 Muscle weakness (generalized): Secondary | ICD-10-CM | POA: Diagnosis not present

## 2020-11-22 DIAGNOSIS — M5382 Other specified dorsopathies, cervical region: Secondary | ICD-10-CM | POA: Diagnosis not present

## 2020-11-29 DIAGNOSIS — M47812 Spondylosis without myelopathy or radiculopathy, cervical region: Secondary | ICD-10-CM | POA: Diagnosis not present

## 2020-11-29 DIAGNOSIS — M542 Cervicalgia: Secondary | ICD-10-CM | POA: Diagnosis not present

## 2020-11-30 DIAGNOSIS — I8311 Varicose veins of right lower extremity with inflammation: Secondary | ICD-10-CM | POA: Diagnosis not present

## 2020-11-30 DIAGNOSIS — R002 Palpitations: Secondary | ICD-10-CM | POA: Diagnosis not present

## 2020-11-30 DIAGNOSIS — E162 Hypoglycemia, unspecified: Secondary | ICD-10-CM | POA: Diagnosis not present

## 2020-12-05 DIAGNOSIS — Z1231 Encounter for screening mammogram for malignant neoplasm of breast: Secondary | ICD-10-CM | POA: Diagnosis not present

## 2020-12-05 DIAGNOSIS — N951 Menopausal and female climacteric states: Secondary | ICD-10-CM | POA: Diagnosis not present

## 2020-12-07 DIAGNOSIS — M7981 Nontraumatic hematoma of soft tissue: Secondary | ICD-10-CM | POA: Diagnosis not present

## 2020-12-07 DIAGNOSIS — I8312 Varicose veins of left lower extremity with inflammation: Secondary | ICD-10-CM | POA: Diagnosis not present

## 2020-12-12 DIAGNOSIS — M47812 Spondylosis without myelopathy or radiculopathy, cervical region: Secondary | ICD-10-CM | POA: Diagnosis not present

## 2020-12-12 DIAGNOSIS — M542 Cervicalgia: Secondary | ICD-10-CM | POA: Diagnosis not present

## 2020-12-20 DIAGNOSIS — M47812 Spondylosis without myelopathy or radiculopathy, cervical region: Secondary | ICD-10-CM | POA: Diagnosis not present

## 2020-12-20 DIAGNOSIS — S30861A Insect bite (nonvenomous) of abdominal wall, initial encounter: Secondary | ICD-10-CM | POA: Diagnosis not present

## 2020-12-20 DIAGNOSIS — M542 Cervicalgia: Secondary | ICD-10-CM | POA: Diagnosis not present

## 2020-12-20 DIAGNOSIS — R1903 Right lower quadrant abdominal swelling, mass and lump: Secondary | ICD-10-CM | POA: Diagnosis not present

## 2020-12-20 DIAGNOSIS — W57XXXA Bitten or stung by nonvenomous insect and other nonvenomous arthropods, initial encounter: Secondary | ICD-10-CM | POA: Diagnosis not present

## 2020-12-21 DIAGNOSIS — R1903 Right lower quadrant abdominal swelling, mass and lump: Secondary | ICD-10-CM | POA: Diagnosis not present

## 2020-12-28 DIAGNOSIS — M542 Cervicalgia: Secondary | ICD-10-CM | POA: Diagnosis not present

## 2020-12-28 DIAGNOSIS — M47812 Spondylosis without myelopathy or radiculopathy, cervical region: Secondary | ICD-10-CM | POA: Diagnosis not present

## 2020-12-28 DIAGNOSIS — I8311 Varicose veins of right lower extremity with inflammation: Secondary | ICD-10-CM | POA: Diagnosis not present

## 2021-01-04 DIAGNOSIS — I8312 Varicose veins of left lower extremity with inflammation: Secondary | ICD-10-CM | POA: Diagnosis not present

## 2021-01-04 DIAGNOSIS — M7981 Nontraumatic hematoma of soft tissue: Secondary | ICD-10-CM | POA: Diagnosis not present

## 2021-01-11 DIAGNOSIS — R1900 Intra-abdominal and pelvic swelling, mass and lump, unspecified site: Secondary | ICD-10-CM | POA: Diagnosis not present

## 2021-01-12 DIAGNOSIS — M7918 Myalgia, other site: Secondary | ICD-10-CM | POA: Diagnosis not present

## 2021-01-12 DIAGNOSIS — M47812 Spondylosis without myelopathy or radiculopathy, cervical region: Secondary | ICD-10-CM | POA: Diagnosis not present

## 2021-01-12 DIAGNOSIS — M542 Cervicalgia: Secondary | ICD-10-CM | POA: Diagnosis not present

## 2021-01-18 DIAGNOSIS — M47812 Spondylosis without myelopathy or radiculopathy, cervical region: Secondary | ICD-10-CM | POA: Diagnosis not present

## 2021-01-18 DIAGNOSIS — M7918 Myalgia, other site: Secondary | ICD-10-CM | POA: Diagnosis not present

## 2021-01-18 DIAGNOSIS — M542 Cervicalgia: Secondary | ICD-10-CM | POA: Diagnosis not present

## 2021-01-23 DIAGNOSIS — R1900 Intra-abdominal and pelvic swelling, mass and lump, unspecified site: Secondary | ICD-10-CM | POA: Diagnosis not present

## 2021-01-23 DIAGNOSIS — K5900 Constipation, unspecified: Secondary | ICD-10-CM | POA: Diagnosis not present

## 2021-01-23 DIAGNOSIS — K4090 Unilateral inguinal hernia, without obstruction or gangrene, not specified as recurrent: Secondary | ICD-10-CM | POA: Diagnosis not present

## 2021-01-23 DIAGNOSIS — R109 Unspecified abdominal pain: Secondary | ICD-10-CM | POA: Diagnosis not present

## 2021-01-23 DIAGNOSIS — K449 Diaphragmatic hernia without obstruction or gangrene: Secondary | ICD-10-CM | POA: Diagnosis not present

## 2021-01-26 DIAGNOSIS — G8929 Other chronic pain: Secondary | ICD-10-CM | POA: Diagnosis not present

## 2021-01-26 DIAGNOSIS — Z1239 Encounter for other screening for malignant neoplasm of breast: Secondary | ICD-10-CM | POA: Diagnosis not present

## 2021-01-26 DIAGNOSIS — M25562 Pain in left knee: Secondary | ICD-10-CM | POA: Diagnosis not present

## 2021-01-26 DIAGNOSIS — Z13820 Encounter for screening for osteoporosis: Secondary | ICD-10-CM | POA: Diagnosis not present

## 2021-01-26 DIAGNOSIS — Z0000 Encounter for general adult medical examination without abnormal findings: Secondary | ICD-10-CM | POA: Diagnosis not present

## 2021-01-26 DIAGNOSIS — Z13220 Encounter for screening for lipoid disorders: Secondary | ICD-10-CM | POA: Diagnosis not present

## 2021-01-30 DIAGNOSIS — M1712 Unilateral primary osteoarthritis, left knee: Secondary | ICD-10-CM | POA: Diagnosis not present

## 2021-01-30 DIAGNOSIS — G8929 Other chronic pain: Secondary | ICD-10-CM | POA: Diagnosis not present

## 2021-01-30 DIAGNOSIS — M25562 Pain in left knee: Secondary | ICD-10-CM | POA: Diagnosis not present

## 2021-02-07 DIAGNOSIS — D123 Benign neoplasm of transverse colon: Secondary | ICD-10-CM | POA: Diagnosis not present

## 2021-02-07 DIAGNOSIS — Z1211 Encounter for screening for malignant neoplasm of colon: Secondary | ICD-10-CM | POA: Diagnosis not present

## 2021-02-07 DIAGNOSIS — K649 Unspecified hemorrhoids: Secondary | ICD-10-CM | POA: Diagnosis not present

## 2021-02-07 DIAGNOSIS — K5730 Diverticulosis of large intestine without perforation or abscess without bleeding: Secondary | ICD-10-CM | POA: Diagnosis not present

## 2021-02-09 DIAGNOSIS — K4090 Unilateral inguinal hernia, without obstruction or gangrene, not specified as recurrent: Secondary | ICD-10-CM | POA: Diagnosis not present

## 2021-03-12 DIAGNOSIS — E785 Hyperlipidemia, unspecified: Secondary | ICD-10-CM | POA: Diagnosis not present

## 2021-03-12 DIAGNOSIS — Z885 Allergy status to narcotic agent status: Secondary | ICD-10-CM | POA: Diagnosis not present

## 2021-03-12 DIAGNOSIS — Z91013 Allergy to seafood: Secondary | ICD-10-CM | POA: Diagnosis not present

## 2021-03-12 DIAGNOSIS — Z6832 Body mass index (BMI) 32.0-32.9, adult: Secondary | ICD-10-CM | POA: Diagnosis not present

## 2021-03-12 DIAGNOSIS — Z882 Allergy status to sulfonamides status: Secondary | ICD-10-CM | POA: Diagnosis not present

## 2021-03-12 DIAGNOSIS — K4190 Unilateral femoral hernia, without obstruction or gangrene, not specified as recurrent: Secondary | ICD-10-CM | POA: Diagnosis not present

## 2021-03-12 DIAGNOSIS — R69 Illness, unspecified: Secondary | ICD-10-CM | POA: Diagnosis not present

## 2021-03-12 DIAGNOSIS — Z79899 Other long term (current) drug therapy: Secondary | ICD-10-CM | POA: Diagnosis not present

## 2021-03-12 DIAGNOSIS — E669 Obesity, unspecified: Secondary | ICD-10-CM | POA: Diagnosis not present

## 2021-03-12 DIAGNOSIS — K219 Gastro-esophageal reflux disease without esophagitis: Secondary | ICD-10-CM | POA: Diagnosis not present

## 2021-03-12 DIAGNOSIS — K4090 Unilateral inguinal hernia, without obstruction or gangrene, not specified as recurrent: Secondary | ICD-10-CM | POA: Diagnosis not present

## 2021-03-20 DIAGNOSIS — N951 Menopausal and female climacteric states: Secondary | ICD-10-CM | POA: Diagnosis not present

## 2021-03-21 DIAGNOSIS — M542 Cervicalgia: Secondary | ICD-10-CM | POA: Diagnosis not present

## 2021-03-21 DIAGNOSIS — M5481 Occipital neuralgia: Secondary | ICD-10-CM | POA: Diagnosis not present

## 2021-03-21 DIAGNOSIS — M7918 Myalgia, other site: Secondary | ICD-10-CM | POA: Diagnosis not present

## 2021-04-23 DIAGNOSIS — R002 Palpitations: Secondary | ICD-10-CM | POA: Diagnosis not present

## 2021-07-30 DIAGNOSIS — Z1231 Encounter for screening mammogram for malignant neoplasm of breast: Secondary | ICD-10-CM | POA: Diagnosis not present

## 2021-07-30 DIAGNOSIS — R69 Illness, unspecified: Secondary | ICD-10-CM | POA: Diagnosis not present

## 2021-07-30 DIAGNOSIS — E7800 Pure hypercholesterolemia, unspecified: Secondary | ICD-10-CM | POA: Diagnosis not present

## 2021-07-30 DIAGNOSIS — F325 Major depressive disorder, single episode, in full remission: Secondary | ICD-10-CM | POA: Diagnosis not present

## 2021-07-30 DIAGNOSIS — E162 Hypoglycemia, unspecified: Secondary | ICD-10-CM | POA: Diagnosis not present

## 2021-07-30 DIAGNOSIS — B001 Herpesviral vesicular dermatitis: Secondary | ICD-10-CM | POA: Diagnosis not present

## 2021-07-30 DIAGNOSIS — Z1283 Encounter for screening for malignant neoplasm of skin: Secondary | ICD-10-CM | POA: Diagnosis not present

## 2021-07-30 DIAGNOSIS — Z780 Asymptomatic menopausal state: Secondary | ICD-10-CM | POA: Diagnosis not present

## 2021-09-03 DIAGNOSIS — M8588 Other specified disorders of bone density and structure, other site: Secondary | ICD-10-CM | POA: Diagnosis not present

## 2021-09-03 DIAGNOSIS — M8589 Other specified disorders of bone density and structure, multiple sites: Secondary | ICD-10-CM | POA: Diagnosis not present

## 2021-09-03 DIAGNOSIS — Z1231 Encounter for screening mammogram for malignant neoplasm of breast: Secondary | ICD-10-CM | POA: Diagnosis not present

## 2021-09-03 DIAGNOSIS — Z780 Asymptomatic menopausal state: Secondary | ICD-10-CM | POA: Diagnosis not present

## 2021-09-12 DIAGNOSIS — R002 Palpitations: Secondary | ICD-10-CM | POA: Diagnosis not present

## 2021-10-10 DIAGNOSIS — G8929 Other chronic pain: Secondary | ICD-10-CM | POA: Diagnosis not present

## 2021-10-10 DIAGNOSIS — Z008 Encounter for other general examination: Secondary | ICD-10-CM | POA: Diagnosis not present

## 2021-10-10 DIAGNOSIS — E669 Obesity, unspecified: Secondary | ICD-10-CM | POA: Diagnosis not present

## 2021-10-10 DIAGNOSIS — K219 Gastro-esophageal reflux disease without esophagitis: Secondary | ICD-10-CM | POA: Diagnosis not present

## 2021-10-10 DIAGNOSIS — Z825 Family history of asthma and other chronic lower respiratory diseases: Secondary | ICD-10-CM | POA: Diagnosis not present

## 2021-10-10 DIAGNOSIS — Z8249 Family history of ischemic heart disease and other diseases of the circulatory system: Secondary | ICD-10-CM | POA: Diagnosis not present

## 2021-10-10 DIAGNOSIS — I499 Cardiac arrhythmia, unspecified: Secondary | ICD-10-CM | POA: Diagnosis not present

## 2021-10-10 DIAGNOSIS — Z6831 Body mass index (BMI) 31.0-31.9, adult: Secondary | ICD-10-CM | POA: Diagnosis not present

## 2021-10-11 DIAGNOSIS — R69 Illness, unspecified: Secondary | ICD-10-CM | POA: Diagnosis not present

## 2021-10-11 DIAGNOSIS — E162 Hypoglycemia, unspecified: Secondary | ICD-10-CM | POA: Diagnosis not present

## 2021-10-11 DIAGNOSIS — F5089 Other specified eating disorder: Secondary | ICD-10-CM | POA: Diagnosis not present

## 2021-10-11 DIAGNOSIS — H524 Presbyopia: Secondary | ICD-10-CM | POA: Diagnosis not present

## 2021-10-18 DIAGNOSIS — F430 Acute stress reaction: Secondary | ICD-10-CM | POA: Diagnosis not present

## 2021-10-18 DIAGNOSIS — R69 Illness, unspecified: Secondary | ICD-10-CM | POA: Diagnosis not present

## 2021-10-22 DIAGNOSIS — R69 Illness, unspecified: Secondary | ICD-10-CM | POA: Diagnosis not present

## 2021-10-22 DIAGNOSIS — F430 Acute stress reaction: Secondary | ICD-10-CM | POA: Diagnosis not present

## 2021-10-25 DIAGNOSIS — R69 Illness, unspecified: Secondary | ICD-10-CM | POA: Diagnosis not present

## 2021-10-25 DIAGNOSIS — F430 Acute stress reaction: Secondary | ICD-10-CM | POA: Diagnosis not present

## 2021-10-31 DIAGNOSIS — R69 Illness, unspecified: Secondary | ICD-10-CM | POA: Diagnosis not present

## 2021-10-31 DIAGNOSIS — F430 Acute stress reaction: Secondary | ICD-10-CM | POA: Diagnosis not present

## 2021-11-13 DIAGNOSIS — R002 Palpitations: Secondary | ICD-10-CM | POA: Diagnosis not present

## 2021-11-14 DIAGNOSIS — F430 Acute stress reaction: Secondary | ICD-10-CM | POA: Diagnosis not present

## 2021-11-14 DIAGNOSIS — R69 Illness, unspecified: Secondary | ICD-10-CM | POA: Diagnosis not present

## 2021-11-17 DIAGNOSIS — M25561 Pain in right knee: Secondary | ICD-10-CM | POA: Diagnosis not present

## 2021-11-28 DIAGNOSIS — F430 Acute stress reaction: Secondary | ICD-10-CM | POA: Diagnosis not present

## 2021-11-28 DIAGNOSIS — M25571 Pain in right ankle and joints of right foot: Secondary | ICD-10-CM | POA: Diagnosis not present

## 2021-11-28 DIAGNOSIS — R69 Illness, unspecified: Secondary | ICD-10-CM | POA: Diagnosis not present

## 2021-11-28 DIAGNOSIS — M25562 Pain in left knee: Secondary | ICD-10-CM | POA: Diagnosis not present

## 2021-11-28 DIAGNOSIS — S93491A Sprain of other ligament of right ankle, initial encounter: Secondary | ICD-10-CM | POA: Diagnosis not present

## 2021-11-28 DIAGNOSIS — S8002XA Contusion of left knee, initial encounter: Secondary | ICD-10-CM | POA: Diagnosis not present

## 2021-12-12 DIAGNOSIS — S8011XA Contusion of right lower leg, initial encounter: Secondary | ICD-10-CM | POA: Diagnosis not present

## 2021-12-12 DIAGNOSIS — M79604 Pain in right leg: Secondary | ICD-10-CM | POA: Diagnosis not present

## 2021-12-13 DIAGNOSIS — W19XXXD Unspecified fall, subsequent encounter: Secondary | ICD-10-CM | POA: Diagnosis not present

## 2021-12-13 DIAGNOSIS — M25561 Pain in right knee: Secondary | ICD-10-CM | POA: Diagnosis not present

## 2021-12-13 DIAGNOSIS — G8929 Other chronic pain: Secondary | ICD-10-CM | POA: Diagnosis not present

## 2021-12-13 DIAGNOSIS — M25562 Pain in left knee: Secondary | ICD-10-CM | POA: Diagnosis not present

## 2021-12-13 DIAGNOSIS — Y92009 Unspecified place in unspecified non-institutional (private) residence as the place of occurrence of the external cause: Secondary | ICD-10-CM | POA: Diagnosis not present

## 2021-12-20 DIAGNOSIS — R002 Palpitations: Secondary | ICD-10-CM | POA: Diagnosis not present

## 2021-12-25 DIAGNOSIS — M25552 Pain in left hip: Secondary | ICD-10-CM | POA: Diagnosis not present

## 2021-12-25 DIAGNOSIS — M25551 Pain in right hip: Secondary | ICD-10-CM | POA: Diagnosis not present

## 2021-12-25 DIAGNOSIS — M25562 Pain in left knee: Secondary | ICD-10-CM | POA: Diagnosis not present

## 2021-12-25 DIAGNOSIS — S8001XA Contusion of right knee, initial encounter: Secondary | ICD-10-CM | POA: Diagnosis not present

## 2022-01-02 DIAGNOSIS — I480 Paroxysmal atrial fibrillation: Secondary | ICD-10-CM | POA: Diagnosis not present

## 2022-01-14 DIAGNOSIS — E7800 Pure hypercholesterolemia, unspecified: Secondary | ICD-10-CM | POA: Diagnosis not present

## 2022-01-14 DIAGNOSIS — I480 Paroxysmal atrial fibrillation: Secondary | ICD-10-CM | POA: Diagnosis not present

## 2022-01-14 DIAGNOSIS — R0602 Shortness of breath: Secondary | ICD-10-CM | POA: Diagnosis not present

## 2022-01-14 DIAGNOSIS — R030 Elevated blood-pressure reading, without diagnosis of hypertension: Secondary | ICD-10-CM | POA: Diagnosis not present

## 2022-01-17 DIAGNOSIS — I480 Paroxysmal atrial fibrillation: Secondary | ICD-10-CM | POA: Diagnosis not present

## 2022-01-24 DIAGNOSIS — I480 Paroxysmal atrial fibrillation: Secondary | ICD-10-CM | POA: Diagnosis not present

## 2022-01-24 DIAGNOSIS — E7800 Pure hypercholesterolemia, unspecified: Secondary | ICD-10-CM | POA: Diagnosis not present

## 2022-03-21 DIAGNOSIS — M25562 Pain in left knee: Secondary | ICD-10-CM | POA: Diagnosis not present

## 2022-03-25 DIAGNOSIS — M25562 Pain in left knee: Secondary | ICD-10-CM | POA: Diagnosis not present

## 2022-04-10 IMAGING — DX DG FEMUR 2+V PORT*R*
2 series · 2 of 2 positions shown · non-contrast
Comparison: None.

CLINICAL DATA: Total hip revision, intraop.

EXAM:
RIGHT FEMUR PORTABLE 2 VIEW

[hip ap (1 of 2)]
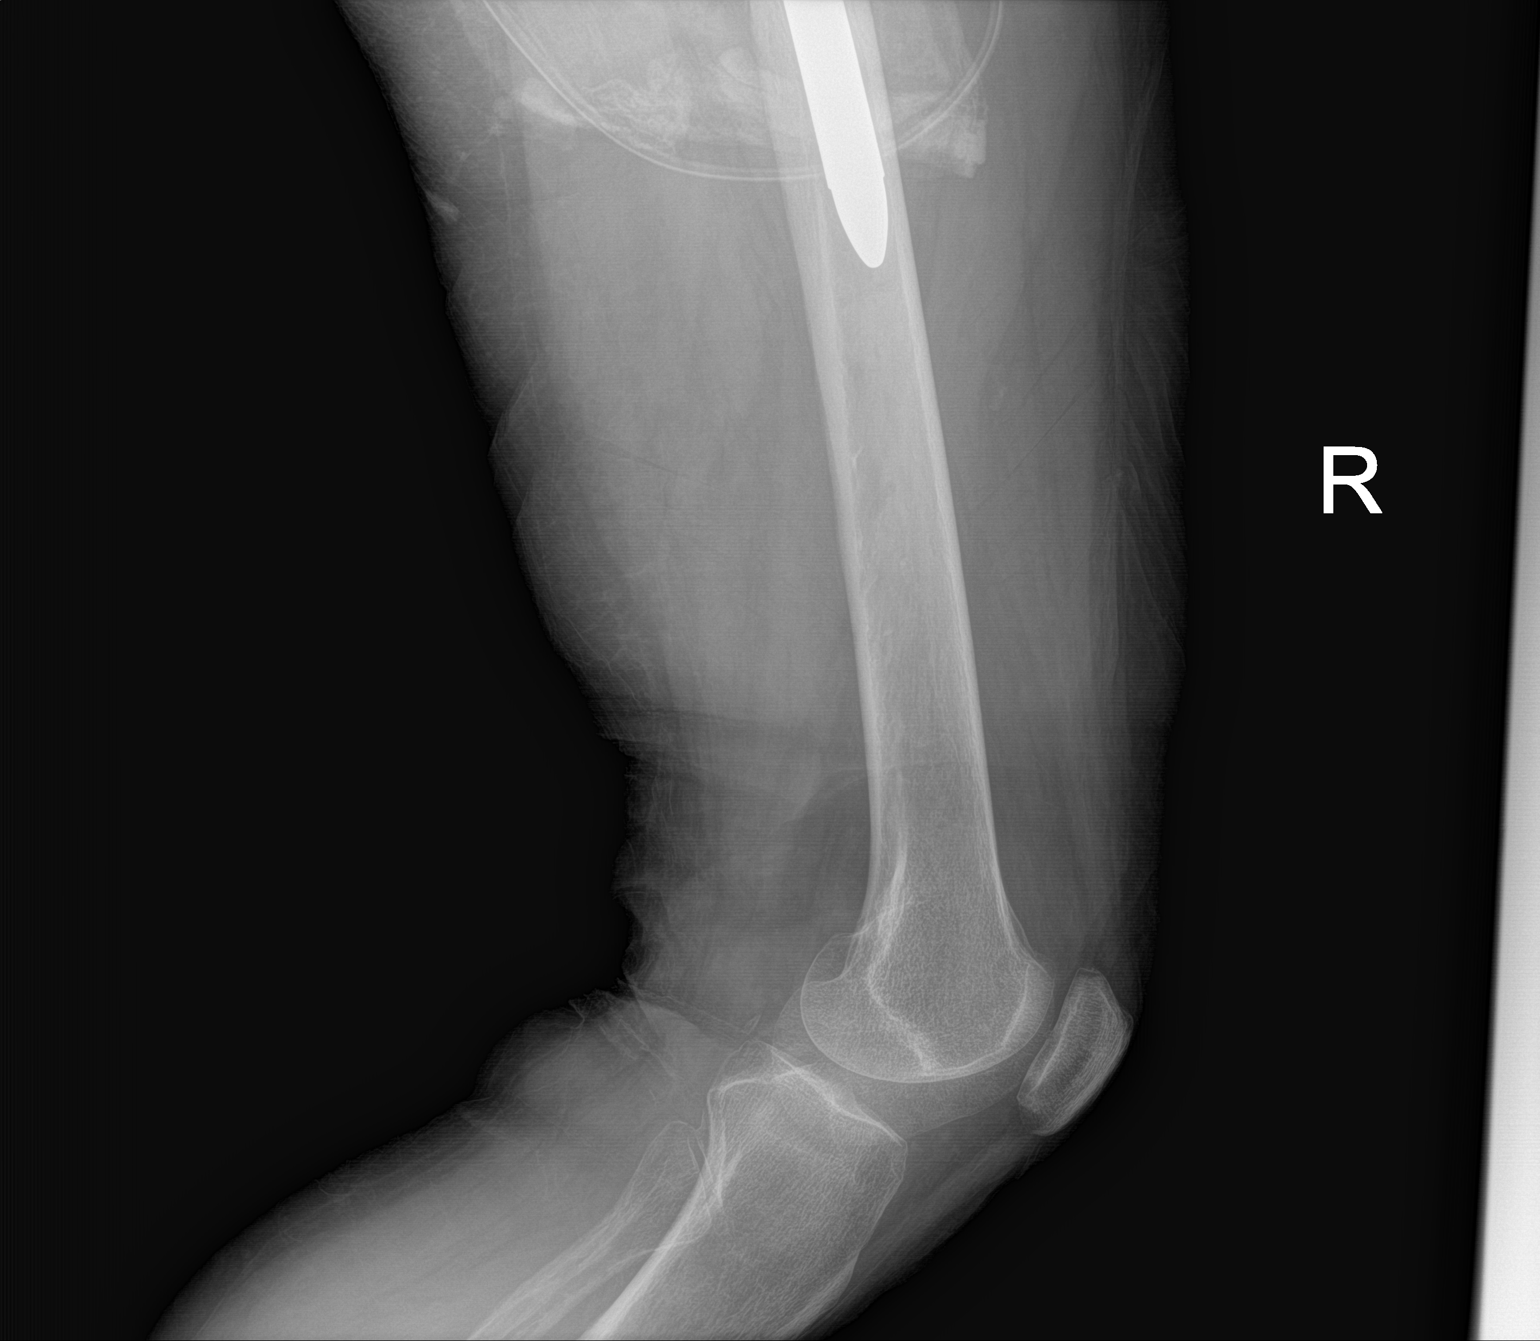

[hip ap (2 of 2)]
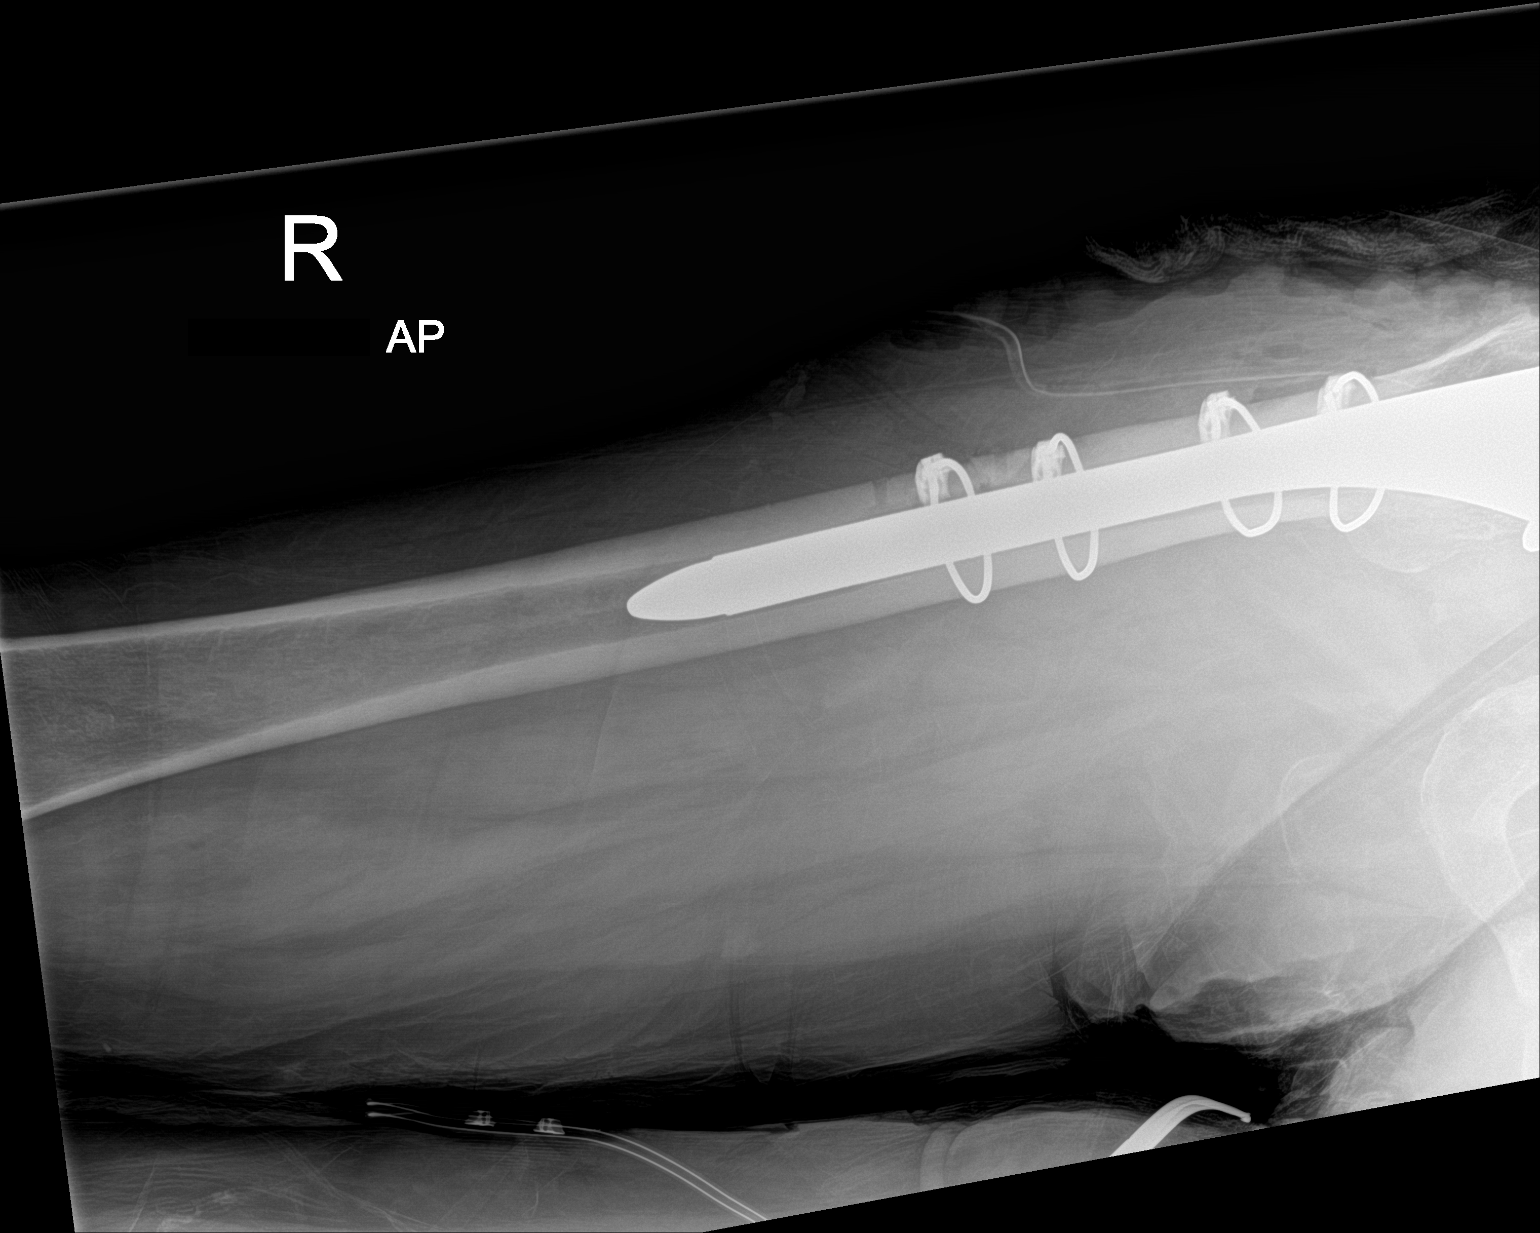

[2 of 2 positions shown; findings below may reference images not displayed]

FINDINGS: Frontal and lateral views of the distal femoral shaft, reportedly
intraop. Distal femoral stem with cerclage wire fixation. No
evidence of periprosthetic fracture. Proximal aspect of the
prosthesis is not included. Surgical drain is in place.
IMPRESSION: Intraoperative imaging with intact distal femoral stem.

## 2022-04-10 IMAGING — DX DG PORTABLE PELVIS
1 series · 2 of 2 positions shown · non-contrast
Comparison: Preoperative radiograph 11/19/2012

CLINICAL DATA: Status post total right hip revision.

EXAM:
PORTABLE PELVIS 1-2 VIEWS

[Series 1: pelvis ap · 0.14mm/px · 2 of 2 slices shown]
[im 1/2]
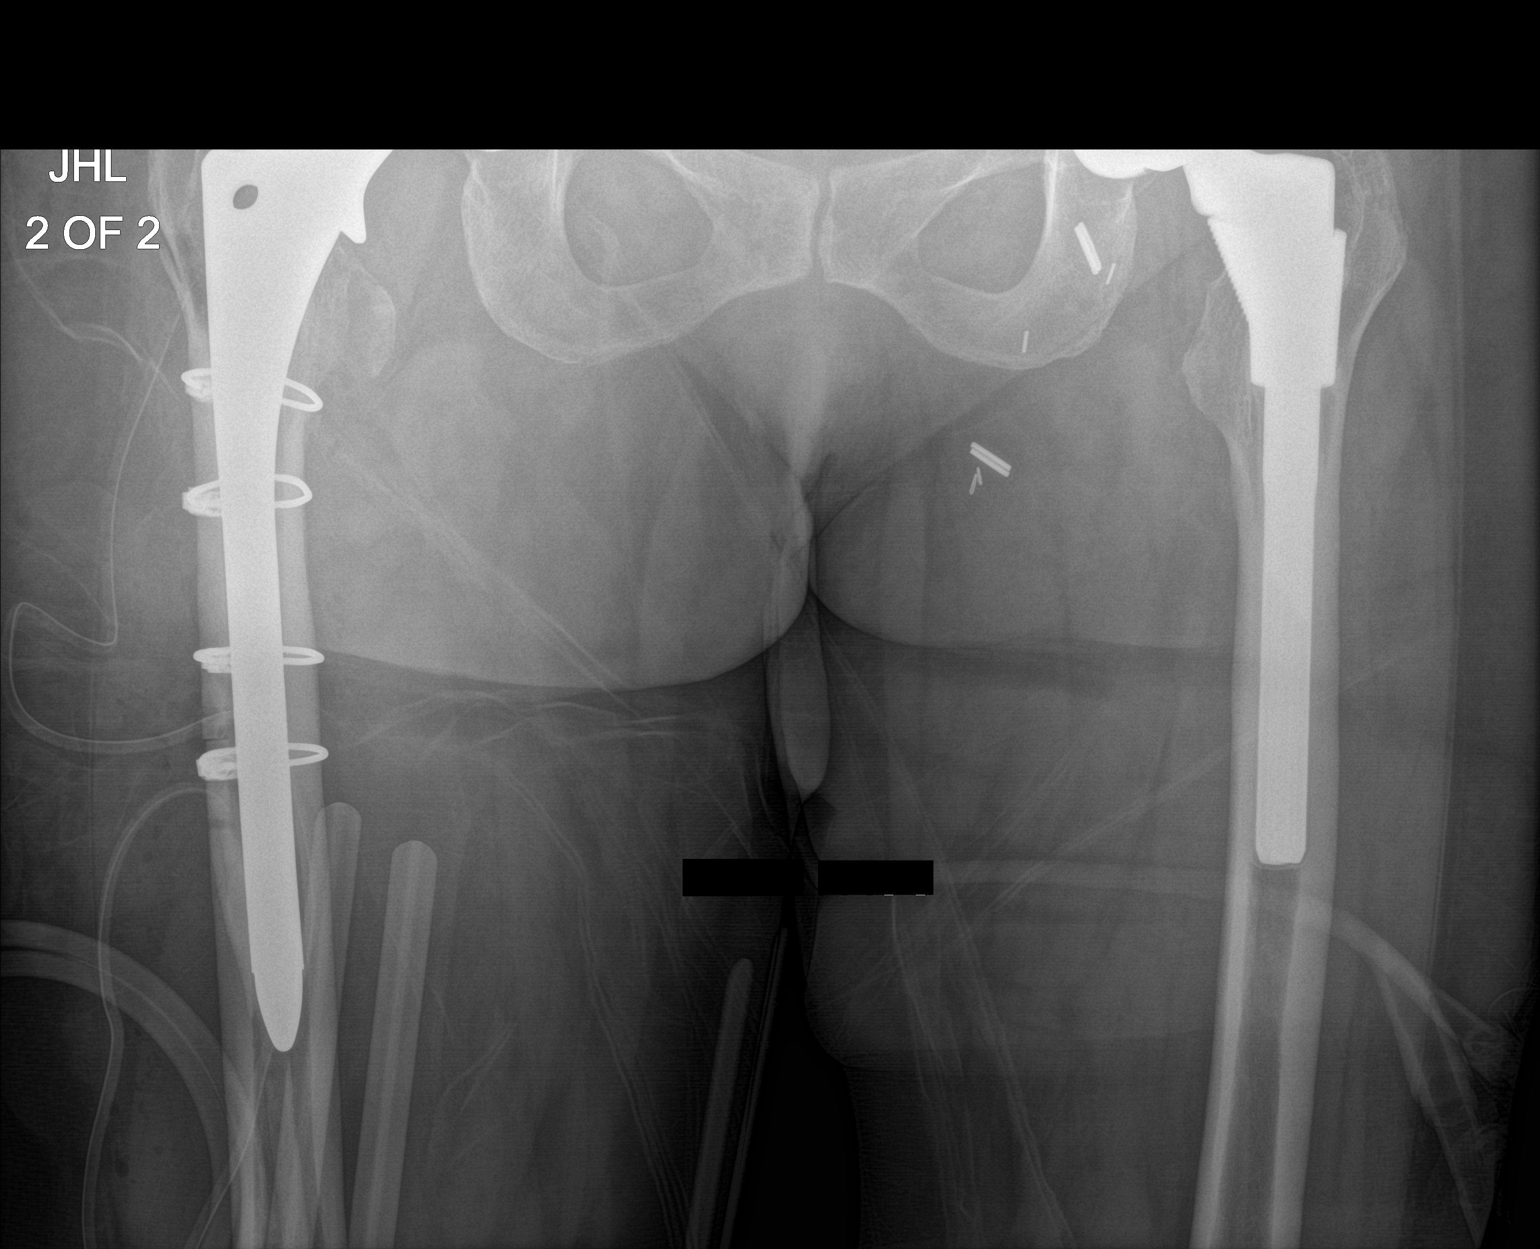
[im 2/2]
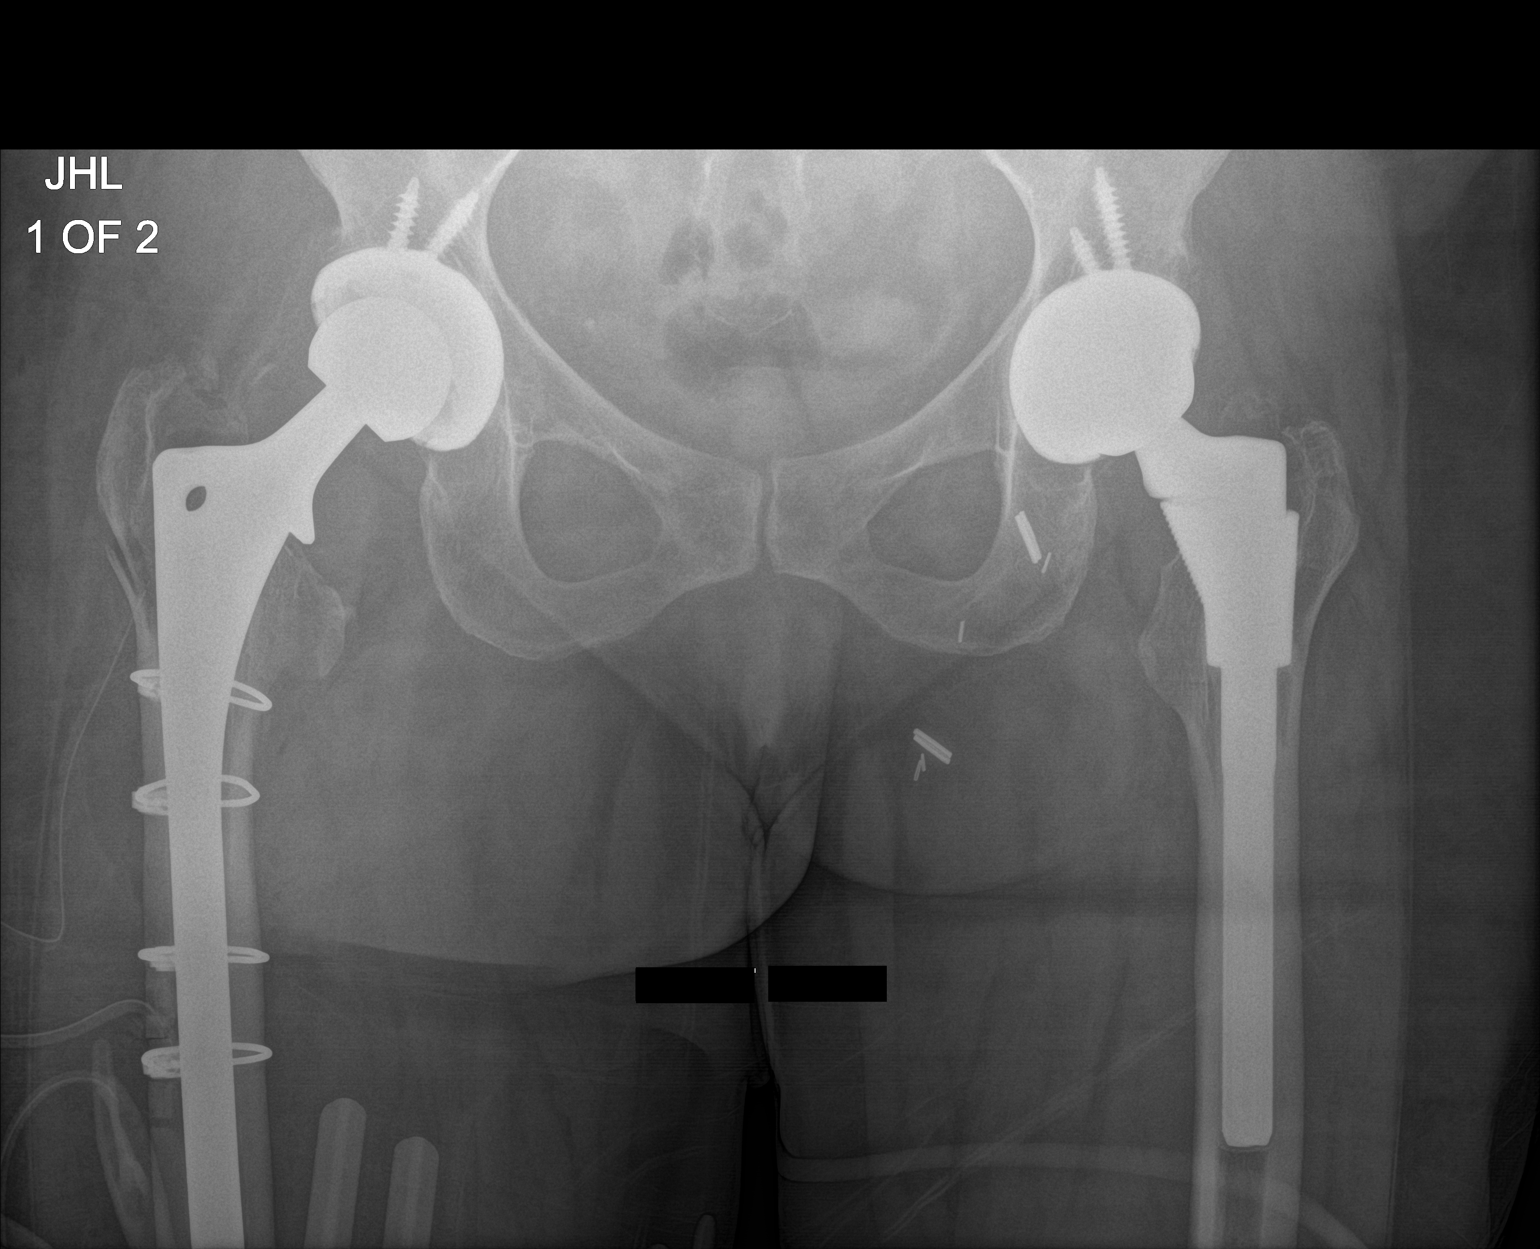

[2 of 2 positions shown; findings below may reference images not displayed]

FINDINGS: Revision right hip arthroplasty in expected alignment. Cerclage wire
fixation of the femoral stem. There is a fracture through the
intertrochanteric femur laterally adjacent to the proximal stone, of
uncertain acuity. There is a surgical drain in place. Left hip
arthroplasty is intact were visualized. Pubic rami are intact.
IMPRESSION: 1. Revision right hip arthroplasty in expected alignment.
2. Right intertrochanteric femur fracture adjacent to the lateral
femoral stem, of uncertain acuity.

## 2022-05-01 DIAGNOSIS — E7800 Pure hypercholesterolemia, unspecified: Secondary | ICD-10-CM | POA: Diagnosis not present

## 2022-05-01 DIAGNOSIS — I480 Paroxysmal atrial fibrillation: Secondary | ICD-10-CM | POA: Diagnosis not present

## 2022-05-01 DIAGNOSIS — F320 Major depressive disorder, single episode, mild: Secondary | ICD-10-CM | POA: Diagnosis not present

## 2022-05-01 DIAGNOSIS — Z0000 Encounter for general adult medical examination without abnormal findings: Secondary | ICD-10-CM | POA: Diagnosis not present

## 2022-05-01 DIAGNOSIS — R69 Illness, unspecified: Secondary | ICD-10-CM | POA: Diagnosis not present

## 2022-06-26 DIAGNOSIS — M47814 Spondylosis without myelopathy or radiculopathy, thoracic region: Secondary | ICD-10-CM | POA: Diagnosis not present

## 2022-06-26 DIAGNOSIS — M546 Pain in thoracic spine: Secondary | ICD-10-CM | POA: Diagnosis not present

## 2022-06-26 DIAGNOSIS — M4314 Spondylolisthesis, thoracic region: Secondary | ICD-10-CM | POA: Diagnosis not present

## 2022-06-26 DIAGNOSIS — M4856XA Collapsed vertebra, not elsewhere classified, lumbar region, initial encounter for fracture: Secondary | ICD-10-CM | POA: Diagnosis not present

## 2022-06-26 DIAGNOSIS — M47812 Spondylosis without myelopathy or radiculopathy, cervical region: Secondary | ICD-10-CM | POA: Diagnosis not present

## 2022-06-26 DIAGNOSIS — M47816 Spondylosis without myelopathy or radiculopathy, lumbar region: Secondary | ICD-10-CM | POA: Diagnosis not present

## 2022-06-26 DIAGNOSIS — M4312 Spondylolisthesis, cervical region: Secondary | ICD-10-CM | POA: Diagnosis not present

## 2022-06-26 DIAGNOSIS — M545 Low back pain, unspecified: Secondary | ICD-10-CM | POA: Diagnosis not present

## 2022-06-26 DIAGNOSIS — M4802 Spinal stenosis, cervical region: Secondary | ICD-10-CM | POA: Diagnosis not present

## 2022-06-26 DIAGNOSIS — M40204 Unspecified kyphosis, thoracic region: Secondary | ICD-10-CM | POA: Diagnosis not present

## 2022-06-26 DIAGNOSIS — M50322 Other cervical disc degeneration at C5-C6 level: Secondary | ICD-10-CM | POA: Diagnosis not present

## 2022-06-26 DIAGNOSIS — M4316 Spondylolisthesis, lumbar region: Secondary | ICD-10-CM | POA: Diagnosis not present

## 2022-06-26 DIAGNOSIS — G8929 Other chronic pain: Secondary | ICD-10-CM | POA: Diagnosis not present

## 2022-07-03 DIAGNOSIS — M545 Low back pain, unspecified: Secondary | ICD-10-CM | POA: Diagnosis not present

## 2022-07-03 DIAGNOSIS — G8929 Other chronic pain: Secondary | ICD-10-CM | POA: Diagnosis not present

## 2022-07-03 DIAGNOSIS — M546 Pain in thoracic spine: Secondary | ICD-10-CM | POA: Diagnosis not present

## 2022-07-10 DIAGNOSIS — I480 Paroxysmal atrial fibrillation: Secondary | ICD-10-CM | POA: Diagnosis not present

## 2022-07-25 DIAGNOSIS — L989 Disorder of the skin and subcutaneous tissue, unspecified: Secondary | ICD-10-CM | POA: Diagnosis not present

## 2022-08-05 DIAGNOSIS — M79671 Pain in right foot: Secondary | ICD-10-CM | POA: Diagnosis not present

## 2022-08-05 DIAGNOSIS — M79672 Pain in left foot: Secondary | ICD-10-CM | POA: Diagnosis not present

## 2022-08-05 DIAGNOSIS — F320 Major depressive disorder, single episode, mild: Secondary | ICD-10-CM | POA: Diagnosis not present

## 2022-08-05 DIAGNOSIS — R69 Illness, unspecified: Secondary | ICD-10-CM | POA: Diagnosis not present

## 2022-08-05 DIAGNOSIS — F411 Generalized anxiety disorder: Secondary | ICD-10-CM | POA: Diagnosis not present

## 2022-08-05 DIAGNOSIS — E7800 Pure hypercholesterolemia, unspecified: Secondary | ICD-10-CM | POA: Diagnosis not present

## 2022-08-12 DIAGNOSIS — U071 COVID-19: Secondary | ICD-10-CM | POA: Diagnosis not present

## 2022-08-12 DIAGNOSIS — R509 Fever, unspecified: Secondary | ICD-10-CM | POA: Diagnosis not present

## 2022-08-26 DIAGNOSIS — I83893 Varicose veins of bilateral lower extremities with other complications: Secondary | ICD-10-CM | POA: Diagnosis not present

## 2022-08-26 DIAGNOSIS — I83891 Varicose veins of right lower extremities with other complications: Secondary | ICD-10-CM | POA: Diagnosis not present

## 2022-08-26 DIAGNOSIS — G2581 Restless legs syndrome: Secondary | ICD-10-CM | POA: Diagnosis not present

## 2022-08-26 DIAGNOSIS — R252 Cramp and spasm: Secondary | ICD-10-CM | POA: Diagnosis not present

## 2022-08-26 DIAGNOSIS — R600 Localized edema: Secondary | ICD-10-CM | POA: Diagnosis not present

## 2022-09-03 ENCOUNTER — Ambulatory Visit
Admission: EM | Admit: 2022-09-03 | Discharge: 2022-09-03 | Disposition: A | Discharge status: Home / Self Care | Payer: Medicare HMO | Attending: Emergency Medicine | Admitting: Emergency Medicine

## 2022-09-03 ENCOUNTER — Encounter: Payer: Self-pay | Admitting: Emergency Medicine

## 2022-09-03 ENCOUNTER — Ambulatory Visit (INDEPENDENT_AMBULATORY_CARE_PROVIDER_SITE_OTHER): Payer: Medicare HMO

## 2022-09-03 DIAGNOSIS — M5489 Other dorsalgia: Secondary | ICD-10-CM | POA: Diagnosis not present

## 2022-09-03 DIAGNOSIS — S299XXA Unspecified injury of thorax, initial encounter: Secondary | ICD-10-CM

## 2022-09-03 DIAGNOSIS — R051 Acute cough: Secondary | ICD-10-CM

## 2022-09-03 DIAGNOSIS — R0781 Pleurodynia: Secondary | ICD-10-CM | POA: Diagnosis not present

## 2022-09-03 MED ORDER — CYCLOBENZAPRINE HCL 5 MG PO TABS: 5.0000 mg | ORAL_TABLET | Freq: Two times a day (BID) | ORAL | 0 refills | Status: AC | PRN

## 2022-09-03 NOTE — ED Provider Notes (Signed)
[facility]    CSN: [id number] Arrival date & time: 09/03/22  1121      History   Chief Complaint Chief Complaint  Patient presents with   Cough    HPI Melinda Thompson is a 72 y.o. female.  Here with left low back pain Saturday she fell at work, hit left back on something She has occasional cough which flares up the back pain Has tried 1 advil, half a norco, and a quarter of muscle relaxer Used delsym for cough that helped  Denies midline/spinal pain. No bladder or bowel dysfunction No N/T or weakness of extremities. 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 stent done in office 1980's    OB History   No obstetric history on file.      Home Medications    Prior to Admission medications   Medication Sig Start Date End Date Taking? Authorizing Provider  acetaminophen (TYLENOL) 500 MG tablet Take 1,000 mg by mouth every 6 (six) hours as needed for moderate pain.   Yes [provider]  cyclobenzaprine (FLEXERIL) 5 MG tablet Take 1 tablet (5 mg total) by mouth 2 (two) times daily as needed for muscle spasms. 09/03/22   Yes Ronnisha Felber, RWells Guiles PA-C  multivitamin-iron-minerals-folic acid (CENTRUM) chewable tablet Chew 1 tablet by mouth every morning.   Yes [provider]  sertraline (ZOLOFT) 25 MG tablet Take 12.5 mg by mouth at bedtime.    Yes [provider]    Family History Family History  Problem Relation Age of Onset   COPD Mother    Osteoporosis Mother    Emphysema Mother    Hypertension Father    COPD Father     Social History Social History   Tobacco Use   Smoking status: Never   Smokeless tobacco: Never  Vaping Use   Vaping Use: Never used  Substance Use Topics   Alcohol use: Not Currently    Comment: rarely   Drug use: No     Allergies   Sulfonamide derivatives   Review of Systems Review of Systems  Respiratory:  Positive for cough.      Physical Exam Triage Vital Signs ED Triage Vitals  Enc Vitals Group     BP 09/03/22 1140 (!) 181/82     Pulse Rate 09/03/22 1140 68     Resp 09/03/22 1140 18     Temp 09/03/22 1140 98.2 F (36.8 C)     Temp Source 09/03/22 1140 Oral  SpO2 09/03/22 1140 95 %     Weight 09/03/22 1142 187 lb (84.8 kg)     Height 09/03/22 1142 5' 4.5" (1.638 m)     Head Circumference --      Peak Flow --      Pain Score 09/03/22 1142 5     Pain Loc --      Pain Edu? --      Excl. in [place]? --    No data found.  Updated Vital Signs BP (!) 181/82 (BP Location: Right Arm)   Pulse 68   Temp 98.2 F (36.8 C) (Oral)   Resp 18   Ht 5' 4.5" (1.638 m)   Wt 187 lb (84.8 kg)   SpO2 95%   BMI 31.60 kg/m    Physical Exam Vitals and nursing note reviewed.  Constitutional:      General: She is not in acute distress. HENT:     Nose: No congestion or rhinorrhea.     Mouth/Throat:     Mouth: Mucous membranes are moist.     Pharynx: Oropharynx is clear. No posterior oropharyngeal erythema.  Eyes:     Conjunctiva/sclera: Conjunctivae normal.  Cardiovascular:     Rate and Rhythm: Normal rate and regular rhythm.     Pulses:  Normal pulses.     Heart sounds: Normal heart sounds.  Pulmonary:     Effort: Pulmonary effort is normal. No respiratory distress.     Breath sounds: Normal breath sounds. No wheezing or rales.  Musculoskeletal:     Cervical back: Normal range of motion.     Comments: Left mid-lower back tender to palpation. No bruising or rash. No bony tenderness T or L spine  Skin:    General: Skin is warm and dry.  Neurological:     Mental Status: She is alert and oriented to person, place, and time.     UC Treatments / Results  Labs (all labs ordered are listed, but only abnormal results are displayed) Labs Reviewed - No data to display  EKG   Radiology DG Ribs Unilateral W/Chest Left  Result Date: 09/03/2022 CLINICAL DATA:  Left rib pain after fall 3 days ago. EXAM: LEFT RIBS AND CHEST - 3+ VIEW COMPARISON:  April 21, 2017. FINDINGS: No fracture or other bone lesions are seen involving the ribs. There is no evidence of pneumothorax or pleural effusion. Both lungs are clear. Heart size and mediastinal contours are within normal limits. IMPRESSION: Negative. Electronically Signed   By: Marijo Conception M.D.   On: 09/03/2022 12:37    Procedures Procedures (including critical care time)  Medications Ordered in UC Medications - No data to display  Initial Impression / Assessment and Plan / UC Course  I have reviewed the triage vital signs and the nursing notes.  Pertinent labs & imaging results that were available during my care of the patient were reviewed by me and considered in my medical decision making (see chart for details).  Xray left ribs negative Likely soft tissue/muscle injury Recommend to use ibuprofen, decreased dose muscle relaxer, hot pad Continue delsym for cough Return precautions discussed. Patient agrees to plan  Final Clinical Impressions(s) / UC Diagnoses   Final diagnoses:  Left paraspinal back pain  Acute cough     Discharge Instructions      Your x-ray  is negative.  As discussed I believe you have a soft tissue injury such as a muscle strain or bruise. This is treated with symptomatic care.  Please use ibuprofen 3 times daily as needed.  You can take the muscle relaxer twice daily.  If this makes you drowsy, take only at bedtime. Continue hot pad, or ice, if that is more helpful It may take several days for pain to resolve. Continue delsym for cough     ED Prescriptions     Medication Sig Dispense Auth. Provider   cyclobenzaprine (FLEXERIL) 5 MG tablet Take 1 tablet (5 mg total) by mouth 2 (two) times daily as needed for muscle spasms. 10 tablet Zakhia Seres, Wells Guiles, PA-C      PDMP not reviewed this encounter.   [place], [place] 09/03/22 1242

## 2022-09-03 NOTE — ED Triage Notes (Signed)
Patient c/o productive cough that comes and goes.  She does have some green phlegm come up, thinking it's her allergies.  Patient has taken Advil for sinus pressure. Patient did injure the left side of her back and she coughed one time and felt like a knife was in her back.  Patient has taken Robaxin for back pain.

## 2022-09-03 NOTE — Discharge Instructions (Addendum)
Your x-ray is negative.  As discussed I believe you have a soft tissue injury such as a muscle strain or bruise. This is treated with symptomatic care.  Please use ibuprofen 3 times daily as needed.  You can take the muscle relaxer twice daily.  If this makes you drowsy, take only at bedtime. Continue hot pad, or ice, if that is more helpful It may take several days for pain to resolve. Continue delsym for cough

## 2022-09-10 DIAGNOSIS — R051 Acute cough: Secondary | ICD-10-CM | POA: Diagnosis not present

## 2022-10-16 DIAGNOSIS — K219 Gastro-esophageal reflux disease without esophagitis: Secondary | ICD-10-CM | POA: Diagnosis not present

## 2022-10-16 DIAGNOSIS — F3342 Major depressive disorder, recurrent, in full remission: Secondary | ICD-10-CM | POA: Diagnosis not present

## 2022-10-16 DIAGNOSIS — R32 Unspecified urinary incontinence: Secondary | ICD-10-CM | POA: Diagnosis not present

## 2022-10-16 DIAGNOSIS — Z791 Long term (current) use of non-steroidal anti-inflammatories (NSAID): Secondary | ICD-10-CM | POA: Diagnosis not present

## 2022-10-16 DIAGNOSIS — B009 Herpesviral infection, unspecified: Secondary | ICD-10-CM | POA: Diagnosis not present

## 2022-10-16 DIAGNOSIS — E669 Obesity, unspecified: Secondary | ICD-10-CM | POA: Diagnosis not present

## 2022-10-16 DIAGNOSIS — J45909 Unspecified asthma, uncomplicated: Secondary | ICD-10-CM | POA: Diagnosis not present

## 2022-10-16 DIAGNOSIS — N182 Chronic kidney disease, stage 2 (mild): Secondary | ICD-10-CM | POA: Diagnosis not present

## 2022-10-16 DIAGNOSIS — I129 Hypertensive chronic kidney disease with stage 1 through stage 4 chronic kidney disease, or unspecified chronic kidney disease: Secondary | ICD-10-CM | POA: Diagnosis not present

## 2022-10-16 DIAGNOSIS — E785 Hyperlipidemia, unspecified: Secondary | ICD-10-CM | POA: Diagnosis not present

## 2022-10-16 DIAGNOSIS — I4891 Unspecified atrial fibrillation: Secondary | ICD-10-CM | POA: Diagnosis not present

## 2022-10-16 DIAGNOSIS — M1990 Unspecified osteoarthritis, unspecified site: Secondary | ICD-10-CM | POA: Diagnosis not present

## 2022-10-17 DIAGNOSIS — Z6837 Body mass index (BMI) 37.0-37.9, adult: Secondary | ICD-10-CM | POA: Diagnosis not present

## 2022-10-17 DIAGNOSIS — M25561 Pain in right knee: Secondary | ICD-10-CM | POA: Diagnosis not present

## 2022-10-17 DIAGNOSIS — Z96641 Presence of right artificial hip joint: Secondary | ICD-10-CM | POA: Diagnosis not present

## 2022-10-17 DIAGNOSIS — E6609 Other obesity due to excess calories: Secondary | ICD-10-CM | POA: Diagnosis not present

## 2022-10-17 DIAGNOSIS — M25551 Pain in right hip: Secondary | ICD-10-CM | POA: Diagnosis not present

## 2022-10-17 DIAGNOSIS — M25562 Pain in left knee: Secondary | ICD-10-CM | POA: Diagnosis not present

## 2022-10-31 DIAGNOSIS — Z471 Aftercare following joint replacement surgery: Secondary | ICD-10-CM | POA: Diagnosis not present

## 2022-10-31 DIAGNOSIS — Z96643 Presence of artificial hip joint, bilateral: Secondary | ICD-10-CM | POA: Diagnosis not present

## 2022-10-31 DIAGNOSIS — M419 Scoliosis, unspecified: Secondary | ICD-10-CM | POA: Diagnosis not present

## 2022-11-06 DIAGNOSIS — X58XXXD Exposure to other specified factors, subsequent encounter: Secondary | ICD-10-CM | POA: Diagnosis not present

## 2022-11-06 DIAGNOSIS — S32010D Wedge compression fracture of first lumbar vertebra, subsequent encounter for fracture with routine healing: Secondary | ICD-10-CM | POA: Diagnosis not present

## 2022-11-14 DIAGNOSIS — Z96643 Presence of artificial hip joint, bilateral: Secondary | ICD-10-CM | POA: Diagnosis not present

## 2022-11-15 DIAGNOSIS — L814 Other melanin hyperpigmentation: Secondary | ICD-10-CM | POA: Diagnosis not present

## 2022-11-15 DIAGNOSIS — D229 Melanocytic nevi, unspecified: Secondary | ICD-10-CM | POA: Diagnosis not present

## 2022-11-15 DIAGNOSIS — L821 Other seborrheic keratosis: Secondary | ICD-10-CM | POA: Diagnosis not present

## 2022-11-15 DIAGNOSIS — D1801 Hemangioma of skin and subcutaneous tissue: Secondary | ICD-10-CM | POA: Diagnosis not present

## 2022-11-15 DIAGNOSIS — D2339 Other benign neoplasm of skin of other parts of face: Secondary | ICD-10-CM | POA: Diagnosis not present

## 2022-11-19 DIAGNOSIS — Z96643 Presence of artificial hip joint, bilateral: Secondary | ICD-10-CM | POA: Diagnosis not present

## 2022-12-30 DIAGNOSIS — I87391 Chronic venous hypertension (idiopathic) with other complications of right lower extremity: Secondary | ICD-10-CM | POA: Diagnosis not present

## 2022-12-30 DIAGNOSIS — I83891 Varicose veins of right lower extremities with other complications: Secondary | ICD-10-CM | POA: Diagnosis not present

## 2023-01-16 DIAGNOSIS — I83891 Varicose veins of right lower extremities with other complications: Secondary | ICD-10-CM | POA: Diagnosis not present

## 2023-01-16 DIAGNOSIS — I87391 Chronic venous hypertension (idiopathic) with other complications of right lower extremity: Secondary | ICD-10-CM | POA: Diagnosis not present

## 2023-03-20 DIAGNOSIS — R600 Localized edema: Secondary | ICD-10-CM | POA: Diagnosis not present

## 2023-03-20 DIAGNOSIS — I83892 Varicose veins of left lower extremities with other complications: Secondary | ICD-10-CM | POA: Diagnosis not present

## 2023-03-20 DIAGNOSIS — I87393 Chronic venous hypertension (idiopathic) with other complications of bilateral lower extremity: Secondary | ICD-10-CM | POA: Diagnosis not present

## 2023-03-20 DIAGNOSIS — R252 Cramp and spasm: Secondary | ICD-10-CM | POA: Diagnosis not present

## 2023-03-20 DIAGNOSIS — G2581 Restless legs syndrome: Secondary | ICD-10-CM | POA: Diagnosis not present

## 2023-04-28 DIAGNOSIS — I87392 Chronic venous hypertension (idiopathic) with other complications of left lower extremity: Secondary | ICD-10-CM | POA: Diagnosis not present

## 2023-05-05 DIAGNOSIS — Z1231 Encounter for screening mammogram for malignant neoplasm of breast: Secondary | ICD-10-CM | POA: Diagnosis not present

## 2023-05-05 DIAGNOSIS — Z2821 Immunization not carried out because of patient refusal: Secondary | ICD-10-CM | POA: Diagnosis not present

## 2023-05-05 DIAGNOSIS — N951 Menopausal and female climacteric states: Secondary | ICD-10-CM | POA: Diagnosis not present

## 2023-05-05 DIAGNOSIS — Z0000 Encounter for general adult medical examination without abnormal findings: Secondary | ICD-10-CM | POA: Diagnosis not present

## 2023-05-05 DIAGNOSIS — E559 Vitamin D deficiency, unspecified: Secondary | ICD-10-CM | POA: Diagnosis not present

## 2023-05-05 DIAGNOSIS — N3941 Urge incontinence: Secondary | ICD-10-CM | POA: Diagnosis not present

## 2023-05-05 DIAGNOSIS — E785 Hyperlipidemia, unspecified: Secondary | ICD-10-CM | POA: Diagnosis not present

## 2023-05-05 DIAGNOSIS — F325 Major depressive disorder, single episode, in full remission: Secondary | ICD-10-CM | POA: Diagnosis not present

## 2023-05-12 DIAGNOSIS — I87392 Chronic venous hypertension (idiopathic) with other complications of left lower extremity: Secondary | ICD-10-CM | POA: Diagnosis not present

## 2023-06-18 DIAGNOSIS — H8309 Labyrinthitis, unspecified ear: Secondary | ICD-10-CM | POA: Diagnosis not present

## 2023-06-18 DIAGNOSIS — R42 Dizziness and giddiness: Secondary | ICD-10-CM | POA: Diagnosis not present

## 2023-07-21 DIAGNOSIS — Z0100 Encounter for examination of eyes and vision without abnormal findings: Secondary | ICD-10-CM | POA: Diagnosis not present

## 2023-08-25 DIAGNOSIS — M545 Low back pain, unspecified: Secondary | ICD-10-CM | POA: Diagnosis not present

## 2023-08-26 DIAGNOSIS — I480 Paroxysmal atrial fibrillation: Secondary | ICD-10-CM | POA: Diagnosis not present

## 2023-08-26 DIAGNOSIS — Z1330 Encounter for screening examination for mental health and behavioral disorders, unspecified: Secondary | ICD-10-CM | POA: Diagnosis not present

## 2023-09-08 DIAGNOSIS — G2581 Restless legs syndrome: Secondary | ICD-10-CM | POA: Diagnosis not present

## 2023-09-08 DIAGNOSIS — R252 Cramp and spasm: Secondary | ICD-10-CM | POA: Diagnosis not present

## 2023-09-08 DIAGNOSIS — R600 Localized edema: Secondary | ICD-10-CM | POA: Diagnosis not present

## 2023-09-08 DIAGNOSIS — I87393 Chronic venous hypertension (idiopathic) with other complications of bilateral lower extremity: Secondary | ICD-10-CM | POA: Diagnosis not present

## 2023-09-08 DIAGNOSIS — I83892 Varicose veins of left lower extremities with other complications: Secondary | ICD-10-CM | POA: Diagnosis not present

## 2023-09-29 DIAGNOSIS — M47816 Spondylosis without myelopathy or radiculopathy, lumbar region: Secondary | ICD-10-CM | POA: Diagnosis not present

## 2023-09-29 DIAGNOSIS — I87392 Chronic venous hypertension (idiopathic) with other complications of left lower extremity: Secondary | ICD-10-CM | POA: Diagnosis not present

## 2023-10-27 ENCOUNTER — Ambulatory Visit
Admission: RE | Admit: 2023-10-27 | Discharge: 2023-10-27 | Disposition: A | Discharge status: Home / Self Care | Source: Ambulatory Visit | Attending: Family Medicine | Admitting: Family Medicine

## 2023-10-27 VITALS — BP 144/87 | HR 68 | Temp 98.3°F | Resp 16

## 2023-10-27 DIAGNOSIS — R059 Cough, unspecified: Secondary | ICD-10-CM

## 2023-10-27 DIAGNOSIS — J069 Acute upper respiratory infection, unspecified: Secondary | ICD-10-CM

## 2023-10-27 DIAGNOSIS — S70261A Insect bite (nonvenomous), right hip, initial encounter: Secondary | ICD-10-CM

## 2023-10-27 DIAGNOSIS — W57XXXA Bitten or stung by nonvenomous insect and other nonvenomous arthropods, initial encounter: Secondary | ICD-10-CM | POA: Diagnosis not present

## 2023-10-27 MED ORDER — PREDNISONE 20 MG PO TABS: ORAL_TABLET | ORAL | 0 refills | Status: AC

## 2023-10-27 MED ORDER — DOXYCYCLINE HYCLATE 100 MG PO CAPS: 100.0000 mg | ORAL_CAPSULE | Freq: Two times a day (BID) | ORAL | 0 refills | Status: AC

## 2023-10-27 NOTE — ED Provider Notes (Signed)
 [facility]    CSN: [id number] Arrival date & time: 10/27/23  1541      History   Chief Complaint Chief Complaint  Patient presents with   Cough    HPI Melinda Thompson is a 73 y.o. female.   HPI Very pleasant 73 year old female presents with nonproductive cough for 3 days.  Additionally, patient reports tick bite to the right hip that she noticed 2 days ago.  PMH significant for PAF, chronic venous hypertension, and labyrinthitis.   Past Medical History:  Diagnosis Date   Anxiety    Arthritis    osteoarthritis   GERD (gastroesophageal reflux disease)    Headache    migraines   History of hiatal hernia     Patient Active Problem List   Diagnosis Date Noted   Failed total hip arthroplasty ([facility]) 11/15/2019   IRRITABLE BOWEL SYNDROME 03/16/2010   Diaphragmatic hernia 02/12/2010   DEPRESSION 01/04/2010   GERD 01/04/2010   Constipation 01/04/2010   CHEST PAIN 01/04/2010   PERSONAL HISTORY OF ARTHRITIS 01/04/2010    Past Surgical History:  Procedure Laterality Date   JOINT REPLACEMENT     bil hip   TOTAL HIP ARTHROPLASTY     bil   TOTAL HIP REVISION Right 11/15/2019   Procedure: TOTAL HIP REVISION;  Surgeon: Liliane Rei, MD;  Location: [facility];  Service: Orthopedics;  Laterality: Right;    vein surgey      inner thigh left leg has a ? stent done in office 1980's    OB History   No obstetric history on file.      Home Medications    Prior to Admission medications   Medication Sig Start Date End Date Taking? Authorizing Provider  doxycycline  (VIBRAMYCIN ) 100 MG capsule Take 1 capsule (100 mg total) by mouth 2 (two) times daily for 10 days. 10/27/23 11/06/23 Yes Leonides Ramp, FNP  predniSONE  (DELTASONE ) 20 MG tablet Take 3 tabs PO daily x 5 days. 10/27/23  Yes Leonides Ramp, FNP  acetaminophen  (TYLENOL ) 500 MG tablet Take 1,000 mg by mouth every 6 (six) hours as needed for moderate pain.    [provider]  cyclobenzaprine   (FLEXERIL ) 5 MG tablet Take 1 tablet (5 mg total) by mouth 2 (two) times daily as needed for muscle spasms. 09/03/22   Rising, Ivette Marks, PA-C  multivitamin-iron-minerals-folic acid (CENTRUM) chewable tablet Chew 1 tablet by mouth every morning.    [provider]  sertraline  (ZOLOFT ) 25 MG tablet Take 12.5 mg by mouth at bedtime.     [provider]    Family History Family History  Problem Relation Age of Onset   COPD Mother    Osteoporosis Mother    Emphysema Mother    Hypertension Father    COPD Father     Social History Social History   Tobacco Use   Smoking status: Never   Smokeless tobacco: Never  Vaping Use   Vaping status: Never Used  Substance Use Topics   Alcohol use: Not Currently    Comment: rarely   Drug use: No     Allergies   Sulfonamide derivatives   Review of Systems Review of Systems  Respiratory:  Positive for cough.   Skin:  Positive for rash.  All other systems reviewed and are negative.    Physical Exam Triage Vital Signs ED Triage Vitals  Encounter Vitals Group     BP      Systolic BP Percentile  Diastolic BP Percentile      Pulse      Resp      Temp      Temp src      SpO2      Weight      Height      Head Circumference      Peak Flow      Pain Score      Pain Loc      Pain Education      Exclude from Growth Chart    No data found.  Updated Vital Signs BP (!) 144/87   Pulse 68   Temp 98.3 F (36.8 C)   Resp 16   SpO2 98%    Physical Exam Vitals and nursing note reviewed.  Constitutional:      Appearance: Normal appearance. She is obese.  HENT:     Head: Normocephalic and atraumatic.     Right Ear: Tympanic membrane, ear canal and external ear normal.     Left Ear: Tympanic membrane, ear canal and external ear normal.     Mouth/Throat:     Mouth: Mucous membranes are moist.     Pharynx: Oropharynx is clear.  Eyes:     Extraocular Movements: Extraocular movements intact.      Conjunctiva/sclera: Conjunctivae normal.     Pupils: Pupils are equal, round, and reactive to light.  Cardiovascular:     Rate and Rhythm: Normal rate and regular rhythm.     Pulses: Normal pulses.     Heart sounds: Normal heart sounds.  Pulmonary:     Effort: Pulmonary effort is normal.     Breath sounds: Normal breath sounds. No wheezing, rhonchi or rales.  Musculoskeletal:        General: Normal range of motion.     Cervical back: Normal range of motion and neck supple.  Skin:    General: Skin is warm and dry.     Comments: Right hip inferior aspect:~0.5 cm circular shaped area of erythema, nonindurated, nonfluctuant, no lymphatic streaking, no targetoid lesion  Neurological:     General: No focal deficit present.     Mental Status: She is alert and oriented to person, place, and time.  Psychiatric:        Mood and Affect: Mood normal.        Behavior: Behavior normal.      UC Treatments / Results  Labs (all labs ordered are listed, but only abnormal results are displayed) Labs Reviewed - No data to display  EKG   Radiology No results found.  Procedures Procedures (including critical care time)  Medications Ordered in UC Medications - No data to display  Initial Impression / Assessment and Plan / UC Course  I have reviewed the triage vital signs and the nursing notes.  Pertinent labs & imaging results that were available during my care of the patient were reviewed by me and considered in my medical decision making (see chart for details).     MDM: 1.  Acute URI-Rx'd doxycycline  100 mg capsule: Take 1 capsule twice daily x 10 days; 2.  Cough, unspecified type-Rx'd prednisone  20 mg tablet: Take 3 tablets p.o. daily x 5 days; 3.  Tick bite of right hip, initial encounter-Rx'd doxycycline  100 mg capsule: Take 1 capsule twice daily x 10 days. Advised patient to take medication as directed with food to completion.  Advised patient to take prednisone  with first dose of  doxycycline  for the next 5 of 10 days.  Encouraged increase daily water  intake to 64 ounces per day while taking these medications.  Advised if symptoms worsen and/or unresolved please follow-up with your PCP or here for further evaluation.  Patient discharged home, hemodynamically stable. Final Clinical Impressions(s) / UC Diagnoses   Final diagnoses:  Tick bite of right hip, initial encounter  Acute URI  Cough, unspecified type     Discharge Instructions      Advised patient to take medication as directed with food to completion.  Advised patient to take prednisone  with first dose of doxycycline  for the next 5 of 10 days.  Encouraged increase daily water  intake to 64 ounces per day while taking these medications.  Advised if symptoms worsen and/or unresolved please follow-up with your PCP or here for further evaluation.     ED Prescriptions     Medication Sig Dispense Auth. Provider   doxycycline  (VIBRAMYCIN ) 100 MG capsule Take 1 capsule (100 mg total) by mouth 2 (two) times daily for 10 days. 20 capsule Suliman Termini, FNP   predniSONE  (DELTASONE ) 20 MG tablet Take 3 tabs PO daily x 5 days. 15 tablet Emoni Yang, FNP      PDMP not reviewed this encounter.   Leonides Ramp, FNP 10/27/23 1640

## 2023-10-27 NOTE — Discharge Instructions (Addendum)
 Advised patient to take medication as directed with food to completion.  Advised patient to take prednisone  with first dose of doxycycline  for the next 5 of 10 days.  Encouraged increase daily water  intake to 64 ounces per day while taking these medications.  Advised if symptoms worsen and/or unresolved please follow-up with your PCP or here for further evaluation.

## 2023-10-27 NOTE — ED Triage Notes (Signed)
 Pt presents to uc with co cough since Friday. Pt reports working in [employer] ge[employer] and concern for [employer]

## 2023-10-30 ENCOUNTER — Ambulatory Visit: Admission: EM | Admit: 2023-10-30 | Discharge: 2023-10-30 | Disposition: A | Discharge status: Home / Self Care

## 2023-10-30 DIAGNOSIS — S70261D Insect bite (nonvenomous), right hip, subsequent encounter: Secondary | ICD-10-CM

## 2023-10-30 DIAGNOSIS — W57XXXD Bitten or stung by nonvenomous insect and other nonvenomous arthropods, subsequent encounter: Secondary | ICD-10-CM

## 2023-10-30 DIAGNOSIS — S71001D Unspecified open wound, right hip, subsequent encounter: Secondary | ICD-10-CM

## 2023-10-30 NOTE — Discharge Instructions (Addendum)
 Finish your doxycycline .  Take 2 times a day.  Take with food This will prevent any worsening of the skin infection or Lyme's disease

## 2023-10-30 NOTE — ED Triage Notes (Signed)
 Pt c/o tick bite to RT hip since last Sat. Very itchy Neosporin. Was seen here on 4/28. Tx for URI with doxy and prednisone .

## 2023-10-30 NOTE — ED Provider Notes (Signed)
 [facility]    CSN: [id number] Arrival date & time: 10/30/23  1322      History   Chief Complaint Chief Complaint  Patient presents with   Insect Bite    Tick, RT hip    HPI Melinda Thompson is a 73 y.o. female.   Patient was seen on 10/21/2023 for a tick bite.  She also had sinusitis.  The provider at that time chose to treat her with doxycycline  to cover both the sinus infection and any complications from the tick bite.  She continues taking her doxycycline  without difficulty.  The tick bite looks red to her.  The center has a yellow appearance.  She is worried about infection.  She has an artificial hip on the side and is very worried that it could get into her hip.  It also itches.  She would like it rechecked    Past Medical History:  Diagnosis Date   Anxiety    Arthritis    osteoarthritis   GERD (gastroesophageal reflux disease)    Headache    migraines   History of hiatal hernia     Patient Active Problem List   Diagnosis Date Noted   Failed total hip arthroplasty (HCC) 11/15/2019   IRRITABLE BOWEL SYNDROME 03/16/2010   Diaphragmatic hernia 02/12/2010   DEPRESSION 01/04/2010   GERD 01/04/2010   Constipation 01/04/2010   CHEST PAIN 01/04/2010   PERSONAL HISTORY OF ARTHRITIS 01/04/2010    Past Surgical History:  Procedure Laterality Date   JOINT REPLACEMENT     bil hip   TOTAL HIP ARTHROPLASTY     bil   TOTAL HIP REVISION Right 11/15/2019   Procedure: TOTAL HIP REVISION;  Surgeon: Liliane Rei, MD;  Location: [facility];  Service: Orthopedics;  Laterality: Right;    vein surgey      inner thigh left leg has a ? stent done in office 1980's    OB History   No obstetric history on file.      Home Medications    Prior to Admission medications   Medication Sig Start Date End Date Taking? Authorizing Provider  acetaminophen  (TYLENOL ) 500 MG tablet Take 1,000 mg by mouth every 6 (six) hours as needed for moderate pain.    [provider]  cyclobenzaprine  (FLEXERIL ) 5 MG tablet Take 1 tablet (5 mg total) by mouth 2 (two) times daily as needed for muscle spasms. 09/03/22   Rising, Ivette Marks, PA-C  doxycycline  (VIBRAMYCIN ) 100 MG capsule Take 1 capsule (100 mg total) by mouth 2 (two) times daily for 10 days. 10/27/23 11/06/23  Leonides Ramp, FNP  multivitamin-iron-minerals-folic acid (CENTRUM) chewable tablet Chew 1 tablet by mouth every morning.    [provider]  predniSONE  (DELTASONE ) 20 MG tablet Take 3 tabs PO daily x 5 days. 10/27/23   Leonides Ramp, FNP  sertraline  (ZOLOFT ) 25 MG tablet Take 12.5 mg by mouth at bedtime.     [provider]    Family History Family History  Problem Relation Age of Onset   COPD Mother    Osteoporosis Mother    Emphysema Mother    Hypertension Father    COPD Father     Social History Social History   Tobacco Use   Smoking status: Never   Smokeless tobacco: Never  Vaping Use   Vaping status: Never Used  Substance Use Topics   Alcohol use: Not Currently    Comment: rarely   Drug use: No  Allergies   Sulfonamide derivatives   Review of Systems Review of Systems  See HPI Physical Exam Triage Vital Signs ED Triage Vitals  Encounter Vitals Group     BP 10/30/23 1353 (!) 158/90     Systolic BP Percentile --      Diastolic BP Percentile --      Pulse Rate 10/30/23 1334 78     Resp 10/30/23 1334 17     Temp 10/30/23 1334 98.4 F (36.9 C)     Temp Source 10/30/23 1334 Oral     SpO2 10/30/23 1334 94 %     Weight --      Height --      Head Circumference --      Peak Flow --      Pain Score 10/30/23 1336 1     Pain Loc --      Pain Education --      Exclude from Growth Chart --    No data found.  Updated Vital Signs BP (!) 158/90 (BP Location: Right Arm)   Pulse 78   Temp 98.4 F (36.9 C) (Oral)   Resp 17   SpO2 94%      Physical Exam Constitutional:      General: She is not in acute distress.    Appearance: She is  well-developed and normal weight.  HENT:     Head: Normocephalic and atraumatic.  Eyes:     Conjunctiva/sclera: Conjunctivae normal.     Pupils: Pupils are equal, round, and reactive to light.  Cardiovascular:     Rate and Rhythm: Normal rate.  Pulmonary:     Effort: Pulmonary effort is normal. No respiratory distress.  Abdominal:     General: There is no distension.     Palpations: Abdomen is soft.  Musculoskeletal:        General: Normal range of motion.     Cervical back: Normal range of motion.  Skin:    General: Skin is warm and dry.     Findings: Lesion present.     Comments: Of the right hip overlying the greater trochanter, overlying the scar from her hip replacement, there is a 1 cm circular lesion that has a deep erythematous periphery.  No induration.  No cellulitis.  The center has a yellowish eschar.  Neurological:     Mental Status: She is alert.      UC Treatments / Results  Labs (all labs ordered are listed, but only abnormal results are displayed) Labs Reviewed - No data to display  EKG   Radiology No results found.  Procedures Procedures (including critical care time)  Medications Ordered in UC Medications - No data to display  Initial Impression / Assessment and Plan / UC Course  I have reviewed the triage vital signs and the nursing notes.  Pertinent labs & imaging results that were available during my care of the patient were reviewed by me and considered in my medical decision making (see chart for details).     I explained to her that this is normal healing from a tick bite.  In addition while she is on the antibiotic she does not have to worry about infection. Final Clinical Impressions(s) / UC Diagnoses   Final diagnoses:  Open wound of right hip, subsequent encounter  Tick bite of right hip, subsequent encounter     Discharge Instructions      Finish your doxycycline .  Take 2 times a day.  Take with food This  will prevent any  worsening of the skin infection or Lyme's disease   ED Prescriptions   None    PDMP not reviewed this encounter.   Stephany Ehrich, MD 10/30/23 [phone]

## 2023-11-06 DIAGNOSIS — M79675 Pain in left toe(s): Secondary | ICD-10-CM | POA: Diagnosis not present

## 2023-11-06 DIAGNOSIS — M1811 Unilateral primary osteoarthritis of first carpometacarpal joint, right hand: Secondary | ICD-10-CM | POA: Diagnosis not present

## 2023-11-06 DIAGNOSIS — M79641 Pain in right hand: Secondary | ICD-10-CM | POA: Diagnosis not present

## 2023-11-06 DIAGNOSIS — M79674 Pain in right toe(s): Secondary | ICD-10-CM | POA: Diagnosis not present

## 2023-11-06 DIAGNOSIS — M79644 Pain in right finger(s): Secondary | ICD-10-CM | POA: Diagnosis not present

## 2023-11-06 DIAGNOSIS — B351 Tinea unguium: Secondary | ICD-10-CM | POA: Diagnosis not present

## 2023-11-10 DIAGNOSIS — L608 Other nail disorders: Secondary | ICD-10-CM | POA: Diagnosis not present

## 2023-11-10 DIAGNOSIS — B351 Tinea unguium: Secondary | ICD-10-CM | POA: Diagnosis not present

## 2023-11-10 DIAGNOSIS — L600 Ingrowing nail: Secondary | ICD-10-CM | POA: Diagnosis not present

## 2023-11-17 DIAGNOSIS — M1811 Unilateral primary osteoarthritis of first carpometacarpal joint, right hand: Secondary | ICD-10-CM | POA: Diagnosis not present

## 2023-12-01 DIAGNOSIS — Z471 Aftercare following joint replacement surgery: Secondary | ICD-10-CM | POA: Diagnosis not present

## 2023-12-01 DIAGNOSIS — Z96643 Presence of artificial hip joint, bilateral: Secondary | ICD-10-CM | POA: Diagnosis not present

## 2023-12-04 DIAGNOSIS — Z7982 Long term (current) use of aspirin: Secondary | ICD-10-CM | POA: Diagnosis not present

## 2023-12-04 DIAGNOSIS — Z8249 Family history of ischemic heart disease and other diseases of the circulatory system: Secondary | ICD-10-CM | POA: Diagnosis not present

## 2023-12-04 DIAGNOSIS — M1990 Unspecified osteoarthritis, unspecified site: Secondary | ICD-10-CM | POA: Diagnosis not present

## 2023-12-04 DIAGNOSIS — D6869 Other thrombophilia: Secondary | ICD-10-CM | POA: Diagnosis not present

## 2023-12-04 DIAGNOSIS — M62838 Other muscle spasm: Secondary | ICD-10-CM | POA: Diagnosis not present

## 2023-12-04 DIAGNOSIS — E669 Obesity, unspecified: Secondary | ICD-10-CM | POA: Diagnosis not present

## 2023-12-04 DIAGNOSIS — K219 Gastro-esophageal reflux disease without esophagitis: Secondary | ICD-10-CM | POA: Diagnosis not present

## 2023-12-04 DIAGNOSIS — Z791 Long term (current) use of non-steroidal anti-inflammatories (NSAID): Secondary | ICD-10-CM | POA: Diagnosis not present

## 2023-12-04 DIAGNOSIS — N3941 Urge incontinence: Secondary | ICD-10-CM | POA: Diagnosis not present

## 2023-12-04 DIAGNOSIS — M4800 Spinal stenosis, site unspecified: Secondary | ICD-10-CM | POA: Diagnosis not present

## 2023-12-04 DIAGNOSIS — I4891 Unspecified atrial fibrillation: Secondary | ICD-10-CM | POA: Diagnosis not present

## 2023-12-04 DIAGNOSIS — N182 Chronic kidney disease, stage 2 (mild): Secondary | ICD-10-CM | POA: Diagnosis not present

## 2023-12-16 DIAGNOSIS — L600 Ingrowing nail: Secondary | ICD-10-CM | POA: Diagnosis not present

## 2023-12-16 DIAGNOSIS — B351 Tinea unguium: Secondary | ICD-10-CM | POA: Diagnosis not present

## 2023-12-16 DIAGNOSIS — L608 Other nail disorders: Secondary | ICD-10-CM | POA: Diagnosis not present

## 2023-12-29 DIAGNOSIS — M1811 Unilateral primary osteoarthritis of first carpometacarpal joint, right hand: Secondary | ICD-10-CM | POA: Diagnosis not present

## 2024-01-22 DIAGNOSIS — Z96641 Presence of right artificial hip joint: Secondary | ICD-10-CM | POA: Diagnosis not present

## 2024-01-22 DIAGNOSIS — M545 Low back pain, unspecified: Secondary | ICD-10-CM | POA: Diagnosis not present

## 2024-02-03 DIAGNOSIS — M7918 Myalgia, other site: Secondary | ICD-10-CM | POA: Diagnosis not present

## 2024-02-03 DIAGNOSIS — M47816 Spondylosis without myelopathy or radiculopathy, lumbar region: Secondary | ICD-10-CM | POA: Diagnosis not present

## 2024-02-11 NOTE — Therapy (Signed)
 OUTPATIENT PHYSICAL THERAPY THORACOLUMBAR EVALUATION   Patient Name: Melinda Thompson MRN: [id number] DOB:[birth year], 73 y.o., female Today's Date: 02/12/2024  END OF SESSION:  PT End of Session - 02/12/24 1007     Visit Number 1    Number of Visits 16    Date for PT Re-Evaluation 04/08/24    Authorization Type [employer] medicare copay $10    Progress Note Due on Visit 10    PT Start Time 0800    PT Stop Time 0845    PT Time Calculation (min) 45 min    Activity Tolerance Patient tolerated treatment well          Past Medical History:  Diagnosis Date   Anxiety    Arthritis    osteoarthritis   GERD (gastroesophageal reflux disease)    Headache    migraines   History of hiatal hernia    Past Surgical History:  Procedure Laterality Date   JOINT REPLACEMENT     bil hip   TOTAL HIP ARTHROPLASTY     bil   TOTAL HIP REVISION Right 11/15/2019   Procedure: TOTAL HIP REVISION;  Surgeon: Melodi Lerner, MD;  Location: [facility];  Service: Orthopedics;  Laterality: Right;    vein surgey      inner thigh left leg has a ? 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   IRRITABLE BOWEL SYNDROME 03/16/2010   Diaphragmatic hernia 02/12/2010   DEPRESSION 01/04/2010   GERD 01/04/2010   Constipation 01/04/2010   CHEST PAIN 01/04/2010   PERSONAL HISTORY OF ARTHRITIS 01/04/2010    PCP: Dr Almarie Das   REFERRING PROVIDER: Dr Ozell JONETTA Finney  REFERRING DIAG: Lumbar spondylosis   Rationale for Evaluation and Treatment: Rehabilitation  THERAPY DIAG:  Lumbar spondylosis  Other symptoms and signs involving the musculoskeletal system  Muscle weakness (generalized)  ONSET DATE: 11/13/23  SUBJECTIVE:                                                                                                                                                                                            SUBJECTIVE STATEMENT: Patient reports that she has had back pain for the past three months. She was going out the back steps at home, walking fast and shooed a cat from her car. She noticed that her R foot turned in and she felt pain in the R LB. She had difficulty walking without her foot turning in. Symptoms have continued. She has seen the orthopedist and hip xrays are fine. She feels that  her back is not in alignment. Did have a root canal two days ago which increased symptoms.   PERTINENT HISTORY:  S/P R THA with continued LBP; chronic LBP; arthritis; diaphragmatic hernia 2011; IBS 2011; degenerative spondylolisthesis; R THA 09/14/19; revisiton R THA 11/30/19; bilat knee pain; lumbar spondylosis; mild scoliosis; myofacial pain   PAIN:  Are you having pain? Yes: NPRS scale: 3-4/10 Pain location: R LB area Pain description: dull burning aching  Aggravating factors: sitting  Relieving factors: TENS; meds; K tape   PRECAUTIONS: None  RED FLAGS: None   WEIGHT BEARING RESTRICTIONS: No  FALLS:  Has patient fallen in last 6 months? 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  DIAGNOSTIC FINDINGS:  No recent diagnostic tests available for hip of LB  PATIENT SURVEYS:  PSFS: THE PATIENT SPECIFIC FUNCTIONAL SCALE  Place score of 0-10 (0 = unable to perform activity and 10 = able to perform activity at the same level as before injury or problem)  Activity Date: 02/12/24    Walking > 5-10 min 2    2. Standing from prolonged sitting  3     3. Sleeping  3    4.      Total Score 8      Total Score = Sum of activity scores/number of activities 8/3 = 2.67  Minimally Detectable Change: 3 points (for single activity); 2 points (for average score)  [facility] (nd). The Patient Specific Functional Scale . Retrieved from [email or web address]   COGNITION: Overall cognitive status: Within functional limits for tasks assessed     SENSATION: Numbness R lateral hip since THA   MUSCLE LENGTH: Hamstrings: Right 75 deg; Left 80 deg Tight hip flexors R >> L   POSTURE: rounded shoulders, forward head, increased thoracic kyphosis, and higher hemipelvis R   PALPATION: Tightness  R anterior hip through iliopsoas; posterior hip through the gluts and piriformis; lumbar paraspinals, QL, lats   LUMBAR ROM:   AROM eval  Flexion 95%  Extension 30% tight   Right lateral flexion 75% tight   Left lateral flexion 75% tight   Right rotation 20%  Left rotation 30%   (Blank rows = not tested)  LOWER EXTREMITY ROM:     Active  Right eval Left eval  Hip flexion 90 95  Hip extension 0 10  Hip abduction    Hip adduction    Hip internal rotation 16 50  Hip external rotation 40 34  Knee flexion 90 95  Knee extension    Ankle dorsiflexion    Ankle plantarflexion    Ankle inversion    Ankle eversion     (Blank rows = not tested)  LOWER EXTREMITY MMT:    MMT Right eval Left eval  Hip flexion 3+ 4  Hip extension 3 4-  Hip abduction 3- 4  Hip adduction    Hip internal rotation    Hip external rotation    Knee flexion 4+ 4+  Knee extension 4 4+  Ankle dorsiflexion    Ankle plantarflexion  Ankle inversion    Ankle eversion     (Blank rows = not tested)  LUMBAR SPECIAL TESTS:  Straight leg raise test: Negative, Slump test: Negative   FUNCTIONAL TESTS:  5 times sit to stand:  SLS - unable to stand on R independently; 10 sec L   GAIT: Distance walked:  40 feet Assistive device utilized: None Level of assistance: Complete Independence Comments: antalgic gait with decreased WB R LE in stance phase; trunk asymmetry   TREATMENT DATE: 02/12/24                                                                                                                                 PATIENT EDUCATION:  Education details: POC; HEP  Person educated: Patient Education method: Programmer, multimedia, Facilities manager, Actor cues, Verbal cues, and Handouts Education comprehension: verbalized understanding, returned demonstration, verbal cues required, tactile cues required, and needs further education  HOME EXERCISE PROGRAM: Access Code: [email or web address] URL: [email or web address] Date: 02/12/2024 Prepared by: Quintavious Rinck  Exercises - Supine Transversus Abdominis Bracing with Pelvic Floor Contraction  - 2 x daily - 7 x weekly - 1 sets - 10 reps - 10sec  hold - Supine Piriformis Stretch with Leg Straight  - 2 x daily - 7 x weekly - 1 sets - 3 reps - 30 sec  hold - Supine Lower Trunk Rotation  - 2 x daily - 7 x weekly - 1 sets - 3-5 reps - 10-20 sec  hold - Supine Bridge  - 2 x daily - 7 x weekly - 1-2 sets - 10 reps - 5-10 sec  hold  ASSESSMENT:  CLINICAL IMPRESSION: Patient is a 73 y.o. female who was seen today for physical therapy evaluation and treatment for lumbar spondylosis. She has a history of chronic LBP and R > L hip pain following R THA  and revisition 2021. She presents with recent onset of LBP in the past 3 months following an awkward step in [place]. She has poor posture and alignment with standing leg length difference R hemipelvis higher than L; limited trunk and LR mobility, especially R LE; weakness through core and R > L LEs; abnormal gait pattern; muscular tightness to palpation; radicular pain R > L LE; limited functional activity tolerance; pain with activities. Patient will benefit from PT to address problems identified.   OBJECTIVE  IMPAIRMENTS: Abnormal gait, decreased activity tolerance, decreased balance, decreased mobility, difficulty walking, decreased ROM, decreased strength, hypomobility, increased fascial restrictions, improper body mechanics, postural dysfunction, and pain.   ACTIVITY LIMITATIONS: carrying, lifting, bending, sitting, standing, squatting, sleeping, stairs, and locomotion level  PARTICIPATION LIMITATIONS: meal prep, cleaning, laundry, driving, shopping, community activity, occupation, and yard work  PERSONAL FACTORS: Behavior pattern, Fitness, Past/current experiences, and Time since onset of injury/illness/exacerbation are also affecting patient's functional outcome.   REHAB POTENTIAL: Good  CLINICAL DECISION MAKING: Evolving/moderate complexity  EVALUATION COMPLEXITY: Moderate   GOALS: Goals reviewed with patient? Yes  SHORT  TERM GOALS: Target date: 03/11/2024   Independent in initial HEP  Baseline: Goal status: INITIAL  2.  Patient reports 50% improvement in back pain, allowing her to progress with core stabilization and strengthening and LE strengthening Baseline:  Goal status: INITIAL    LONG TERM GOALS: Target date: 04/08/2024   Increase LE strength to 4/5 to 5/5  Baseline:  Goal status: INITIAL  2.  Patient reports ability to stand and walk for 15-20 min with minimal to no increase in pain  Baseline:  Goal status: INITIAL  3.  Patient reports ability to stand from sitting without increased pain  Baseline:  Goal status: INITIAL  4.  Patient reports improved sleep quality - sleeping for 2-4 hours without awakening due to pain in hips or LB  Baseline:  Goal status: INITIAL  5.  PSFS: THE PATIENT SPECIFIC FUNCTIONAL SCALE  Place score of 0-10 (0 = unable to perform activity and 10 = able to perform activity at the same level as before injury or problem)  Activity Date: 02/12/24    Walking > 5-10 min 2    2. Standing from prolonged sitting  3    3. Sleeping  3     4.      Total Score 8      Total Score = Sum of activity scores/number of activities 8/3 = 2.67  Minimally Detectable Change: 3 points (for single activity); 2 points (for average score) Baseline:  Goal status: INITIAL  6.  Independent in advanced HEP including aquatic program as indicated  Baseline:  Goal status: INITIAL  PLAN:  PT FREQUENCY: 2x/week  PT DURATION: 8 weeks  PLANNED INTERVENTIONS: 97164- PT Re-evaluation, 97110-Therapeutic exercises, 97530- Therapeutic activity, [id number]- Neuromuscular re-education, 97535- Self Care, [id number]- Manual therapy, [phone]- Gait training, [phone]- Aquatic Therapy, Patient/Family education, Balance training, Stair training, Taping, and Joint mobilization.  PLAN FOR NEXT SESSION: review and progress with exercises; continue with spine care and ergonomic education; manual work and modalities as indicated    Brianda Beitler P Jams Trickett, PT 02/12/2024, 12:33 PM

## 2024-02-12 ENCOUNTER — Other Ambulatory Visit: Payer: Self-pay

## 2024-02-12 ENCOUNTER — Encounter: Payer: Self-pay | Admitting: Rehabilitative and Restorative Service Providers"

## 2024-02-12 ENCOUNTER — Ambulatory Visit: Attending: Anesthesiology | Admitting: Rehabilitative and Restorative Service Providers"

## 2024-02-12 DIAGNOSIS — M6281 Muscle weakness (generalized): Secondary | ICD-10-CM | POA: Diagnosis not present

## 2024-02-12 DIAGNOSIS — M47816 Spondylosis without myelopathy or radiculopathy, lumbar region: Secondary | ICD-10-CM | POA: Diagnosis not present

## 2024-02-12 DIAGNOSIS — R29898 Other symptoms and signs involving the musculoskeletal system: Secondary | ICD-10-CM | POA: Diagnosis not present

## 2024-02-17 ENCOUNTER — Ambulatory Visit

## 2024-02-17 DIAGNOSIS — M6281 Muscle weakness (generalized): Secondary | ICD-10-CM

## 2024-02-17 DIAGNOSIS — R29898 Other symptoms and signs involving the musculoskeletal system: Secondary | ICD-10-CM

## 2024-02-17 DIAGNOSIS — M47816 Spondylosis without myelopathy or radiculopathy, lumbar region: Secondary | ICD-10-CM | POA: Diagnosis not present

## 2024-02-17 NOTE — Therapy (Signed)
 OUTPATIENT PHYSICAL THERAPY THORACOLUMBAR TREATMENT   Patient Name: Melinda Thompson MRN: [id number] DOB:1950/07/29, 73 y.o., female Today's Date: 02/17/2024  END OF SESSION:  PT End of Session - 02/17/24 1101     Visit Number 2    Number of Visits 16    Date for PT Re-Evaluation 04/08/24    Authorization Type aetna medicare copay $10    Progress Note Due on Visit 10    PT Start Time 1101    PT Stop Time 1142    PT Time Calculation (min) 41 min    Activity Tolerance Patient tolerated treatment well           Past Medical History:  Diagnosis Date   Anxiety    Arthritis    osteoarthritis   GERD (gastroesophageal reflux disease)    Headache    migraines   History of hiatal hernia    Past Surgical History:  Procedure Laterality Date   JOINT REPLACEMENT     bil hip   TOTAL HIP ARTHROPLASTY     bil   TOTAL HIP REVISION Right 11/15/2019   Procedure: TOTAL HIP REVISION;  Surgeon: Melodi Lerner, MD;  Location: [facility];  Service: Orthopedics;  Laterality: Right;    vein surgey      inner thigh left leg has a ? 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   IRRITABLE BOWEL SYNDROME 03/16/2010   Diaphragmatic hernia 02/12/2010   DEPRESSION 01/04/2010   GERD 01/04/2010   Constipation 01/04/2010   CHEST PAIN 01/04/2010   PERSONAL HISTORY OF ARTHRITIS 01/04/2010    PCP: Dr Almarie Das   REFERRING PROVIDER: Dr Ozell JONETTA Finney  REFERRING DIAG: Lumbar spondylosis   Rationale for Evaluation and Treatment: Rehabilitation  THERAPY DIAG:  Lumbar spondylosis  Other symptoms and signs involving the musculoskeletal system  Muscle weakness (generalized)  ONSET DATE: 11/13/23  SUBJECTIVE:                                                                                                                                                                                            SUBJECTIVE STATEMENT: Patient reports when she sits she feels like there is something hard on the back of the Rt thigh that is painful. She reports the pain and weakness in the Rt leg will make her limp. Right now she isn't having pain, just tightness in the back. She reports HEP is going well.   EVAL: Patient reports that she has had back pain for the past three months. She was  going out the back steps at home, walking fast and shooed a cat from her car. She noticed that her R foot turned in and she felt pain in the R LB. She had difficulty walking without her foot turning in. Symptoms have continued. She has seen the orthopedist and hip xrays are fine. She feels that her back is not in alignment. Did have a root canal two days ago which increased symptoms.   PERTINENT HISTORY:  S/P R THA with continued LBP; chronic LBP; arthritis; diaphragmatic hernia 2011; IBS 2011; degenerative spondylolisthesis; R THA 09/14/19; revisiton R THA 11/30/19; bilat knee pain; lumbar spondylosis; mild scoliosis; myofacial pain   PAIN:  Are you having pain? Yes: NPRS scale: none currently; at worst 7/10 Pain location: Rt low back and Rt posterior thigh  Pain description: feels like long rod  Aggravating factors: sitting  Relieving factors: TENS; meds; K tape   PRECAUTIONS: None  RED FLAGS: None   WEIGHT BEARING RESTRICTIONS: No  FALLS:  Has patient fallen in last 6 months? 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  DIAGNOSTIC FINDINGS:  No recent  diagnostic tests available for hip of LB  PATIENT SURVEYS:  PSFS: THE PATIENT SPECIFIC FUNCTIONAL SCALE  Place score of 0-10 (0 = unable to perform activity and 10 = able to perform activity at the same level as before injury or problem)  Activity Date: 02/12/24    Walking > 5-10 min 2    2. Standing from prolonged sitting  3    3. Sleeping  3    4.      Total Score 8      Total Score = Sum of activity scores/number of activities 8/3 = 2.67  Minimally Detectable Change: 3 points (for single activity); 2 points (for average score)  [facility] (nd). The Patient Specific Functional Scale . Retrieved from [email or web address]   COGNITION: Overall cognitive status: Within functional limits for tasks assessed     SENSATION: Numbness R lateral hip since THA   MUSCLE LENGTH: Hamstrings: Right 75 deg; Left 80 deg Tight hip flexors R >> L   POSTURE: rounded shoulders, forward head, increased thoracic kyphosis, and higher hemipelvis R   PALPATION: Tightness  R anterior hip through iliopsoas; posterior hip through the gluts and piriformis; lumbar paraspinals, QL, lats   LUMBAR ROM:   AROM eval  Flexion 95%  Extension 30% tight   Right lateral flexion 75% tight   Left lateral flexion 75% tight   Right rotation 20%  Left rotation 30%   (Blank rows = not tested)  LOWER EXTREMITY ROM:     Active  Right eval Left eval  Hip flexion 90 95  Hip extension 0 10  Hip abduction    Hip adduction    Hip internal rotation 16 50  Hip external rotation 40 34  Knee flexion 90 95  Knee extension    Ankle dorsiflexion    Ankle plantarflexion    Ankle inversion    Ankle eversion     (Blank rows =  not tested)  LOWER EXTREMITY MMT:    MMT Right eval Left eval  Hip flexion 3+ 4  Hip extension 3 4-  Hip abduction 3- 4  Hip adduction    Hip internal rotation    Hip external rotation    Knee flexion 4+ 4+  Knee  extension 4 4+  Ankle dorsiflexion    Ankle plantarflexion    Ankle inversion    Ankle eversion     (Blank rows = not tested)  LUMBAR SPECIAL TESTS:  Straight leg raise test: Negative, Slump test: Negative   FUNCTIONAL TESTS:  5 times sit to stand:  SLS - unable to stand on R independently; 10 sec L   GAIT: Distance walked: 40 feet Assistive device utilized: None Level of assistance: Complete Independence Comments: antalgic gait with decreased WB R LE in stance phase; trunk asymmetry  [facility] Adult PT Treatment:                                                DATE: 02/17/24 Therapeutic Exercise: LTR x 1 minute  Straight leg piriformis stretch d/c due to poor form Figure 4 x 20 sec each  SKTC x 10 each   Neuromuscular re-ed: Hip bridge 2 x 10  Hooklying TA activation x 10 Supine posterior pelvic tilt 2 x 10  Hooklying resisted hip abduction green band 2 x 10  Hooklying resisted hip flexion with TA engagement; green band 2 x 10   Self Care: Gym recommendations in the community   TREATMENT DATE: 02/12/24                                                                                                                                 PATIENT EDUCATION:  Education details: HEP update  Person educated: Patient Education method: Explanation, Demonstration, Actor cues, Verbal cues, and Handouts Education comprehension: verbalized understanding, returned demonstration, verbal cues required, tactile cues required, and needs further education  HOME EXERCISE PROGRAM: Access Code: [email or web address] URL: [email or web address] Date: 02/17/2024 Prepared by: Lucie Meeter  Exercises - Supine Transversus Abdominis Bracing with Pelvic Floor Contraction  - 2 x daily - 7 x weekly - 1 sets - 10 reps - 10sec  hold - Supine Lower Trunk Rotation  - 2 x daily - 7 x weekly - 1 sets - 3-5 reps - 10-20 sec  hold - Supine Bridge  - 2 x daily - 7 x weekly - 1-2 sets - 10 reps - 5-10 sec   hold - Supine Hip External Rotation Stretch  - 1 x daily - 7 x weekly - 3 sets - 20-30 sec  hold  ASSESSMENT:  CLINICAL IMPRESSION: Reviewed HEP with patient unable to complete prescribed piriformis stretch properly. Modified to figure 4 with patient able to properly stretch, so this was changed  on HEP. Progressed hip and core strengthening today without onset of pain. Cues required for proper core engagement. We discussed gym options in the community providing patient with contact information.   EVAL: Patient is a 73 y.o. female who was seen today for physical therapy evaluation and treatment for lumbar spondylosis. She has a history of chronic LBP and R > L hip pain following R THA  and revisition 2021. She presents with recent onset of LBP in the past 3 months following an awkward step in [place]. She has poor posture and alignment with standing leg length difference R hemipelvis higher than L; limited trunk and LR mobility, especially R LE; weakness through core and R > L LEs; abnormal gait pattern; muscular tightness to palpation; radicular pain R > L LE; limited functional activity tolerance; pain with activities. Patient will benefit from PT to address problems identified.   OBJECTIVE IMPAIRMENTS: Abnormal gait, decreased activity tolerance, decreased balance, decreased mobility, difficulty walking, decreased ROM, decreased strength, hypomobility, increased fascial restrictions, improper body mechanics, postural dysfunction, and pain.   ACTIVITY LIMITATIONS: carrying, lifting, bending, sitting, standing, squatting, sleeping, stairs, and locomotion level  PARTICIPATION LIMITATIONS: meal prep, cleaning, laundry, driving, shopping, community activity, occupation, and yard work  PERSONAL FACTORS: Behavior pattern, Fitness, Past/current experiences, and Time since onset of injury/illness/exacerbation are also affecting patient's functional outcome.   REHAB POTENTIAL: Good  CLINICAL DECISION  MAKING: Evolving/moderate complexity  EVALUATION COMPLEXITY: Moderate   GOALS: Goals reviewed with patient? Yes  SHORT TERM GOALS: Target date: 03/11/2024   Independent in initial HEP  Baseline: Goal status: INITIAL  2.  Patient reports 50% improvement in back pain, allowing her to progress with core stabilization and strengthening and LE strengthening Baseline:  Goal status: INITIAL    LONG TERM GOALS: Target date: 04/08/2024   Increase LE strength to 4/5 to 5/5  Baseline:  Goal status: INITIAL  2.  Patient reports ability to stand and walk for 15-20 min with minimal to no increase in pain  Baseline:  Goal status: INITIAL  3.  Patient reports ability to stand from sitting without increased pain  Baseline:  Goal status: INITIAL  4.  Patient reports improved sleep quality - sleeping for 2-4 hours without awakening due to pain in hips or LB  Baseline:  Goal status: INITIAL  5.  PSFS: THE PATIENT SPECIFIC FUNCTIONAL SCALE  Place score of 0-10 (0 = unable to perform activity and 10 = able to perform activity at the same level as before injury or problem)  Activity Date: 02/12/24    Walking > 5-10 min 2    2. Standing from prolonged sitting  3    3. Sleeping  3    4.      Total Score 8      Total Score = Sum of activity scores/number of activities 8/3 = 2.67  Minimally Detectable Change: 3 points (for single activity); 2 points (for average score) Baseline:  Goal status: INITIAL  6.  Independent in advanced HEP including aquatic program as indicated  Baseline:  Goal status: INITIAL  PLAN:  PT FREQUENCY: 2x/week  PT DURATION: 8 weeks  PLANNED INTERVENTIONS: 97164- PT Re-evaluation, 97110-Therapeutic exercises, 97530- Therapeutic activity, [id number]- Neuromuscular re-education, 97535- Self Care, [id number]- Manual therapy, [id number]- Gait training, [phone]- Aquatic Therapy, Patient/Family education, Balance training, Stair training, Taping, and Joint mobilization.  PLAN  FOR NEXT SESSION: review and progress with exercises; continue with spine care and ergonomic education; manual work and modalities as indicated  Randee Upchurch, PT, DPT, ATC 02/17/24 11:43 AM

## 2024-02-19 ENCOUNTER — Ambulatory Visit

## 2024-02-19 DIAGNOSIS — M47816 Spondylosis without myelopathy or radiculopathy, lumbar region: Secondary | ICD-10-CM | POA: Diagnosis not present

## 2024-02-19 DIAGNOSIS — R29898 Other symptoms and signs involving the musculoskeletal system: Secondary | ICD-10-CM | POA: Diagnosis not present

## 2024-02-19 DIAGNOSIS — M6281 Muscle weakness (generalized): Secondary | ICD-10-CM | POA: Diagnosis not present

## 2024-02-19 NOTE — Therapy (Signed)
 OUTPATIENT PHYSICAL THERAPY THORACOLUMBAR TREATMENT   Patient Name: Melinda Thompson MRN: [id number] DOB:September 22, 1950, 73 y.o., female Today's Date: 02/19/2024  END OF SESSION:  PT End of Session - 02/19/24 1403     Visit Number 3    Number of Visits 16    Date for PT Re-Evaluation 04/08/24    Authorization Type aetna medicare copay $10    Progress Note Due on Visit 10    PT Start Time 1403    PT Stop Time 1441    PT Time Calculation (min) 38 min    Activity Tolerance Patient tolerated treatment well            Past Medical History:  Diagnosis Date   Anxiety    Arthritis    osteoarthritis   GERD (gastroesophageal reflux disease)    Headache    migraines   History of hiatal hernia    Past Surgical History:  Procedure Laterality Date   JOINT REPLACEMENT     bil hip   TOTAL HIP ARTHROPLASTY     bil   TOTAL HIP REVISION Right 11/15/2019   Procedure: TOTAL HIP REVISION;  Surgeon: Melodi Lerner, MD;  Location: [facility];  Service: Orthopedics;  Laterality: Right;    vein surgey      inner thigh left leg has a ? 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   IRRITABLE BOWEL SYNDROME 03/16/2010   Diaphragmatic hernia 02/12/2010   DEPRESSION 01/04/2010   GERD 01/04/2010   Constipation 01/04/2010   CHEST PAIN 01/04/2010   PERSONAL HISTORY OF ARTHRITIS 01/04/2010    PCP: Dr Almarie Das   REFERRING PROVIDER: Dr Ozell JONETTA Finney  REFERRING DIAG: Lumbar spondylosis   Rationale for Evaluation and Treatment: Rehabilitation  THERAPY DIAG:  Lumbar spondylosis  Other symptoms and signs involving the musculoskeletal system  Muscle weakness (generalized)  ONSET DATE: 11/13/23  SUBJECTIVE:                                                                                                                                                                                            SUBJECTIVE STATEMENT: Feeling ok. It just does what it does. Pain is along the Rt low back and lateral thigh currently.   EVAL: Patient reports that she has had back pain for the past three months. She was going out the back steps at home, walking fast and shooed a cat from her car. She noticed that her R foot turned in and she felt pain in the R LB. She  had difficulty walking without her foot turning in. Symptoms have continued. She has seen the orthopedist and hip xrays are fine. She feels that her back is not in alignment. Did have a root canal two days ago which increased symptoms.   PERTINENT HISTORY:  S/P R THA with continued LBP; chronic LBP; arthritis; diaphragmatic hernia 2011; IBS 2011; degenerative spondylolisthesis; R THA 09/14/19; revisiton R THA 11/30/19; bilat knee pain; lumbar spondylosis; mild scoliosis; myofacial pain   PAIN:  Are you having pain? Yes: NPRS scale: 2/10 Pain location: Rt low back and Rt posterior thigh  Pain description: dull,deep ache  Aggravating factors: sitting  Relieving factors: TENS; meds; K tape   PRECAUTIONS: None  RED FLAGS: None   WEIGHT BEARING RESTRICTIONS: No  FALLS:  Has patient fallen in last 6 months? 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  DIAGNOSTIC FINDINGS:  No recent diagnostic tests available for hip of LB  PATIENT SURVEYS:  PSFS: THE PATIENT SPECIFIC FUNCTIONAL SCALE  Place score of 0-10 (0 = unable to perform activity and 10 = able to perform activity at the same  level as before injury or problem)  Activity Date: 02/12/24    Walking > 5-10 min 2    2. Standing from prolonged sitting  3    3. Sleeping  3    4.      Total Score 8      Total Score = Sum of activity scores/number of activities 8/3 = 2.67  Minimally Detectable Change: 3 points (for single activity); 2 points (for average score)  [facility] (nd). The Patient Specific Functional Scale . Retrieved from [email or web address]   COGNITION: Overall cognitive status: Within functional limits for tasks assessed     SENSATION: Numbness R lateral hip since THA   MUSCLE LENGTH: Hamstrings: Right 75 deg; Left 80 deg Tight hip flexors R >> L   POSTURE: rounded shoulders, forward head, increased thoracic kyphosis, and higher hemipelvis R   PALPATION: Tightness  R anterior hip through iliopsoas; posterior hip through the gluts and piriformis; lumbar paraspinals, QL, lats   LUMBAR ROM:   AROM eval  Flexion 95%  Extension 30% tight   Right lateral flexion 75% tight   Left lateral flexion 75% tight   Right rotation 20%  Left rotation 30%   (Blank rows = not tested)  LOWER EXTREMITY ROM:     Active  Right eval Left eval  Hip flexion 90 95  Hip extension 0 10  Hip abduction    Hip adduction    Hip internal rotation 16 50  Hip external rotation 40 34  Knee flexion 90 95  Knee extension    Ankle dorsiflexion    Ankle plantarflexion    Ankle inversion    Ankle eversion     (Blank rows = not tested)  LOWER EXTREMITY MMT:    MMT Right eval Left eval  Hip flexion 3+ 4  Hip extension 3 4-  Hip abduction 3- 4  Hip adduction    Hip internal rotation  Hip external rotation    Knee flexion 4+ 4+  Knee extension 4 4+  Ankle dorsiflexion    Ankle plantarflexion    Ankle inversion    Ankle eversion     (Blank rows = not tested)  LUMBAR SPECIAL TESTS:  Straight leg raise test: Negative, Slump test:  Negative   FUNCTIONAL TESTS:  5 times sit to stand:  SLS - unable to stand on R independently; 10 sec L   GAIT: Distance walked: 40 feet Assistive device utilized: None Level of assistance: Complete Independence Comments: antalgic gait with decreased WB R LE in stance phase; trunk asymmetry  [facility] Adult PT Treatment:                                                DATE: 02/19/24 Therapeutic Exercise: Seated trunk flexion x 10  DKTC x 10   Neuromuscular re-ed: Standing hip abduction 2 x 10  Standing hip extension 2 x 10  SLS multiple reps each side working on level pelvis/neutral trunk BUE support to single UE  Self Care: Anatomy of current condition  Posture education with recommendation to utilize towel roll for support during sitting.    [facility] Adult PT Treatment:                                                DATE: 02/17/24 Therapeutic Exercise: LTR x 1 minute  Straight leg piriformis stretch d/c due to poor form Figure 4 x 20 sec each  SKTC x 10 each   Neuromuscular re-ed: Hip bridge 2 x 10  Hooklying TA activation x 10 Supine posterior pelvic tilt 2 x 10  Hooklying resisted hip abduction green band 2 x 10  Hooklying resisted hip flexion with TA engagement; green band 2 x 10   Self Care: Gym recommendations in the community   TREATMENT DATE: 02/12/24                                                                                                                                 PATIENT EDUCATION:  Education details: HEP update  Person educated: Patient Education method: Explanation, Demonstration, Actor cues, Verbal cues, and Handouts Education comprehension: verbalized understanding, returned demonstration, verbal cues required, tactile cues required, and needs further education  HOME EXERCISE PROGRAM: Access Code: [email or web address] URL: [email or web address] Date: 02/17/2024 Prepared by: Lucie Meeter  Exercises - Supine Transversus Abdominis Bracing  with Pelvic Floor Contraction  - 2 x daily - 7 x weekly - 1 sets - 10 reps - 10sec  hold - Supine Lower Trunk Rotation  - 2 x daily - 7 x weekly - 1 sets - 3-5 reps - 10-20  sec  hold - Supine Bridge  - 2 x daily - 7 x weekly - 1-2 sets - 10 reps - 5-10 sec  hold - Supine Hip External Rotation Stretch  - 1 x daily - 7 x weekly - 3 sets - 20-30 sec  hold  ASSESSMENT:  CLINICAL IMPRESSION: Patient appears to respond well to flexion biased movement with resolution of thigh pain at conclusion of session. Progressed hip strengthening with good tolerance. With standing hip abduction she has difficulty isolating hip abduction on the RLE, compensating with hip hike initially. With SLS she requires consistent cues to reduce lateral trunk lean and hip drop. She reported improvement in pain at conclusion of session, but notes muscle fatigue.   EVAL: Patient is a 73 y.o. female who was seen today for physical therapy evaluation and treatment for lumbar spondylosis. She has a history of chronic LBP and R > L hip pain following R THA  and revisition 2021. She presents with recent onset of LBP in the past 3 months following an awkward step in [place]. She has poor posture and alignment with standing leg length difference R hemipelvis higher than L; limited trunk and LR mobility, especially R LE; weakness through core and R > L LEs; abnormal gait pattern; muscular tightness to palpation; radicular pain R > L LE; limited functional activity tolerance; pain with activities. Patient will benefit from PT to address problems identified.   OBJECTIVE IMPAIRMENTS: Abnormal gait, decreased activity tolerance, decreased balance, decreased mobility, difficulty walking, decreased ROM, decreased strength, hypomobility, increased fascial restrictions, improper body mechanics, postural dysfunction, and pain.   ACTIVITY LIMITATIONS: carrying, lifting, bending, sitting, standing, squatting, sleeping, stairs, and locomotion  level  PARTICIPATION LIMITATIONS: meal prep, cleaning, laundry, driving, shopping, community activity, occupation, and yard work  PERSONAL FACTORS: Behavior pattern, Fitness, Past/current experiences, and Time since onset of injury/illness/exacerbation are also affecting patient's functional outcome.   REHAB POTENTIAL: Good  CLINICAL DECISION MAKING: Evolving/moderate complexity  EVALUATION COMPLEXITY: Moderate   GOALS: Goals reviewed with patient? Yes  SHORT TERM GOALS: Target date: 03/11/2024   Independent in initial HEP  Baseline: Goal status: INITIAL  2.  Patient reports 50% improvement in back pain, allowing her to progress with core stabilization and strengthening and LE strengthening Baseline:  Goal status: INITIAL    LONG TERM GOALS: Target date: 04/08/2024   Increase LE strength to 4/5 to 5/5  Baseline:  Goal status: INITIAL  2.  Patient reports ability to stand and walk for 15-20 min with minimal to no increase in pain  Baseline:  Goal status: INITIAL  3.  Patient reports ability to stand from sitting without increased pain  Baseline:  Goal status: INITIAL  4.  Patient reports improved sleep quality - sleeping for 2-4 hours without awakening due to pain in hips or LB  Baseline:  Goal status: INITIAL  5.  PSFS: THE PATIENT SPECIFIC FUNCTIONAL SCALE  Place score of 0-10 (0 = unable to perform activity and 10 = able to perform activity at the same level as before injury or problem)  Activity Date: 02/12/24    Walking > 5-10 min 2    2. Standing from prolonged sitting  3    3. Sleeping  3    4.      Total Score 8      Total Score = Sum of activity scores/number of activities 8/3 = 2.67  Minimally Detectable Change: 3 points (for single activity); 2 points (for average score) Baseline:  Goal status: INITIAL  6.  Independent in advanced HEP including aquatic program as indicated  Baseline:  Goal status: INITIAL  PLAN:  PT FREQUENCY:  2x/week  PT DURATION: 8 weeks  PLANNED INTERVENTIONS: 97164- PT Re-evaluation, 97110-Therapeutic exercises, 97530- Therapeutic activity, [id number]- Neuromuscular re-education, 97535- Self Care, 02859- Manual therapy, [phone]- Gait training, [phone]- Aquatic Therapy, Patient/Family education, Balance training, Stair training, Taping, and Joint mobilization.  PLAN FOR NEXT SESSION: review and progress with exercises; continue with spine care and ergonomic education; manual work and modalities as indicated; hip strengthening,core stabilization   Sherriann Szuch, PT, DPT, ATC 02/19/24 2:44 PM

## 2024-02-23 ENCOUNTER — Ambulatory Visit

## 2024-02-23 DIAGNOSIS — M47816 Spondylosis without myelopathy or radiculopathy, lumbar region: Secondary | ICD-10-CM | POA: Diagnosis not present

## 2024-02-23 DIAGNOSIS — M6281 Muscle weakness (generalized): Secondary | ICD-10-CM

## 2024-02-23 DIAGNOSIS — R29898 Other symptoms and signs involving the musculoskeletal system: Secondary | ICD-10-CM

## 2024-02-23 NOTE — Therapy (Signed)
 OUTPATIENT PHYSICAL THERAPY THORACOLUMBAR TREATMENT   Patient Name: Melinda Thompson MRN: [id number] DOB:02-Nov-1950, 73 y.o., female Today's Date: 02/23/2024  END OF SESSION:  PT End of Session - 02/23/24 0932     Visit Number 4    Number of Visits 16    Date for PT Re-Evaluation 04/08/24    Authorization Type [employer]na medicare copay $10    Progress Note Due on Visit 10    PT Start Time 0932    PT Stop Time 1013    PT Time Calculation (min) 41 min    Activity Tolerance Patient tolerated treatment well            Past Medical History:  Diagnosis Date   Anxiety    Arthritis    osteoarthritis   GERD (gastroesophageal reflux disease)    Headache    migraines   History of hiatal hernia    Past Surgical History:  Procedure Laterality Date   JOINT REPLACEMENT     bil hip   TOTAL HIP ARTHROPLASTY     bil   TOTAL HIP REVISION Right 11/15/2019   Procedure: TOTAL HIP REVISION;  Surgeon: Melodi Lerner, MD;  Location: [facility];  Service: Orthopedics;  Laterality: Right;    vein surgey      inner thigh left leg has a ? 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   IRRITABLE BOWEL SYNDROME 03/16/2010   Diaphragmatic hernia 02/12/2010   DEPRESSION 01/04/2010   GERD 01/04/2010   Constipation 01/04/2010   CHEST PAIN 01/04/2010   PERSONAL HISTORY OF ARTHRITIS 01/04/2010    PCP: Dr Almarie Das   REFERRING PROVIDER: Dr Ozell JONETTA Finney  REFERRING DIAG: Lumbar spondylosis   Rationale for Evaluation and Treatment: Rehabilitation  THERAPY DIAG:  Lumbar spondylosis  Other symptoms and signs involving the musculoskeletal system  Muscle weakness (generalized)  ONSET DATE: 11/13/23  SUBJECTIVE:                                                                                                                                                                                            SUBJECTIVE STATEMENT: Patient reports she joined the gym last week after leaving here. Patient reports she is tight today in the Rt leg, but not painful.   EVAL: Patient reports that she has had back pain for the past three months. She was going out the back steps at home, walking fast and shooed a cat from her car. She noticed that her R foot turned in and she felt pain  in the R LB. She had difficulty walking without her foot turning in. Symptoms have continued. She has seen the orthopedist and hip xrays are fine. She feels that her back is not in alignment. Did have a root canal two days ago which increased symptoms.   PERTINENT HISTORY:  S/P R THA with continued LBP; chronic LBP; arthritis; diaphragmatic hernia 2011; IBS 2011; degenerative spondylolisthesis; R THA 09/14/19; revisiton R THA 11/30/19; bilat knee pain; lumbar spondylosis; mild scoliosis; myofacial pain   PAIN:  Are you having pain? Yes: NPRS scale: none currently;  at worst 4/10 Pain location: Rt low back and Rt posterior thigh  Pain description: dull,deep ache  Aggravating factors: sitting  Relieving factors: TENS; meds; K tape   PRECAUTIONS: None  RED FLAGS: None   WEIGHT BEARING RESTRICTIONS: No  FALLS:  Has patient fallen in last 6 months? 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  DIAGNOSTIC FINDINGS:  No recent diagnostic tests available for hip of LB  PATIENT SURVEYS:  PSFS: THE PATIENT SPECIFIC FUNCTIONAL SCALE  Place score of 0-10 (0 = unable to perform  activity and 10 = able to perform activity at the same level as before injury or problem)  Activity Date: 02/12/24    Walking > 5-10 min 2    2. Standing from prolonged sitting  3    3. Sleeping  3    4.      Total Score 8      Total Score = Sum of activity scores/number of activities 8/3 = 2.67  Minimally Detectable Change: 3 points (for single activity); 2 points (for average score)  [facility] (nd). The Patient Specific Functional Scale . Retrieved from [email or web address]   COGNITION: Overall cognitive status: Within functional limits for tasks assessed     SENSATION: Numbness R lateral hip since THA   MUSCLE LENGTH: Hamstrings: Right 75 deg; Left 80 deg Tight hip flexors R >> L   POSTURE: rounded shoulders, forward head, increased thoracic kyphosis, and higher hemipelvis R   PALPATION: Tightness  R anterior hip through iliopsoas; posterior hip through the gluts and piriformis; lumbar paraspinals, QL, lats   LUMBAR ROM:   AROM eval  Flexion 95%  Extension 30% tight   Right lateral flexion 75% tight   Left lateral flexion 75% tight   Right rotation 20%  Left rotation 30%   (Blank rows = not tested)  LOWER EXTREMITY ROM:     Active  Right eval Left eval  Hip flexion 90 95  Hip extension 0 10  Hip abduction    Hip adduction    Hip internal rotation 16 50  Hip external rotation 40 34  Knee flexion 90 95  Knee extension    Ankle dorsiflexion    Ankle plantarflexion    Ankle inversion    Ankle eversion     (Blank rows = not tested)  LOWER EXTREMITY MMT:    MMT Right eval Left eval  Hip flexion 3+ 4  Hip extension 3 4-  Hip abduction 3-  4  Hip adduction    Hip internal rotation    Hip external rotation    Knee flexion 4+ 4+  Knee extension 4 4+  Ankle dorsiflexion    Ankle plantarflexion    Ankle inversion    Ankle eversion     (Blank rows = not tested)  LUMBAR SPECIAL TESTS:   Straight leg raise test: Negative, Slump test: Negative   FUNCTIONAL TESTS:  5 times sit to stand:  SLS - unable to stand on R independently; 10 sec L   GAIT: Distance walked: 40 feet Assistive device utilized: None Level of assistance: Complete Independence Comments: antalgic gait with decreased WB R LE in stance phase; trunk asymmetry  [facility] Adult PT Treatment:                                                DATE: 02/23/24 Therapeutic Exercise: IT band stretch with strap 2  x 30 sec  Prone quad stretch 2 x 30 sec  HEP update   Neuromuscular re-ed: Sidelying hip abduction 2 x 10  Supine bent knee fallout 2 x 10  Supine TA march 2 x 10  SLR 2 x 10   Self Care: Gym equipment recommendations to complete and to avoid    [facility] Adult PT Treatment:                                                DATE: 02/19/24 Therapeutic Exercise: Seated trunk flexion x 10  DKTC x 10   Neuromuscular re-ed: Standing hip abduction 2 x 10  Standing hip extension 2 x 10  SLS multiple reps each side working on level pelvis/neutral trunk BUE support to single UE  Self Care: Anatomy of current condition  Posture education with recommendation to utilize towel roll for support during sitting.    [facility] Adult PT Treatment:                                                DATE: 02/17/24 Therapeutic Exercise: LTR x 1 minute  Straight leg piriformis stretch d/c due to poor form Figure 4 x 20 sec each  SKTC x 10 each   Neuromuscular re-ed: Hip bridge 2 x 10  Hooklying TA activation x 10 Supine posterior pelvic tilt 2 x 10  Hooklying resisted hip abduction green band 2 x 10  Hooklying resisted hip flexion with TA engagement; green band 2 x 10   Self Care: Gym recommendations in the community  PATIENT EDUCATION:  Education details: HEP update  Person educated: Patient Education method:  Explanation, Demonstration, Tactile cues, Verbal cues, and Handouts Education comprehension: verbalized understanding, returned demonstration, verbal cues required, tactile cues required, and needs further education  HOME EXERCISE PROGRAM: Access Code: [email or web address] URL: [email or web address] Date: 02/23/2024 Prepared by: Lucie Meeter  Exercises - Supine Transversus Abdominis Bracing with Pelvic Floor Contraction  - 2 x daily - 7 x weekly - 1 sets - 10 reps - 10sec  hold - Supine Lower Trunk Rotation  - 2 x daily - 7 x weekly - 1 sets - 3-5 reps - 10-20 sec  hold - Supine Bridge  - 2 x daily - 7 x weekly - 1-2 sets - 10 reps - 5-10 sec  hold - Supine Hip External Rotation Stretch  - 1 x daily - 7 x weekly - 3 sets - 20-30 sec  hold - Bent Knee Fallouts  - 1 x daily - 7 x weekly - 2 sets - 10 reps - Sidelying Hip Abduction  - 1 x daily - 7 x weekly - 2 sets - 10 reps - Supine Active Straight Leg Raise  - 1 x daily - 7 x weekly - 2 sets - 10 reps  ASSESSMENT:  CLINICAL IMPRESSION: Focused on progression of core stabilization and hip strengthening with good tolerance. Does have difficulty maintaining TA activation with dynamic core stabilization in hooklying position. Challenged with [place] strength progression of the Rt hip, but no reports of pain with strength progression.   EVAL: Patient is a 73 y.o. female who was seen today for physical therapy evaluation and treatment for lumbar spondylosis. She has a history of chronic LBP and R > L hip pain following R THA  and revisition 2021. She presents with recent onset of LBP in the past 3 months following an awkward step in carport. She has poor posture and alignment with standing leg length difference R hemipelvis higher than L; limited trunk and LR mobility, especially R LE; weakness through core and R > L LEs; abnormal gait pattern; muscular tightness to palpation; radicular pain R > L LE; limited functional activity tolerance; pain with  activities. Patient will benefit from PT to address problems identified.   OBJECTIVE IMPAIRMENTS: Abnormal gait, decreased activity tolerance, decreased balance, decreased mobility, difficulty walking, decreased ROM, decreased strength, hypomobility, increased fascial restrictions, improper body mechanics, postural dysfunction, and pain.   ACTIVITY LIMITATIONS: carrying, lifting, bending, sitting, standing, squatting, sleeping, stairs, and locomotion level  PARTICIPATION LIMITATIONS: meal prep, cleaning, laundry, driving, shopping, community activity, occupation, and yard work  PERSONAL FACTORS: Behavior pattern, Fitness, Past/current experiences, and Time since onset of injury/illness/exacerbation are also affecting patient's functional outcome.   REHAB POTENTIAL: Good  CLINICAL DECISION MAKING: Evolving/moderate complexity  EVALUATION COMPLEXITY: Moderate   GOALS: Goals reviewed with patient? Yes  SHORT TERM GOALS: Target date: 03/11/2024   Independent in initial HEP  Baseline: Goal status: INITIAL  2.  Patient reports 50% improvement in back pain, allowing her to progress with core stabilization and strengthening and LE strengthening Baseline:  Goal status: INITIAL    LONG TERM GOALS: Target date: 04/08/2024   Increase LE strength to 4/5 to 5/5  Baseline:  Goal status: INITIAL  2.  Patient reports ability to stand and walk for 15-20 min with minimal to no increase in pain  Baseline:  Goal status: INITIAL  3.  Patient reports ability to stand from sitting without increased pain  Baseline:  Goal status: INITIAL  4.  Patient reports improved sleep quality - sleeping for 2-4 hours without awakening due to pain in hips or LB  Baseline:  Goal status: INITIAL  5.  PSFS: THE PATIENT SPECIFIC FUNCTIONAL SCALE  Place score of 0-10 (0 = unable to perform activity and 10 = able to perform activity at the same level as before injury or problem)  Activity Date: 02/12/24     Walking > 5-10 min 2    2. Standing from prolonged sitting  3    3. Sleeping  3    4.      Total Score 8      Total Score = Sum of activity scores/number of activities 8/3 = 2.67  Minimally Detectable Change: 3 points (for single activity); 2 points (for average score) Baseline:  Goal status: INITIAL  6.  Independent in advanced HEP including aquatic program as indicated  Baseline:  Goal status: INITIAL  PLAN:  PT FREQUENCY: 2x/week  PT DURATION: 8 weeks  PLANNED INTERVENTIONS: 97164- PT Re-evaluation, 97110-Therapeutic exercises, 97530- Therapeutic activity, [id number]- Neuromuscular re-education, 97535- Self Care, [id number]- Manual therapy, [phone]- Gait training, [phone]- Aquatic Therapy, Patient/Family education, Balance training, Stair training, Taping, and Joint mobilization.  PLAN FOR NEXT SESSION: review and progress with exercises; continue with spine care and ergonomic education; manual work and modalities as indicated; hip strengthening,core stabilization   Tiari Andringa, PT, DPT, ATC 02/23/24 10:16 AM

## 2024-02-26 ENCOUNTER — Ambulatory Visit

## 2024-02-26 DIAGNOSIS — R29898 Other symptoms and signs involving the musculoskeletal system: Secondary | ICD-10-CM

## 2024-02-26 DIAGNOSIS — M6281 Muscle weakness (generalized): Secondary | ICD-10-CM | POA: Diagnosis not present

## 2024-02-26 DIAGNOSIS — M47816 Spondylosis without myelopathy or radiculopathy, lumbar region: Secondary | ICD-10-CM | POA: Diagnosis not present

## 2024-02-26 NOTE — Therapy (Signed)
 OUTPATIENT PHYSICAL THERAPY THORACOLUMBAR TREATMENT   Patient Name: Melinda Thompson MRN: [id number] DOB:10-Aug-1950, 73 y.o., female Today's Date: 02/26/2024  END OF SESSION:  PT End of Session - 02/26/24 1100     Visit Number 5    Number of Visits 16    Date for PT Re-Evaluation 04/08/24    Authorization Type [employer]etna medicare copay $10    Progress Note Due on Visit 10    PT Start Time 1100    PT Stop Time 1143    PT Time Calculation (min) 43 min    Activity Tolerance Patient tolerated treatment well             Past Medical History:  Diagnosis Date   Anxiety    Arthritis    osteoarthritis   GERD (gastroesophageal reflux disease)    Headache    migraines   History of hiatal hernia    Past Surgical History:  Procedure Laterality Date   JOINT REPLACEMENT     bil hip   TOTAL HIP ARTHROPLASTY     bil   TOTAL HIP REVISION Right 11/15/2019   Procedure: TOTAL HIP REVISION;  Surgeon: Melodi Lerner, MD;  Location: [facility];  Service: Orthopedics;  Laterality: Right;    vein surgey      inner thigh left leg has a ? 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   IRRITABLE BOWEL SYNDROME 03/16/2010   Diaphragmatic hernia 02/12/2010   DEPRESSION 01/04/2010   GERD 01/04/2010   Constipation 01/04/2010   CHEST PAIN 01/04/2010   PERSONAL HISTORY OF ARTHRITIS 01/04/2010    PCP: Dr Almarie Das   REFERRING PROVIDER: Dr Ozell JONETTA Finney  REFERRING DIAG: Lumbar spondylosis   Rationale for Evaluation and Treatment: Rehabilitation  THERAPY DIAG:  Lumbar spondylosis  Other symptoms and signs involving the musculoskeletal system  Muscle weakness (generalized)  ONSET DATE: 11/13/23  SUBJECTIVE:                                                                                                                                                                                            SUBJECTIVE STATEMENT: Patient reports she is not feeling that bad this morning. Has not completed much of the exercises because she has been busy doing yard work. Has noticed some edema in the Rt ankle, but this happens on/off since her surgery.   EVAL: Patient reports that she has had back pain for the past three months. She was going out the back steps at home, walking fast and shooed  a cat from her car. She noticed that her R foot turned in and she felt pain in the R LB. She had difficulty walking without her foot turning in. Symptoms have continued. She has seen the orthopedist and hip xrays are fine. She feels that her back is not in alignment. Did have a root canal two days ago which increased symptoms.   PERTINENT HISTORY:  S/P R THA with continued LBP; chronic LBP; arthritis; diaphragmatic hernia 2011; IBS 2011; degenerative spondylolisthesis; R THA 09/14/19; revisiton R THA 11/30/19; bilat knee pain; lumbar spondylosis; mild scoliosis; myofacial pain   PAIN:  Are you having pain? Yes: NPRS scale: none currently;  at worst 3/10 Pain location: Rt low back and Rt posterior thigh  Pain description: dull,deep ache  Aggravating factors: sitting  Relieving factors: TENS; meds; K tape   PRECAUTIONS: None  RED FLAGS: None   WEIGHT BEARING RESTRICTIONS: No  FALLS:  Has patient fallen in last 6 months? 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  DIAGNOSTIC FINDINGS:  No recent diagnostic tests available for hip of LB  PATIENT SURVEYS:   PSFS: THE PATIENT SPECIFIC FUNCTIONAL SCALE  Place score of 0-10 (0 = unable to perform activity and 10 = able to perform activity at the same level as before injury or problem)  Activity Date: 02/12/24    Walking > 5-10 min 2    2. Standing from prolonged sitting  3    3. Sleeping  3    4.      Total Score 8      Total Score = Sum of activity scores/number of activities 8/3 = 2.67  Minimally Detectable Change: 3 points (for single activity); 2 points (for average score)  [facility] (nd). The Patient Specific Functional Scale . Retrieved from [email or web address]   COGNITION: Overall cognitive status: Within functional limits for tasks assessed     SENSATION: Numbness R lateral hip since THA   MUSCLE LENGTH: Hamstrings: Right 75 deg; Left 80 deg Tight hip flexors R >> L   POSTURE: rounded shoulders, forward head, increased thoracic kyphosis, and higher hemipelvis R   PALPATION: Tightness  R anterior hip through iliopsoas; posterior hip through the gluts and piriformis; lumbar paraspinals, QL, lats   LUMBAR ROM:   AROM eval  Flexion 95%  Extension 30% tight   Right lateral flexion 75% tight   Left lateral flexion 75% tight   Right rotation 20%  Left rotation 30%   (Blank rows = not tested)  LOWER EXTREMITY ROM:     Active  Right eval Left eval  Hip flexion 90 95  Hip extension 0 10  Hip abduction    Hip adduction    Hip internal rotation 16 50  Hip external rotation 40 34  Knee flexion 90 95  Knee extension    Ankle dorsiflexion    Ankle plantarflexion    Ankle inversion    Ankle eversion     (Blank rows = not tested)  LOWER EXTREMITY MMT:    MMT Right  eval Left eval  Hip flexion 3+ 4  Hip extension 3 4-  Hip abduction 3- 4  Hip adduction    Hip internal rotation    Hip external rotation    Knee flexion 4+ 4+  Knee extension 4 4+  Ankle dorsiflexion    Ankle plantarflexion     Ankle inversion    Ankle eversion     (Blank rows = not tested)  LUMBAR SPECIAL TESTS:  Straight leg raise test: Negative, Slump test: Negative  02/26/24: Leg length: ASIS to malleoli: 89 cm RLE, 90 LLE (symmetrical pelvic alignment in supine)    FUNCTIONAL TESTS:  5 times sit to stand:  SLS - unable to stand on R independently; 10 sec L   GAIT: Distance walked: 40 feet Assistive device utilized: None Level of assistance: Complete Independence Comments: antalgic gait with decreased WB R LE in stance phase; trunk asymmetry  [facility] Adult PT Treatment:                                                DATE: 02/26/24 Therapeutic Exercise: Nustep level 6 x 5 minutes UE/LE  Neuromuscular re-ed: Standing hip abduction 2 x 10  Standing hip extension 2 x 10  SLS multiple trials bilaterally; single UE support  Standing resisted march red band; single UE support 2 x 10  Therapeutic Activity: Leg press 2 x 10 with blue band at thighs; 65 lbs  Walking in clinic working on correcting trunk lean and hip hike with and without heel lift on the RLE     [facility] Adult PT Treatment:                                                DATE: 02/23/24 Therapeutic Exercise: IT band stretch with strap 2  x 30 sec  Prone quad stretch 2 x 30 sec  HEP update   Neuromuscular re-ed: Sidelying hip abduction 2 x 10  Supine bent knee fallout 2 x 10  Supine TA march 2 x 10  SLR 2 x 10   Self Care: Gym equipment recommendations to complete and to avoid    [facility] Adult PT Treatment:                                                DATE: 02/19/24 Therapeutic Exercise: Seated trunk flexion x 10  DKTC x 10   Neuromuscular re-ed: Standing hip abduction 2 x 10  Standing hip extension 2 x 10  SLS multiple reps each side working on level pelvis/neutral trunk BUE support to single UE  Self Care: Anatomy of current condition  Posture education with recommendation to utilize towel roll for support during sitting.     [facility] Adult PT Treatment:                                                DATE: 02/17/24 Therapeutic Exercise: LTR x 1 minute  Straight leg piriformis stretch d/c  due to poor form Figure 4 x 20 sec each  SKTC x 10 each   Neuromuscular re-ed: Hip bridge 2 x 10  Hooklying TA activation x 10 Supine posterior pelvic tilt 2 x 10  Hooklying resisted hip abduction green band 2 x 10  Hooklying resisted hip flexion with TA engagement; green band 2 x 10   Self Care: Gym recommendations in the community                                                                                                              PATIENT EDUCATION:  Education details: HEP review  Person educated: Patient Education method: Explanation Education comprehension: verbalized understanding  HOME EXERCISE PROGRAM: Access Code: [email or web address] URL: [email or web address] Date: 02/23/2024 Prepared by: Lucie Meeter  Exercises - Supine Transversus Abdominis Bracing with Pelvic Floor Contraction  - 2 x daily - 7 x weekly - 1 sets - 10 reps - 10sec  hold - Supine Lower Trunk Rotation  - 2 x daily - 7 x weekly - 1 sets - 3-5 reps - 10-20 sec  hold - Supine Bridge  - 2 x daily - 7 x weekly - 1-2 sets - 10 reps - 5-10 sec  hold - Supine Hip External Rotation Stretch  - 1 x daily - 7 x weekly - 3 sets - 20-30 sec  hold - Bent Knee Fallouts  - 1 x daily - 7 x weekly - 2 sets - 10 reps - Sidelying Hip Abduction  - 1 x daily - 7 x weekly - 2 sets - 10 reps - Supine Active Straight Leg Raise  - 1 x daily - 7 x weekly - 2 sets - 10 reps  ASSESSMENT:  CLINICAL IMPRESSION: Focused on gluteal strengthening in standing to address antalgic gait. She is challenged with maintaining lumbopelvic stability during SL strengthening on the RLE. When walking in clinic she is initially noted to have lateral trunk lean and hip hike during stance on the RLE. Leg length was assessed with 1 cm difference noted. We tried heel lift in  clinic, though this created no change in her gait mechanics.   EVAL: Patient is a 73 y.o. female who was seen today for physical therapy evaluation and treatment for lumbar spondylosis. She has a history of chronic LBP and R > L hip pain following R THA  and revisition 2021. She presents with recent onset of LBP in the past 3 months following an awkward step in carport. She has poor posture and alignment with standing leg length difference R hemipelvis higher than L; limited trunk and LR mobility, especially R LE; weakness through core and R > L LEs; abnormal gait pattern; muscular tightness to palpation; radicular pain R > L LE; limited functional activity tolerance; pain with activities. Patient will benefit from PT to address problems identified.   OBJECTIVE IMPAIRMENTS: Abnormal gait, decreased activity tolerance, decreased balance, decreased mobility, difficulty walking, decreased ROM, decreased strength, hypomobility, increased fascial restrictions, improper body mechanics, postural dysfunction, and pain.   ACTIVITY LIMITATIONS:  carrying, lifting, bending, sitting, standing, squatting, sleeping, stairs, and locomotion level  PARTICIPATION LIMITATIONS: meal prep, cleaning, laundry, driving, shopping, community activity, occupation, and yard work  PERSONAL FACTORS: Behavior pattern, Fitness, Past/current experiences, and Time since onset of injury/illness/exacerbation are also affecting patient's functional outcome.   REHAB POTENTIAL: Good  CLINICAL DECISION MAKING: Evolving/moderate complexity  EVALUATION COMPLEXITY: Moderate   GOALS: Goals reviewed with patient? Yes  SHORT TERM GOALS: Target date: 03/11/2024   Independent in initial HEP  Baseline: Goal status: INITIAL  2.  Patient reports 50% improvement in back pain, allowing her to progress with core stabilization and strengthening and LE strengthening Baseline:  Goal status: INITIAL    LONG TERM GOALS: Target date:  04/08/2024   Increase LE strength to 4/5 to 5/5  Baseline:  Goal status: INITIAL  2.  Patient reports ability to stand and walk for 15-20 min with minimal to no increase in pain  Baseline:  Goal status: INITIAL  3.  Patient reports ability to stand from sitting without increased pain  Baseline:  Goal status: INITIAL  4.  Patient reports improved sleep quality - sleeping for 2-4 hours without awakening due to pain in hips or LB  Baseline:  Goal status: INITIAL  5.  PSFS: THE PATIENT SPECIFIC FUNCTIONAL SCALE  Place score of 0-10 (0 = unable to perform activity and 10 = able to perform activity at the same level as before injury or problem)  Activity Date: 02/12/24    Walking > 5-10 min 2    2. Standing from prolonged sitting  3    3. Sleeping  3    4.      Total Score 8      Total Score = Sum of activity scores/number of activities 8/3 = 2.67  Minimally Detectable Change: 3 points (for single activity); 2 points (for average score) Baseline:  Goal status: INITIAL  6.  Independent in advanced HEP including aquatic program as indicated  Baseline:  Goal status: INITIAL  PLAN:  PT FREQUENCY: 2x/week  PT DURATION: 8 weeks  PLANNED INTERVENTIONS: 97164- PT Re-evaluation, 97110-Therapeutic exercises, 97530- Therapeutic activity, [id number]- Neuromuscular re-education, 97535- Self Care, [id number]- Manual therapy, [phone]- Gait training, [phone]- Aquatic Therapy, Patient/Family education, Balance training, Stair training, Taping, and Joint mobilization.  PLAN FOR NEXT SESSION: review and progress HEP prn; hip strengthening,core stabilization; gait training working on correcting trunk lean and hip hike (likely attributed to weakness?).   Naeema Patlan, PT, DPT, ATC 02/26/24 11:46 AM

## 2024-03-02 ENCOUNTER — Ambulatory Visit: Attending: Anesthesiology | Admitting: Rehabilitative and Restorative Service Providers"

## 2024-03-02 ENCOUNTER — Encounter: Payer: Self-pay | Admitting: Rehabilitative and Restorative Service Providers"

## 2024-03-02 DIAGNOSIS — M47816 Spondylosis without myelopathy or radiculopathy, lumbar region: Secondary | ICD-10-CM | POA: Insufficient documentation

## 2024-03-02 DIAGNOSIS — M6281 Muscle weakness (generalized): Secondary | ICD-10-CM | POA: Insufficient documentation

## 2024-03-02 DIAGNOSIS — R29898 Other symptoms and signs involving the musculoskeletal system: Secondary | ICD-10-CM | POA: Diagnosis not present

## 2024-03-02 NOTE — Therapy (Signed)
 OUTPATIENT PHYSICAL THERAPY THORACOLUMBAR TREATMENT   Patient Name: Melinda Thompson MRN: [id number] DOB:1951/03/09, 73 y.o., female Today's Date: 03/02/2024  END OF SESSION:  PT End of Session - 03/02/24 1100     Visit Number 6    Number of Visits 32    Date for PT Re-Evaluation 04/27/24    Authorization Type aetna medicare copay $10    Progress Note Due on Visit 16   re-eval/re-cert at visit 6   PT Start Time 1100    PT Stop Time 1145    PT Time Calculation (min) 45 min    Activity Tolerance Patient tolerated treatment well             Past Medical History:  Diagnosis Date   Anxiety    Arthritis    osteoarthritis   GERD (gastroesophageal reflux disease)    Headache    migraines   History of hiatal hernia    Past Surgical History:  Procedure Laterality Date   JOINT REPLACEMENT     bil hip   TOTAL HIP ARTHROPLASTY     bil   TOTAL HIP REVISION Right 11/15/2019   Procedure: TOTAL HIP REVISION;  Surgeon: Melodi Lerner, MD;  Location: [facility];  Service: Orthopedics;  Laterality: Right;    vein surgey      inner thigh left leg has a ? 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   IRRITABLE BOWEL SYNDROME 03/16/2010   Diaphragmatic hernia 02/12/2010   DEPRESSION 01/04/2010   GERD 01/04/2010   Constipation 01/04/2010   CHEST PAIN 01/04/2010   PERSONAL HISTORY OF ARTHRITIS 01/04/2010    PCP: Dr Almarie Das   REFERRING PROVIDER: Dr Ozell JONETTA Finney  REFERRING DIAG: Lumbar spondylosis   Rationale for Evaluation and Treatment: Rehabilitation  THERAPY DIAG:  Lumbar spondylosis  Other symptoms and signs involving the musculoskeletal system  Muscle weakness (generalized)  ONSET DATE: 11/13/23  SUBJECTIVE:                                                                                                                                                                                            SUBJECTIVE STATEMENT: Patient reports she has been doing well with less pain. She has been walking in her yard and walking up and down her stairs with no trouble. She got in her car this morning and felt a pain in the R hip and leg. She works three days a week and is on her feet 7-8 hours when working. Has been to the gym  once but planning to go today. Determined to make time for the gym.  EVAL: Patient reports that she has had back pain for the past three months. She was going out the back steps at home, walking fast and shooed a cat from her car. She noticed that her R foot turned in and she felt pain in the R LB. She had difficulty walking without her foot turning in. Symptoms have continued. She has seen the orthopedist and hip xrays are fine. She feels that her back is not in alignment. Did have a root canal two days ago which increased symptoms.   PERTINENT HISTORY:  S/P R THA with continued LBP; chronic LBP; arthritis; diaphragmatic hernia 2011; IBS 2011; degenerative spondylolisthesis; R THA 09/14/19; revisiton R THA 11/30/19; bilat knee pain; lumbar spondylosis; mild scoliosis; myofacial pain   PAIN:  Are you having pain? Yes: NPRS scale: none currently;  at worst 4/10this morning  Pain location: Rt low back and Rt posterior thigh  Pain description: dull,deep ache  Aggravating factors: sitting  Relieving factors: TENS; meds; K tape   PRECAUTIONS: None   WEIGHT BEARING RESTRICTIONS: No  FALLS:  Has patient fallen in last 6 months? 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  DIAGNOSTIC FINDINGS:  No recent diagnostic tests available for hip of LB  PATIENT SURVEYS:  PSFS: THE PATIENT SPECIFIC FUNCTIONAL SCALE  Place score of 0-10 (0 = unable to perform activity and 10 = able to perform activity at the same level as before injury or problem)  Activity Date: 02/12/24    Walking > 5-10 min 2    2. Standing from prolonged sitting  3    3. Sleeping  3    4.      Total Score 8      Total Score = Sum of activity scores/number of activities 8/3 = 2.67  Minimally Detectable Change: 3 points (for single activity); 2 points (for average score)  [facility] (nd). The Patient Specific Functional Scale . Retrieved from [email or web address]    SENSATION: Numbness R lateral hip since THA   MUSCLE LENGTH: Hamstrings: Right 75 deg; Left 80 deg Tight hip flexors R >> L   POSTURE: rounded shoulders, forward head, increased thoracic kyphosis, and higher hemipelvis R   PALPATION: Tightness  R anterior hip through iliopsoas; posterior hip through the gluts and piriformis; lumbar paraspinals, QL, lats   LUMBAR ROM:   AROM eval 03/02/24  Flexion 95% 100%  Extension 30% tight  30% tight   Right lateral flexion 75% tight  85% tight   Left lateral flexion 75% tight  75% tight   Right rotation 20% 30% tight   Left rotation 30% 30% tight    (Blank rows = not tested)  LOWER EXTREMITY ROM:     Active  Right eval Left eval  Hip flexion 90 95  Hip extension 0 10  Hip abduction    Hip adduction    Hip internal rotation 16 50  Hip external rotation 40 34  Knee flexion 90 95  Knee extension    Ankle dorsiflexion    Ankle plantarflexion    Ankle inversion    Ankle eversion     (Blank rows = not tested)  LOWER EXTREMITY MMT:    MMT Right eval 03/02/24 Left eval 03/02/24  Hip flexion 3+ 4 4 5   Hip extension 3 4- 4- 4+  Hip abduction 3- 3+ 4 4+  Hip  adduction      Hip internal rotation      Hip external rotation      Knee flexion 4+ 5- 4+ 5  Knee extension 4 5 4+ 5  Ankle dorsiflexion      Ankle plantarflexion      Ankle inversion      Ankle eversion       (Blank rows = not tested)  LUMBAR SPECIAL TESTS:  Straight leg raise test: Negative, Slump test: Negative  02/26/24: Leg length: ASIS to malleoli: 89 cm RLE, 90 LLE (symmetrical pelvic alignment in supine)    FUNCTIONAL TESTS:  5 times sit to stand:  SLS - unable to stand on R independently; 10 sec L   GAIT: Distance walked: 40 feet Assistive device utilized: None Level of assistance: Complete Independence Comments: antalgic gait with decreased WB R LE in stance phase; trunk asymmetry     [facility] Adult PT Treatment:                                                DATE: 03/02/24 Therapeutic Exercise: Reassessment lumbar ROM and LE strength  Prone  Glut set 5 sec x 10  Hip extension 3 sec x 10 R/L   Manual:   TPR R iliacus   STM R psoas  Neuromuscular re-ed: Standing hip abduction leading with heel x 10  Standing hip extension x 10   Therapeutic Activity: Sidelying  Hip abduction hips rolled forward R LE slight extension, leading with heel 3 sec x 10 with PT assist for form  Supine  Bridge with green TB above knees 3 sec x 10  Alternate hip abduction green TB 3 sec x 10 R/L  Myofacial ball release for R hip flexors prone and R posterior hip in standing using 4 inch ball    [facility] Adult PT Treatment:                                                DATE: 02/26/24 Therapeutic Exercise: Nustep level 6 x 5 minutes UE/LE  Neuromuscular re-ed: Standing hip abduction 2 x 10  Standing hip extension 2 x 10  SLS multiple trials bilaterally; single UE support  Standing resisted march red band; single UE support 2 x 10  Therapeutic Activity: Leg press 2 x 10 with blue band at thighs; 65 lbs  Walking in clinic working on correcting trunk lean and hip hike with and without  heel lift on the RLE     [facility] Adult PT Treatment:                                                DATE: 02/23/24 Therapeutic Exercise: IT  band stretch with strap 2  x 30 sec  Prone quad stretch 2 x 30 sec  HEP update   Neuromuscular re-ed: Sidelying hip abduction 2 x 10  Supine bent knee fallout 2 x 10  Supine TA march 2 x 10  SLR 2 x 10   Self Care: Gym equipment recommendations to complete and to avoid                                                                                                         PATIENT EDUCATION:  Education details: HEP review  Person educated: Patient Education method: Explanation Education comprehension: verbalized understanding  HOME EXERCISE PROGRAM: Access Code: [email or web address] URL: [email or web address] Date: 03/02/2024 Prepared by: Genifer Lazenby  Exercises - Supine Transversus Abdominis Bracing with Pelvic Floor Contraction  - 2 x daily - 7 x weekly - 1 sets - 10 reps - 10sec  hold - Supine Lower Trunk Rotation  - 2 x daily - 7 x weekly - 1 sets - 3-5 reps - 10-20 sec  hold - Supine Bridge  - 2 x daily - 7 x weekly - 1-2 sets - 10 reps - 5-10 sec  hold - Supine Hip External Rotation Stretch  - 1 x daily - 7 x weekly - 3 sets - 20-30 sec  hold - Bent Knee Fallouts  - 1 x daily - 7 x weekly - 2 sets - 10 reps - Sidelying Hip Abduction  - 1 x daily - 7 x weekly - 2 sets - 10 reps - Supine Active Straight Leg Raise  - 1 x daily - 7 x weekly - 2 sets - 10 reps - Hip Flexor Stretch at Edge of Bed  - 2 x daily - 7 x weekly - 1 sets - 3 reps - 30 sec  hold - Standing Piriformis Release with Ball at Wall  - 2 x daily - 7 x weekly - 30-60 sec  hold  ASSESSMENT:  CLINICAL IMPRESSION: Re-eval demonstrated good gains in hip strength. Patient continues to have weakness in R hip. Focused on isolating R hip extension and abduction targeting the glut med. Functional activity level is increasing with decreased pain. Note palpable tightness in the  iliacus and psoas musculature R compared to L. Added psoas stretch in supine and myofacial ball release through the psoas patient in prone and standing. Progressing well toward goals. Will benefit from continued physical therapy skilled treatment.  EVAL: Patient is a 73 y.o. female who was seen today for physical therapy evaluation and treatment for lumbar spondylosis. She has a history of chronic LBP and R > L hip pain following R THA  and revisition 2021. She presents with recent onset of LBP in the past 3 months following an awkward step in [place]. She has poor posture and alignment with standing leg length difference R hemipelvis higher than L; limited trunk and LR mobility, especially R LE; weakness through core and R > L LEs; abnormal gait pattern; muscular tightness to palpation; radicular pain R > L LE; limited functional activity tolerance;  pain with activities. Patient will benefit from PT to address problems identified.    GOALS: Goals reviewed with patient? Yes  SHORT TERM GOALS: Target date: 03/11/2024   Independent in initial HEP  Baseline: Goal status: INITIAL  2.  Patient reports 50% improvement in back pain, allowing her to progress with core stabilization and strengthening and LE strengthening Baseline:  Goal status: INITIAL    LONG TERM GOALS: Target date: 04/08/2024   Increase LE strength to 4/5 to 5/5  Baseline:  Goal status: INITIAL  2.  Patient reports ability to stand and walk for 15-20 min with minimal to no increase in pain  Baseline:  Goal status: INITIAL  3.  Patient reports ability to stand from sitting without increased pain  Baseline:  Goal status: INITIAL  4.  Patient reports improved sleep quality - sleeping for 2-4 hours without awakening due to pain in hips or LB  Baseline:  Goal status: INITIAL  5.  PSFS: THE PATIENT SPECIFIC FUNCTIONAL SCALE  Place score of 0-10 (0 = unable to perform activity and 10 = able to perform activity at the same  level as before injury or problem)  Activity Date: 02/12/24    Walking > 5-10 min 2    2. Standing from prolonged sitting  3    3. Sleeping  3    4.      Total Score 8      Total Score = Sum of activity scores/number of activities 8/3 = 2.67  Minimally Detectable Change: 3 points (for single activity); 2 points (for average score) Baseline:  Goal status: INITIAL  6.  Independent in advanced HEP including aquatic program as indicated  Baseline:  Goal status: INITIAL  PLAN:  PT FREQUENCY: 2x/week  PT DURATION: 8 weeks  PLANNED INTERVENTIONS: 97164- PT Re-evaluation, 97110-Therapeutic exercises, 97530- Therapeutic activity, [id number]- Neuromuscular re-education, 97535- Self Care, [id number]- Manual therapy, [phone]- Gait training, [phone]- Aquatic Therapy, Patient/Family education, Balance training, Stair training, Taping, and Joint mobilization.  PLAN FOR NEXT SESSION: review and progress HEP prn; hip strengthening,core stabilization; gait training working on correcting trunk lean and hip hike (likely attributed to weakness?).   Honestee Revard P. Ina PT, MPH 03/02/24 11:05 AM

## 2024-03-04 ENCOUNTER — Ambulatory Visit

## 2024-03-09 ENCOUNTER — Ambulatory Visit: Admitting: Rehabilitative and Restorative Service Providers"

## 2024-03-09 ENCOUNTER — Encounter: Payer: Self-pay | Admitting: Rehabilitative and Restorative Service Providers"

## 2024-03-09 DIAGNOSIS — R29898 Other symptoms and signs involving the musculoskeletal system: Secondary | ICD-10-CM

## 2024-03-09 DIAGNOSIS — M47816 Spondylosis without myelopathy or radiculopathy, lumbar region: Secondary | ICD-10-CM | POA: Diagnosis not present

## 2024-03-09 DIAGNOSIS — M6281 Muscle weakness (generalized): Secondary | ICD-10-CM | POA: Diagnosis not present

## 2024-03-09 NOTE — Therapy (Signed)
 OUTPATIENT PHYSICAL THERAPY THORACOLUMBAR TREATMENT   Patient Name: Melinda Thompson MRN: [id number] DOB:1950-10-15, 73 y.o., female Today's Date: 03/09/2024  END OF SESSION:  PT End of Session - 03/09/24 1104     Visit Number 7    Number of Visits 16    Date for PT Re-Evaluation 04/08/24    Authorization Type aetna medicare copay $10    Progress Note Due on Visit 16    PT Start Time 1100    PT Stop Time 1145    PT Time Calculation (min) 45 min    Activity Tolerance Patient tolerated treatment well             Past Medical History:  Diagnosis Date   Anxiety    Arthritis    osteoarthritis   GERD (gastroesophageal reflux disease)    Headache    migraines   History of hiatal hernia    Past Surgical History:  Procedure Laterality Date   JOINT REPLACEMENT     bil hip   TOTAL HIP ARTHROPLASTY     bil   TOTAL HIP REVISION Right 11/15/2019   Procedure: TOTAL HIP REVISION;  Surgeon: Melodi Lerner, MD;  Location: [facility];  Service: Orthopedics;  Laterality: Right;    vein surgey      inner thigh left leg has a ? 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   IRRITABLE BOWEL SYNDROME 03/16/2010   Diaphragmatic hernia 02/12/2010   DEPRESSION 01/04/2010   GERD 01/04/2010   Constipation 01/04/2010   CHEST PAIN 01/04/2010   PERSONAL HISTORY OF ARTHRITIS 01/04/2010    PCP: Dr Almarie Das   REFERRING PROVIDER: Dr Ozell JONETTA Finney  REFERRING DIAG: Lumbar spondylosis   Rationale for Evaluation and Treatment: Rehabilitation  THERAPY DIAG:  Lumbar spondylosis  Other symptoms and signs involving the musculoskeletal system  Muscle weakness (generalized)  ONSET DATE: 11/13/23  SUBJECTIVE:                                                                                                                                                                                            SUBJECTIVE STATEMENT: Patient reports she has been doing well with less pain. Notes improvement in balance with functional activities. She has been working in her yard and walking up and down her stairs with no trouble. She has some pain in the upper back which she feels is related to sitting in the dental office for a procedure yesterday. She works three days a week and is on her feet 7-8  hours when working. Was in the gym this week.   EVAL: Patient reports that she has had back pain for the past three months. She was going out the back steps at home, walking fast and shooed a cat from her car. She noticed that her R foot turned in and she felt pain in the R LB. She had difficulty walking without her foot turning in. Symptoms have continued. She has seen the orthopedist and hip xrays are fine. She feels that her back is not in alignment. Did have a root canal two days ago which increased symptoms.   PERTINENT HISTORY:  S/P R THA with continued LBP; chronic LBP; arthritis; diaphragmatic hernia 2011; IBS 2011; degenerative spondylolisthesis; R THA 09/14/19; revisiton R THA 11/30/19; bilat knee pain; lumbar spondylosis; mild scoliosis; myofacial pain   PAIN:  Are you having pain? Yes: NPRS scale: none currently;  at worst 1/10this morning  Pain location: Rt low back and Rt posterior thigh  Pain description: dull,deep ache  Aggravating factors: sitting  Relieving factors: TENS; meds; K tape   PRECAUTIONS: None   WEIGHT BEARING RESTRICTIONS: No  FALLS:  Has patient fallen in last 6 months? 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  DIAGNOSTIC FINDINGS:  No recent diagnostic tests available for hip of LB  PATIENT SURVEYS:  PSFS: THE PATIENT SPECIFIC FUNCTIONAL SCALE  Place score of 0-10 (0 = unable to perform activity and 10 = able to perform activity at the same level as before injury or problem)  Activity Date: 02/12/24    Walking > 5-10 min 2    2. Standing from prolonged sitting  3    3. Sleeping  3    4.      Total Score 8      Total Score = Sum of activity scores/number of activities 8/3 = 2.67  Minimally Detectable Change: 3 points (for single activity); 2 points (for average score)  [facility] (nd). The Patient Specific Functional Scale . Retrieved from [email or web address]    SENSATION: Numbness R lateral hip since THA   MUSCLE LENGTH: Hamstrings: Right 75 deg; Left 80 deg Tight hip flexors R >> L   POSTURE: rounded shoulders, forward head, increased thoracic kyphosis, and higher hemipelvis R   PALPATION: Tightness  R anterior hip through iliopsoas; posterior hip through the gluts and piriformis; lumbar paraspinals, QL, lats   LUMBAR ROM:   AROM eval 03/02/24  Flexion 95% 100%  Extension 30% tight  30% tight   Right lateral flexion 75% tight  85% tight   Left lateral flexion 75% tight  75% tight   Right rotation 20% 30% tight   Left rotation 30% 30% tight    (Blank rows = not tested)  LOWER EXTREMITY ROM:     Active  Right eval Left eval  Hip flexion 90 95  Hip extension 0 10  Hip abduction    Hip adduction    Hip internal rotation 16 50  Hip external rotation 40 34  Knee flexion 90 95  Knee extension  Ankle dorsiflexion    Ankle plantarflexion    Ankle inversion    Ankle eversion     (Blank rows = not tested)  LOWER EXTREMITY MMT:    MMT Right eval 03/02/24 Left eval 03/02/24  Hip flexion 3+ 4 4 5   Hip extension 3 4- 4- 4+  Hip  abduction 3- 3+ 4 4+  Hip adduction      Hip internal rotation      Hip external rotation      Knee flexion 4+ 5- 4+ 5  Knee extension 4 5 4+ 5  Ankle dorsiflexion      Ankle plantarflexion      Ankle inversion      Ankle eversion       (Blank rows = not tested)  LUMBAR SPECIAL TESTS:  Straight leg raise test: Negative, Slump test: Negative  02/26/24: Leg length: ASIS to malleoli: 89 cm RLE, 90 LLE (symmetrical pelvic alignment in supine)    FUNCTIONAL TESTS:  5 times sit to stand:  SLS - unable to stand on R independently; 10 sec L   GAIT: Distance walked: 40 feet Assistive device utilized: None Level of assistance: Complete Independence Comments: antalgic gait with decreased WB R LE in stance phase; trunk asymmetry     [facility] Adult PT Treatment:                                                DATE: 03/09/24 Therapeutic Exercise: Sitting   Coregeous ball at T-spine  Thoracic extension with coregeous ball with hands overhead   Backward shoulder rolls  Prone  Glut set 5 sec x 10  Hip extension 3 sec x 10 R/L  Manual:   TPR R iliacus   STM R psoas  Neuromuscular re-ed: Standing hip abduction leading with heel x 10  Standing hip extension x 10  Realignment of pelvis in supine and standing   Therapeutic Activity: Myofacial ball release for hip flexors prone and posterior hip in standing using 4 inch ball  Sidelying  Hip abduction hips rolled forward R LE slight extension, leading with heel 3 sec x 10 with PT assist for form  Supine  Psoas stretch thomas position pulling both knees to chest then dropping R LE off table 30 sec x 2 for R  Bridge with green TB above knees 3 sec x 10  Alternate hip abduction green TB 3 sec x 10 R/L    [facility] Adult PT Treatment:                                                DATE: 03/02/24 Therapeutic Exercise: Reassessment lumbar ROM and LE strength  Prone  Glut set 5 sec x 10  Hip extension 3 sec x 10 R/L   Manual:   TPR R iliacus    STM R psoas  Neuromuscular re-ed: Standing hip abduction leading with heel x 10  Standing hip extension x 10   Therapeutic Activity: Sidelying  Hip abduction hips rolled forward R LE slight extension, leading with heel 3 sec x 10 with PT assist for form  Supine  Bridge with green TB above knees 3 sec x 10  Alternate hip abduction green TB 3 sec  x 10 R/L  Myofacial ball release for R hip flexors prone and R posterior hip in standing using 4 inch ball    [facility] Adult PT Treatment:                                                DATE: 02/26/24 Therapeutic Exercise: Nustep level 6 x 5 minutes UE/LE  Neuromuscular re-ed: Standing hip abduction 2 x 10  Standing hip extension 2 x 10  SLS multiple trials bilaterally; single UE support  Standing resisted march red band; single UE support 2 x 10  Therapeutic Activity: Leg press 2 x 10 with blue band at thighs; 65 lbs  Walking in clinic working on correcting trunk lean and hip hike with and without heel lift on the RLE     [facility] Adult PT Treatment:                                                DATE: 02/23/24 Therapeutic Exercise: IT band stretch with strap 2  x 30 sec  Prone quad stretch 2 x 30 sec  HEP update   Neuromuscular re-ed: Sidelying hip abduction 2 x 10  Supine bent knee fallout 2 x 10  Supine TA march 2 x 10  SLR 2 x 10   Self Care: Gym equipment recommendations to complete and to avoid                                                                                                         PATIENT EDUCATION:  Education details: HEP review  Person educated: Patient Education method: Explanation Education comprehension: verbalized understanding  HOME EXERCISE PROGRAM: Access Code: [email or web address] URL: [email or web address] Date: 03/09/2024 Prepared by: Jaiveon Suppes  Exercises - Supine Transversus Abdominis Bracing with Pelvic Floor Contraction  - 2 x daily - 7 x weekly - 1 sets - 10 reps - 10sec  hold -  Supine Lower Trunk Rotation  - 2 x daily - 7 x weekly - 1 sets - 3-5 reps - 10-20 sec  hold - Supine Bridge  - 2 x daily - 7 x weekly - 1-2 sets - 10 reps - 5-10 sec  hold - Supine Hip External Rotation Stretch  - 1 x daily - 7 x weekly - 3 sets - 20-30 sec  hold - Bent Knee Fallouts  - 1 x daily - 7 x weekly - 2 sets - 10 reps - Sidelying Hip Abduction  - 1 x daily - 7 x weekly - 2 sets - 10 reps - Supine Active Straight Leg Raise  - 1 x daily - 7 x weekly - 2 sets - 10 reps - Hip Flexor Stretch at Edge of Bed  - 2 x daily - 7 x weekly - 1 sets - 3  reps - 30 sec  hold - Standing Piriformis Release with Ball at [employer]  - 2 x daily - 7 x weekly - 30-60 sec  hold - Clamshell with Resistance  - 2 x daily - 7 x weekly - 2 sets - 10 reps - 3 sec  hold - Hooklying Isometric Clamshell  - 2 x daily - 7 x weekly - 1 sets - 10 reps - 3 sec  hold - Supine Bridge with Resistance Band  - 2 x daily - 7 x weekly - 1-2 sets - 10 reps - 5-10 sec  hold  ASSESSMENT:  CLINICAL IMPRESSION: Patient reports good progress with decreased pain and increased functional activities. Treatment consisted of release of iliacus and psoas musculature bilat and strengthening with focus on R hip extension and abduction targeting the glut med. Note continued palpable tightness in the iliacus and psoas musculature R compared to L. Continued with psoas stretch in supine and myofacial ball release through the psoas patient in prone and supine. Progressing well toward goals. Will benefit from continued physical therapy skilled treatment.  EVAL: Patient is a 73 y.o. female who was seen today for physical therapy evaluation and treatment for lumbar spondylosis. She has a history of chronic LBP and R > L hip pain following R THA  and revisition 2021. She presents with recent onset of LBP in the past 3 months following an awkward step in [place]. She has poor posture and alignment with standing leg length difference R hemipelvis higher than L;  limited trunk and LR mobility, especially R LE; weakness through core and R > L LEs; abnormal gait pattern; muscular tightness to palpation; radicular pain R > L LE; limited functional activity tolerance; pain with activities. Patient will benefit from PT to address problems identified.    GOALS: Goals reviewed with patient? Yes  SHORT TERM GOALS: Target date: 03/11/2024   Independent in initial HEP  Baseline: Goal status: INITIAL  2.  Patient reports 50% improvement in back pain, allowing her to progress with core stabilization and strengthening and LE strengthening Baseline:  Goal status: INITIAL    LONG TERM GOALS: Target date: 04/08/2024   Increase LE strength to 4/5 to 5/5  Baseline:  Goal status: INITIAL  2.  Patient reports ability to stand and walk for 15-20 min with minimal to no increase in pain  Baseline:  Goal status: INITIAL  3.  Patient reports ability to stand from sitting without increased pain  Baseline:  Goal status: INITIAL  4.  Patient reports improved sleep quality - sleeping for 2-4 hours without awakening due to pain in hips or LB  Baseline:  Goal status: INITIAL  5.  PSFS: THE PATIENT SPECIFIC FUNCTIONAL SCALE  Place score of 0-10 (0 = unable to perform activity and 10 = able to perform activity at the same level as before injury or problem)  Activity Date: 02/12/24    Walking > 5-10 min 2    2. Standing from prolonged sitting  3    3. Sleeping  3    4.      Total Score 8      Total Score = Sum of activity scores/number of activities 8/3 = 2.67  Minimally Detectable Change: 3 points (for single activity); 2 points (for average score) Baseline:  Goal status: INITIAL  6.  Independent in advanced HEP including aquatic program as indicated  Baseline:  Goal status: INITIAL  PLAN:  PT FREQUENCY: 2x/week  PT DURATION: 8 weeks  PLANNED INTERVENTIONS: 97164- PT Re-evaluation, 97110-Therapeutic exercises, 97530- Therapeutic activity, [id number]-  Neuromuscular re-education, [phone]- Self Care, 02859- Manual therapy, [phone]- Gait training, [phone]- Aquatic Therapy, Patient/Family education, Balance training, Stair training, Taping, and Joint mobilization.  PLAN FOR NEXT SESSION: review and progress HEP prn; hip strengthening,core stabilization; gait training working on correcting trunk lean and hip hike (likely attributed to weakness?).   Vane Yapp P. Ina PT, MPH 03/09/24 11:05 AM

## 2024-03-11 ENCOUNTER — Ambulatory Visit

## 2024-03-15 ENCOUNTER — Encounter

## 2024-03-16 DIAGNOSIS — D1801 Hemangioma of skin and subcutaneous tissue: Secondary | ICD-10-CM | POA: Diagnosis not present

## 2024-03-16 DIAGNOSIS — L821 Other seborrheic keratosis: Secondary | ICD-10-CM | POA: Diagnosis not present

## 2024-03-16 DIAGNOSIS — D235 Other benign neoplasm of skin of trunk: Secondary | ICD-10-CM | POA: Diagnosis not present

## 2024-03-16 DIAGNOSIS — L814 Other melanin hyperpigmentation: Secondary | ICD-10-CM | POA: Diagnosis not present

## 2024-03-16 DIAGNOSIS — D229 Melanocytic nevi, unspecified: Secondary | ICD-10-CM | POA: Diagnosis not present

## 2024-04-01 ENCOUNTER — Encounter: Payer: Self-pay | Admitting: Emergency Medicine

## 2024-04-01 ENCOUNTER — Ambulatory Visit
Admission: EM | Admit: 2024-04-01 | Discharge: 2024-04-01 | Disposition: A | Discharge status: Home / Self Care | Attending: Family Medicine | Admitting: Family Medicine

## 2024-04-01 DIAGNOSIS — A6920 Lyme disease, unspecified: Secondary | ICD-10-CM | POA: Diagnosis not present

## 2024-04-01 MED ORDER — CEFUROXIME AXETIL 500 MG PO TABS: 500.0000 mg | ORAL_TABLET | Freq: Two times a day (BID) | ORAL | 0 refills | Status: AC

## 2024-04-01 NOTE — ED Provider Notes (Signed)
 [facility]    CSN: [id number] Arrival date & time: 04/01/24  0813      History   Chief Complaint Chief Complaint  Patient presents with   Tick Bite    HPI Melinda Thompson is a 73 y.o. female.   Patient found a tick bite on her left hip.  It is itchy and red.  This morning she noticed a red ring around the bite.  She recognizes this as possible Lyme disease.  He is here for evaluation and treatment Patient was treated for a tick bite last year and was given 10 days of doxycycline .  She states she was unable to tolerate the doxycycline  and had to stop it early    Past Medical History:  Diagnosis Date   Anxiety    Arthritis    osteoarthritis   GERD (gastroesophageal reflux disease)    Headache    migraines   History of hiatal hernia     Patient Active Problem List   Diagnosis Date Noted   Failed total hip arthroplasty 11/15/2019   IRRITABLE BOWEL SYNDROME 03/16/2010   Diaphragmatic hernia 02/12/2010   DEPRESSION 01/04/2010   GERD 01/04/2010   Constipation 01/04/2010   CHEST PAIN 01/04/2010   PERSONAL HISTORY OF ARTHRITIS 01/04/2010    Past Surgical History:  Procedure Laterality Date   JOINT REPLACEMENT     bil hip   TOTAL HIP ARTHROPLASTY     bil   TOTAL HIP REVISION Right 11/15/2019   Procedure: TOTAL HIP REVISION;  Surgeon: Melodi Lerner, MD;  Location: [facility];  Service: Orthopedics;  Laterality: Right;    vein surgey      inner thigh left leg has a ? stent done in office 1980's    OB History   No obstetric history on file.      Home Medications    Prior to Admission medications   Medication Sig Start Date End Date Taking? Authorizing Provider  cefUROXime (CEFTIN) 500 MG tablet Take 1 tablet (500 mg total) by mouth 2 (two) times daily with a meal. 04/01/24  Yes Maranda Jamee Jacob, MD  multivitamin-iron-minerals-folic acid (CENTRUM) chewable tablet Chew 1 tablet by mouth every morning.   Yes [provider]   acetaminophen  (TYLENOL ) 500 MG tablet Take 1,000 mg by mouth every 6 (six) hours as needed for moderate pain.    [provider]  cyclobenzaprine  (FLEXERIL ) 5 MG tablet Take 1 tablet (5 mg total) by mouth 2 (two) times daily as needed for muscle spasms. 09/03/22   Rising, Asberry, PA-C  predniSONE  (DELTASONE ) 20 MG tablet Take 3 tabs PO daily x 5 days. 10/27/23   Teddy Sharper, FNP  sertraline  (ZOLOFT ) 25 MG tablet Take 12.5 mg by mouth at bedtime.     [provider]    Family History Family History  Problem Relation Age of Onset   COPD Mother    Osteoporosis Mother    Emphysema Mother    Hypertension Father    COPD Father     Social History Social History   Tobacco Use   Smoking status: Never   Smokeless tobacco: Never  Vaping Use   Vaping status: Never Used  Substance Use Topics   Alcohol use: Not Currently    Comment: rarely   Drug use: No     Allergies   Doxycycline , Morphine , Shellfish allergy, and Sulfonamide derivatives   Review of Systems Review of Systems  See HPI Physical Exam Triage Vital Signs ED Triage Vitals  Encounter Vitals Group     BP 04/01/24 0829 134/86     Girls Systolic BP Percentile --      Girls Diastolic BP Percentile --      Boys Systolic BP Percentile --      Boys Diastolic BP Percentile --      Pulse Rate 04/01/24 0829 71     Resp 04/01/24 0829 18     Temp 04/01/24 0829 98.4 F (36.9 C)     Temp Source 04/01/24 0829 Oral     SpO2 04/01/24 0829 95 %     Weight 04/01/24 0827 174 lb (78.9 kg)     Height 04/01/24 0827 5' 4.5 (1.638 m)     Head Circumference --      Peak Flow --      Pain Score 04/01/24 0827 0     Pain Loc --      Pain Education --      Exclude from Growth Chart --    No data found.  Updated Vital Signs BP 134/86 (BP Location: Right Arm)   Pulse 71   Temp 98.4 F (36.9 C) (Oral)   Resp 18   Ht 5' 4.5 (1.638 m)   Wt 78.9 kg   SpO2 95%   BMI 29.41 kg/m      Physical  Exam Constitutional:      General: She is not in acute distress.    Appearance: She is well-developed.  HENT:     Head: Normocephalic and atraumatic.  Eyes:     Conjunctiva/sclera: Conjunctivae normal.     Pupils: Pupils are equal, round, and reactive to light.  Cardiovascular:     Rate and Rhythm: Normal rate.  Pulmonary:     Effort: Pulmonary effort is normal. No respiratory distress.  Musculoskeletal:        General: Normal range of motion.     Cervical back: Normal range of motion.  Skin:    General: Skin is warm and dry.     Findings: Rash present.  Neurological:     Mental Status: She is alert.      UC Treatments / Results  Labs (all labs ordered are listed, but only abnormal results are displayed) Labs Reviewed - No data to display  EKG   Radiology No results found.  Procedures Procedures (including critical care time)  Medications Ordered in UC Medications - No data to display  Initial Impression / Assessment and Plan / UC Course  I have reviewed the triage vital signs and the nursing notes.  Pertinent labs & imaging results that were available during my care of the patient were reviewed by me and considered in my medical decision making (see chart for details).     Typical Bullseye hip is seen on the left hip overlying the arthroplasty scar.  Patient is otherwise asymptomatic.  Importance of 14 days of antibiotics is emphasized to patient. Final Clinical Impressions(s) / UC Diagnoses   Final diagnoses:  Erythema migrans (Lyme disease)     Discharge Instructions      Take the antibiotic 2 x a day for 14 days If you are unable to tolerate the antibiotic, please call for an alternative   ED Prescriptions     Medication Sig Dispense Auth. Provider   cefUROXime (CEFTIN) 500 MG tablet Take 1 tablet (500 mg total) by mouth 2 (two) times daily with a meal. 28 tablet Maranda Jamee Jacob, MD      PDMP not reviewed this encounter.  Maranda Jamee Jacob, MD 04/01/24 [phone]

## 2024-04-01 NOTE — Discharge Instructions (Signed)
 Take the antibiotic 2 x a day for 14 days If you are unable to tolerate the antibiotic, please call for an alternative

## 2024-04-01 NOTE — ED Triage Notes (Signed)
 Patient c/o tick bite on left hip x 4 days, noticed it last night.  Patient c/o itching and redness.  Patient denies any OTC meds.

## 2024-04-02 ENCOUNTER — Telehealth: Payer: Self-pay

## 2024-04-02 NOTE — Telephone Encounter (Signed)
 Called patient to check in on her per protocol.   Pt reports she feels high when she eats since starting the medication. Pt also endorses fatigue. Instructed patient to eat with antibiotic and drink plenty of fluids and to check back in with us  if symptoms are not improving over the next few days. Recommended a probiotic or yoghourt use as well as monitoring herself for allergic reactions.

## 2024-04-19 DIAGNOSIS — Z79899 Other long term (current) drug therapy: Secondary | ICD-10-CM | POA: Diagnosis not present

## 2024-04-19 DIAGNOSIS — F411 Generalized anxiety disorder: Secondary | ICD-10-CM | POA: Diagnosis not present

## 2024-04-20 DIAGNOSIS — Z09 Encounter for follow-up examination after completed treatment for conditions other than malignant neoplasm: Secondary | ICD-10-CM | POA: Diagnosis not present

## 2024-04-20 DIAGNOSIS — B351 Tinea unguium: Secondary | ICD-10-CM | POA: Diagnosis not present

## 2024-04-20 DIAGNOSIS — L600 Ingrowing nail: Secondary | ICD-10-CM | POA: Diagnosis not present

## 2024-05-01 ENCOUNTER — Other Ambulatory Visit: Payer: Self-pay

## 2024-05-01 ENCOUNTER — Ambulatory Visit
Admission: EM | Admit: 2024-05-01 | Discharge: 2024-05-01 | Disposition: A | Discharge status: Home / Self Care | Attending: Family Medicine | Admitting: Family Medicine

## 2024-05-01 DIAGNOSIS — I480 Paroxysmal atrial fibrillation: Secondary | ICD-10-CM | POA: Diagnosis not present

## 2024-05-01 DIAGNOSIS — R9431 Abnormal electrocardiogram [ECG] [EKG]: Secondary | ICD-10-CM

## 2024-05-01 DIAGNOSIS — R0602 Shortness of breath: Secondary | ICD-10-CM | POA: Diagnosis not present

## 2024-05-01 DIAGNOSIS — R42 Dizziness and giddiness: Secondary | ICD-10-CM

## 2024-05-01 DIAGNOSIS — I4891 Unspecified atrial fibrillation: Secondary | ICD-10-CM | POA: Diagnosis not present

## 2024-05-01 DIAGNOSIS — R911 Solitary pulmonary nodule: Secondary | ICD-10-CM | POA: Diagnosis not present

## 2024-05-01 DIAGNOSIS — I4892 Unspecified atrial flutter: Secondary | ICD-10-CM | POA: Diagnosis not present

## 2024-05-01 DIAGNOSIS — R55 Syncope and collapse: Secondary | ICD-10-CM | POA: Diagnosis not present

## 2024-05-01 LAB — POCT FASTING CBG KUC MANUAL ENTRY: POCT Glucose (KUC): 100 mg/dL — AB (ref 70–99)

## 2024-05-01 NOTE — ED Notes (Signed)
 Patient is being discharged from the Urgent Care and sent to the Emergency Department via POV. Per Ozell Major FNP, patient is in need of higher level of care due to abnormal EKG and dizziness. Patient is aware and verbalizes understanding of plan of care.  Vitals:   05/01/24 1331  BP: (!) 150/86  Pulse: 76  Resp: 15  Temp: 98.3 F (36.8 C)  SpO2: 98%

## 2024-05-01 NOTE — ED Triage Notes (Signed)
 Pt presents to uc with co dizzy spells. Pt reports she has not been eating well and thinks she may be having low sugars. Pt reports she has these spells and feels like she is going to black out.

## 2024-05-01 NOTE — Discharge Instructions (Addendum)
 Advised patient EKG reveals normal sinus rhythm with possible left atrial enlargement possible inferior infarct, abnormal EKG. Advised patient go to [facility] ED now for further evaluation of dizziness.

## 2024-05-01 NOTE — ED Provider Notes (Signed)
 Melinda Thompson    CSN: [id number] Arrival date & time: 05/01/24  1304      History   Chief Complaint Chief Complaint  Patient presents with   Dizziness    HPI Melinda Thompson is a 73 y.o. female.   HPI 73 year old female presents with dizziness.  PMH significant for anxiety, paroxysmal atrial fibrillation, chronic venous hypertension and osteoarthritis.  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 stent done in office 1980's    OB History   No obstetric history on file.      Home Medications    Prior to Admission medications   Medication Sig Start Date End Date Taking? Authorizing Provider  acetaminophen  (TYLENOL ) 500 MG tablet Take 1,000 mg by mouth every 6 (six) hours as needed for moderate pain.    [provider]  cefUROXime (CEFTIN) 500 MG tablet Take 1 tablet (500 mg total) by mouth 2 (two) times daily with a meal. 04/01/24   Maranda Jamee Jacob, MD  cyclobenzaprine  (FLEXERIL ) 5 MG tablet Take 1 tablet (5 mg total) by mouth 2 (two) times daily as needed for muscle spasms. 09/03/22   Rising, Asberry, PA-C  multivitamin-iron-minerals-folic acid (CENTRUM) chewable tablet Chew 1  tablet by mouth every morning.    [provider]  predniSONE  (DELTASONE ) 20 MG tablet Take 3 tabs PO daily x 5 days. 10/27/23   Teddy Sharper, FNP  sertraline  (ZOLOFT ) 25 MG tablet Take 12.5 mg by mouth at bedtime.     [provider]    Family History Family History  Problem Relation Age of Onset   COPD Mother    Osteoporosis Mother    Emphysema Mother    Hypertension Father    COPD Father     Social History Social History   Tobacco Use   Smoking status: Never   Smokeless tobacco: Never  Vaping Use   Vaping status: Never Used  Substance Use Topics   Alcohol use: Not Currently    Comment: rarely   Drug use: No     Allergies   Doxycycline , Morphine , Shellfish allergy, and Sulfonamide derivatives   Review of Systems Review of Systems  Neurological:  Positive for dizziness.  All other systems reviewed and are negative.    Physical Exam Triage Vital Signs ED Triage Vitals  Encounter Vitals Group     BP      Girls Systolic BP Percentile      Girls Diastolic BP Percentile      Boys Systolic BP Percentile      Boys Diastolic BP  Percentile      Pulse      Resp      Temp      Temp src      SpO2      Weight      Height      Head Circumference      Peak Flow      Pain Score      Pain Loc      Pain Education      Exclude from Growth Chart    No data found.  Updated Vital Signs BP (!) 150/86   Pulse 76   Temp 98.3 F (36.8 C)   Resp 15   SpO2 98%    Physical Exam Vitals and nursing note reviewed.  Constitutional:      Appearance: Normal appearance. She is normal weight.  HENT:     Head: Normocephalic and atraumatic.     Right Ear: Tympanic membrane, ear canal and external ear normal.     Left Ear: Tympanic membrane, ear canal and external ear normal.     Mouth/Throat:     Mouth: Mucous membranes are moist.     Pharynx: Oropharynx is clear.  Eyes:     Extraocular Movements: Extraocular movements intact.      Conjunctiva/sclera: Conjunctivae normal.     Pupils: Pupils are equal, round, and reactive to light.  Cardiovascular:     Comments: Irregularly irregular Pulmonary:     Effort: Pulmonary effort is normal.     Breath sounds: Normal breath sounds. No wheezing, rhonchi or rales.  Musculoskeletal:        General: Normal range of motion.  Skin:    General: Skin is warm and dry.  Neurological:     General: No focal deficit present.     Mental Status: She is alert and oriented to person, place, and time. Mental status is at baseline.  Psychiatric:        Mood and Affect: Mood normal.        Behavior: Behavior normal.      UC Treatments / Results  Labs (all labs ordered are listed, but only abnormal results are displayed) Labs Reviewed  POCT FASTING CBG [facility] MANUAL ENTRY - Abnormal; Notable for the following components:      Result Value   POCT Glucose ([facility]) 100 (*)    All other components within normal limits    EKG   Radiology No results found.  Procedures Procedures (including critical Thompson time)  Medications Ordered in UC Medications - No data to display  Initial Impression / Assessment and Plan / UC Course  I have reviewed the triage vital signs and the nursing notes.  Pertinent labs & imaging results that were available during my Thompson of the patient were reviewed by me and considered in my medical decision making (see chart for details).     MDM: 1.  Dizziness-cardiac exam revealed irregularly irregular, patient with history of paroxysmal atrial fibrillation; 2.  Abnormal EKG-EKG reveals normal sinus rhythm with possible atrial enlargement, possible inferior infarct, abnormal EKG. Advised patient EKG reveals normal sinus rhythm with possible left atrial enlargement possible inferior infarct, abnormal EKG. Advised patient go to [facility] ED now for further evaluation of dizziness.  Patient agreed and verbalized understanding of these  instructions and this plan of Thompson today.  Patient discharged home, hemodynamically stable. Final Clinical Impressions(s) / UC Diagnoses   Final diagnoses:  Dizziness  Abnormal EKG  Discharge Instructions      Advised patient EKG reveals normal sinus rhythm with possible left atrial enlargement possible inferior infarct, abnormal EKG. Advised patient go to [facility] ED now for further evaluation of dizziness.     ED Prescriptions   None    PDMP not reviewed this encounter.   Teddy Sharper, FNP 05/01/24 1413

## 2024-05-02 DIAGNOSIS — R55 Syncope and collapse: Secondary | ICD-10-CM | POA: Diagnosis not present

## 2024-05-02 DIAGNOSIS — I4892 Unspecified atrial flutter: Secondary | ICD-10-CM | POA: Diagnosis not present

## 2024-05-02 DIAGNOSIS — I4891 Unspecified atrial fibrillation: Secondary | ICD-10-CM | POA: Diagnosis not present

## 2024-05-02 DIAGNOSIS — F411 Generalized anxiety disorder: Secondary | ICD-10-CM | POA: Diagnosis not present

## 2024-05-03 DIAGNOSIS — R55 Syncope and collapse: Secondary | ICD-10-CM | POA: Diagnosis not present

## 2024-05-03 DIAGNOSIS — I4891 Unspecified atrial fibrillation: Secondary | ICD-10-CM | POA: Diagnosis not present

## 2024-05-03 DIAGNOSIS — F411 Generalized anxiety disorder: Secondary | ICD-10-CM | POA: Diagnosis not present

## 2024-05-03 DIAGNOSIS — I480 Paroxysmal atrial fibrillation: Secondary | ICD-10-CM | POA: Diagnosis not present

## 2024-05-03 DIAGNOSIS — I4892 Unspecified atrial flutter: Secondary | ICD-10-CM | POA: Diagnosis not present

## 2024-05-07 DIAGNOSIS — I480 Paroxysmal atrial fibrillation: Secondary | ICD-10-CM | POA: Diagnosis not present

## 2024-05-11 DIAGNOSIS — Z1231 Encounter for screening mammogram for malignant neoplasm of breast: Secondary | ICD-10-CM | POA: Diagnosis not present

## 2024-05-11 DIAGNOSIS — R21 Rash and other nonspecific skin eruption: Secondary | ICD-10-CM | POA: Diagnosis not present

## 2024-05-11 DIAGNOSIS — Z23 Encounter for immunization: Secondary | ICD-10-CM | POA: Diagnosis not present

## 2024-05-11 DIAGNOSIS — I480 Paroxysmal atrial fibrillation: Secondary | ICD-10-CM | POA: Diagnosis not present

## 2024-05-11 DIAGNOSIS — I4891 Unspecified atrial fibrillation: Secondary | ICD-10-CM | POA: Diagnosis not present

## 2024-05-11 DIAGNOSIS — I4892 Unspecified atrial flutter: Secondary | ICD-10-CM | POA: Diagnosis not present

## 2024-05-11 DIAGNOSIS — Z0000 Encounter for general adult medical examination without abnormal findings: Secondary | ICD-10-CM | POA: Diagnosis not present

## 2024-05-15 DIAGNOSIS — R079 Chest pain, unspecified: Secondary | ICD-10-CM | POA: Diagnosis not present

## 2024-05-15 DIAGNOSIS — R002 Palpitations: Secondary | ICD-10-CM | POA: Diagnosis not present

## 2024-05-16 DIAGNOSIS — I480 Paroxysmal atrial fibrillation: Secondary | ICD-10-CM | POA: Diagnosis not present

## 2024-05-18 DIAGNOSIS — I480 Paroxysmal atrial fibrillation: Secondary | ICD-10-CM | POA: Diagnosis not present

## 2024-05-20 NOTE — Therapy (Signed)
 OUTPATIENT PHYSICAL THERAPY THORACOLUMBAR TREATMENT PHYSICAL THERAPY DISCHARGE SUMMARY  Visits from Start of Care: 6  Current functional level related to goals / functional outcomes: See progress note for discharge status    Remaining deficits: Unknown    Education / Equipment: HEP    Patient agrees to discharge. Patient goals were partially met. Patient is being discharged due to not returning since the last visit.    Patient Name: Melinda Thompson MRN: [id number] DOB:[birth year], 73 y.o., female Today's Date: 05/20/2024  END OF SESSION:       Past Medical History:  Diagnosis Date   Anxiety    Arthritis    osteoarthritis   GERD (gastroesophageal reflux disease)    Headache    migraines   History of hiatal hernia    Past Surgical History:  Procedure Laterality Date   JOINT REPLACEMENT     bil hip   TOTAL HIP ARTHROPLASTY     bil   TOTAL HIP REVISION Right 11/15/2019   Procedure: TOTAL HIP REVISION;  Surgeon: Melodi Lerner, MD;  Location: [facility];  Service: Orthopedics;  Laterality: Right;    vein surgey      inner thigh left leg has a ? 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   IRRITABLE BOWEL SYNDROME 03/16/2010   Diaphragmatic hernia 02/12/2010   DEPRESSION 01/04/2010   GERD 01/04/2010   Constipation 01/04/2010   CHEST PAIN 01/04/2010   PERSONAL HISTORY OF ARTHRITIS 01/04/2010    PCP: Dr Almarie Das   REFERRING PROVIDER: Dr Ozell JONETTA Finney  REFERRING DIAG: Lumbar spondylosis   Rationale for Evaluation and Treatment: Rehabilitation  THERAPY DIAG:  Lumbar spondylosis  Other symptoms and signs involving the musculoskeletal system  Muscle weakness (generalized)  ONSET DATE: 11/13/23  SUBJECTIVE:                                                                                                                                                                                            SUBJECTIVE STATEMENT: Patient reports she has been doing well with less pain. She has been walking in her yard and walking up and down her stairs with no trouble. She got in her car this morning and felt a pain in the R hip and leg. She works three days a week and is on her feet 7-8 hours when working. Has been to the gym once but planning to go today. Determined to make time for the gym.  EVAL: Patient reports that she has had back pain for the past three  months. She was going out the back steps at home, walking fast and shooed a cat from her car. She noticed that her R foot turned in and she felt pain in the R LB. She had difficulty walking without her foot turning in. Symptoms have continued. She has seen the orthopedist and hip xrays are fine. She feels that her back is not in alignment. Did have a root canal two days ago which increased symptoms.   PERTINENT HISTORY:  S/P R THA with continued LBP; chronic LBP; arthritis; diaphragmatic hernia 2011; IBS 2011; degenerative spondylolisthesis; R THA 09/14/19; revisiton R THA 11/30/19; bilat knee pain; lumbar spondylosis; mild scoliosis; myofacial pain   PAIN:  Are you having pain? Yes: NPRS scale: none currently;  at worst 4/10this morning  Pain location: Rt low back and Rt posterior thigh  Pain description: dull,deep ache  Aggravating factors: sitting  Relieving factors: TENS; meds; K tape   PRECAUTIONS: None   WEIGHT BEARING RESTRICTIONS: No  FALLS:  Has patient fallen in last 6 months? 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  DIAGNOSTIC FINDINGS:  No recent diagnostic tests available for hip of LB  PATIENT SURVEYS:  PSFS: THE PATIENT SPECIFIC FUNCTIONAL SCALE  Place score of 0-10 (0 = unable to perform activity and 10 = able to perform activity at the same level as before injury or problem)  Activity Date: 02/12/24    Walking > 5-10 min 2    2. Standing from prolonged sitting  3    3. Sleeping  3    4.      Total Score 8      Total Score = Sum of activity scores/number of activities 8/3 = 2.67  Minimally Detectable Change: 3 points (for single activity); 2 points (for average score)  [facility] (nd). The Patient Specific Functional Scale . Retrieved from [email or web address]    SENSATION: Numbness R lateral hip since THA   MUSCLE LENGTH: Hamstrings: Right 75 deg; Left 80 deg Tight hip flexors R >> L   POSTURE: rounded shoulders, forward head, increased thoracic kyphosis, and higher hemipelvis R   PALPATION: Tightness  R anterior hip through iliopsoas; posterior hip through the gluts and piriformis; lumbar paraspinals, QL, lats   LUMBAR ROM:   AROM eval 03/02/24  Flexion 95% 100%  Extension 30% tight  30% tight   Right lateral flexion 75% tight  85% tight   Left lateral flexion 75% tight  75% tight   Right rotation 20% 30% tight   Left rotation 30% 30% tight    (Blank rows = not tested)  LOWER EXTREMITY ROM:     Active  Right eval Left eval  Hip flexion 90 95  Hip extension 0 10  Hip abduction    Hip adduction    Hip internal rotation 16 50  Hip external rotation 40 34  Knee flexion 90 95  Knee extension    Ankle dorsiflexion    Ankle plantarflexion    Ankle inversion    Ankle eversion     (Blank  rows = not tested)  LOWER EXTREMITY MMT:    MMT Right eval 03/02/24 Left eval 03/02/24  Hip flexion 3+ 4 4 5   Hip extension 3 4- 4- 4+  Hip abduction 3- 3+ 4 4+   Hip adduction      Hip internal rotation      Hip external rotation      Knee flexion 4+ 5- 4+ 5  Knee extension 4 5 4+ 5  Ankle dorsiflexion      Ankle plantarflexion      Ankle inversion      Ankle eversion       (Blank rows = not tested)  LUMBAR SPECIAL TESTS:  Straight leg raise test: Negative, Slump test: Negative  02/26/24: Leg length: ASIS to malleoli: 89 cm RLE, 90 LLE (symmetrical pelvic alignment in supine)    FUNCTIONAL TESTS:  5 times sit to stand:  SLS - unable to stand on R independently; 10 sec L   GAIT: Distance walked: 40 feet Assistive device utilized: None Level of assistance: Complete Independence Comments: antalgic gait with decreased WB R LE in stance phase; trunk asymmetry     [facility] Adult PT Treatment:                                                DATE: 03/02/24 Therapeutic Exercise: Reassessment lumbar ROM and LE strength  Prone  Glut set 5 sec x 10  Hip extension 3 sec x 10 R/L   Manual:   TPR R iliacus   STM R psoas  Neuromuscular re-ed: Standing hip abduction leading with heel x 10  Standing hip extension x 10   Therapeutic Activity: Sidelying  Hip abduction hips rolled forward R LE slight extension, leading with heel 3 sec x 10 with PT assist for form  Supine  Bridge with green TB above knees 3 sec x 10  Alternate hip abduction green TB 3 sec x 10 R/L  Myofacial ball release for R hip flexors prone and R posterior hip in standing using 4 inch ball    [facility] Adult PT Treatment:                                                DATE: 02/26/24 Therapeutic Exercise: Nustep level 6 x 5 minutes UE/LE  Neuromuscular re-ed: Standing hip abduction 2 x 10  Standing hip extension 2 x 10  SLS multiple trials bilaterally; single UE support  Standing resisted march red band; single UE support 2 x 10  Therapeutic Activity: Leg press 2 x 10 with blue band at thighs; 65 lbs  Walking in clinic working on correcting trunk lean and hip hike with and  without heel lift on the RLE     [facility] Adult PT Treatment:                                                DATE: 02/23/24 Therapeutic Exercise: IT band stretch with strap 2  x 30 sec  Prone quad stretch 2 x 30 sec  HEP update   Neuromuscular re-ed: Sidelying hip abduction  2 x 10  Supine bent knee fallout 2 x 10  Supine TA march 2 x 10  SLR 2 x 10   Self Care: Gym equipment recommendations to complete and to avoid                                                                                                         PATIENT EDUCATION:  Education details: HEP review  Person educated: Patient Education method: Explanation Education comprehension: verbalized understanding  HOME EXERCISE PROGRAM: Access Code: [email or web address] URL: [email or web address] Date: 03/02/2024 Prepared by: Yafet Cline  Exercises - Supine Transversus Abdominis Bracing with Pelvic Floor Contraction  - 2 x daily - 7 x weekly - 1 sets - 10 reps - 10sec  hold - Supine Lower Trunk Rotation  - 2 x daily - 7 x weekly - 1 sets - 3-5 reps - 10-20 sec  hold - Supine Bridge  - 2 x daily - 7 x weekly - 1-2 sets - 10 reps - 5-10 sec  hold - Supine Hip External Rotation Stretch  - 1 x daily - 7 x weekly - 3 sets - 20-30 sec  hold - Bent Knee Fallouts  - 1 x daily - 7 x weekly - 2 sets - 10 reps - Sidelying Hip Abduction  - 1 x daily - 7 x weekly - 2 sets - 10 reps - Supine Active Straight Leg Raise  - 1 x daily - 7 x weekly - 2 sets - 10 reps - Hip Flexor Stretch at Edge of Bed  - 2 x daily - 7 x weekly - 1 sets - 3 reps - 30 sec  hold - Standing Piriformis Release with Ball at Wall  - 2 x daily - 7 x weekly - 30-60 sec  hold  ASSESSMENT:  CLINICAL IMPRESSION: Re-eval demonstrated good gains in hip strength. Patient continues to have weakness in R hip. Focused on isolating R hip extension and abduction targeting the glut med. Functional activity level is increasing with decreased pain. Note palpable tightness in  the iliacus and psoas musculature R compared to L. Added psoas stretch in supine and myofacial ball release through the psoas patient in prone and standing. Progressing well toward goals. Will benefit from continued physical therapy skilled treatment.  EVAL: Patient is a 73 y.o. female who was seen today for physical therapy evaluation and treatment for lumbar spondylosis. She has a history of chronic LBP and R > L hip pain following R THA  and revisition 2021. She presents with recent onset of LBP in the past 3 months following an awkward step in [place]. She has poor posture and alignment with standing leg length difference R hemipelvis higher than L; limited trunk and LR mobility, especially R LE; weakness through core and R > L LEs; abnormal gait pattern; muscular tightness to palpation; radicular pain R > L LE; limited functional activity tolerance; pain with activities. Patient will benefit from PT to address problems identified.    GOALS: Goals reviewed with patient? Yes  SHORT TERM GOALS: Target date:  03/11/2024   Independent in initial HEP  Baseline: Goal status: INITIAL  2.  Patient reports 50% improvement in back pain, allowing her to progress with core stabilization and strengthening and LE strengthening Baseline:  Goal status: INITIAL    LONG TERM GOALS: Target date: 04/08/2024   Increase LE strength to 4/5 to 5/5  Baseline:  Goal status: INITIAL  2.  Patient reports ability to stand and walk for 15-20 min with minimal to no increase in pain  Baseline:  Goal status: INITIAL  3.  Patient reports ability to stand from sitting without increased pain  Baseline:  Goal status: INITIAL  4.  Patient reports improved sleep quality - sleeping for 2-4 hours without awakening due to pain in hips or LB  Baseline:  Goal status: INITIAL  5.  PSFS: THE PATIENT SPECIFIC FUNCTIONAL SCALE  Place score of 0-10 (0 = unable to perform activity and 10 = able to perform activity at the  same level as before injury or problem)  Activity Date: 02/12/24    Walking > 5-10 min 2    2. Standing from prolonged sitting  3    3. Sleeping  3    4.      Total Score 8      Total Score = Sum of activity scores/number of activities 8/3 = 2.67  Minimally Detectable Change: 3 points (for single activity); 2 points (for average score) Baseline:  Goal status: INITIAL  6.  Independent in advanced HEP including aquatic program as indicated  Baseline:  Goal status: INITIAL  PLAN:  PT FREQUENCY: 2x/week  PT DURATION: 8 weeks  PLANNED INTERVENTIONS: 97164- PT Re-evaluation, 97110-Therapeutic exercises, 97530- Therapeutic activity, [id number]- Neuromuscular re-education, 97535- Self Care, [id number]- Manual therapy, [phone]- Gait training, [phone]- Aquatic Therapy, Patient/Family education, Balance training, Stair training, Taping, and Joint mobilization.  PLAN FOR NEXT SESSION: review and progress HEP prn; hip strengthening,core stabilization; gait training working on correcting trunk lean and hip hike (likely attributed to weakness?).   Vinh Sachs P. Ina PT, MPH 05/20/24 8:24 AM
